# Patient Record
Sex: Female | Born: 1985 | Race: Black or African American | Hispanic: No | Marital: Single | Smoking: Never smoker
Health system: Southern US, Community
[De-identification: ages and names within clinical notes are randomized; demographics above are authoritative.]

## PROBLEM LIST (undated history)

## (undated) ENCOUNTER — Inpatient Hospital Stay (HOSPITAL_COMMUNITY): Payer: Self-pay

## (undated) DIAGNOSIS — R519 Headache, unspecified: Secondary | ICD-10-CM

## (undated) DIAGNOSIS — E039 Hypothyroidism, unspecified: Secondary | ICD-10-CM

## (undated) DIAGNOSIS — B999 Unspecified infectious disease: Secondary | ICD-10-CM

## (undated) DIAGNOSIS — A749 Chlamydial infection, unspecified: Secondary | ICD-10-CM

## (undated) DIAGNOSIS — Z9889 Other specified postprocedural states: Secondary | ICD-10-CM

## (undated) DIAGNOSIS — R112 Nausea with vomiting, unspecified: Secondary | ICD-10-CM

## (undated) DIAGNOSIS — R51 Headache: Secondary | ICD-10-CM

## (undated) DIAGNOSIS — N750 Cyst of Bartholin's gland: Secondary | ICD-10-CM

## (undated) DIAGNOSIS — E0590 Thyrotoxicosis, unspecified without thyrotoxic crisis or storm: Secondary | ICD-10-CM

## (undated) HISTORY — PX: INDUCED ABORTION: SHX677

## (undated) HISTORY — DX: Thyrotoxicosis, unspecified without thyrotoxic crisis or storm: E05.90

## (undated) HISTORY — PX: DILATION AND CURETTAGE OF UTERUS: SHX78

## (undated) HISTORY — DX: Hypothyroidism, unspecified: E03.9

---
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
## 2007-07-11 ENCOUNTER — Emergency Department (HOSPITAL_COMMUNITY): Admission: EM | Admit: 2007-07-11 | Discharge: 2007-07-11 | Payer: Self-pay | Admitting: Family Medicine

## 2008-03-14 ENCOUNTER — Emergency Department (HOSPITAL_COMMUNITY): Admission: EM | Admit: 2008-03-14 | Discharge: 2008-03-15 | Payer: Self-pay | Admitting: Emergency Medicine

## 2008-03-27 ENCOUNTER — Emergency Department (HOSPITAL_COMMUNITY): Admission: EM | Admit: 2008-03-27 | Discharge: 2008-03-28 | Payer: Self-pay | Admitting: Emergency Medicine

## 2008-03-28 ENCOUNTER — Emergency Department (HOSPITAL_COMMUNITY): Admission: EM | Admit: 2008-03-28 | Discharge: 2008-03-28 | Payer: Self-pay | Admitting: Emergency Medicine

## 2008-04-12 ENCOUNTER — Ambulatory Visit: Payer: Self-pay | Admitting: Obstetrics & Gynecology

## 2008-04-13 ENCOUNTER — Ambulatory Visit (HOSPITAL_COMMUNITY): Admission: RE | Admit: 2008-04-13 | Discharge: 2008-04-13 | Payer: Self-pay | Admitting: Obstetrics & Gynecology

## 2008-05-14 ENCOUNTER — Emergency Department (HOSPITAL_COMMUNITY): Admission: EM | Admit: 2008-05-14 | Discharge: 2008-05-14 | Payer: Self-pay | Admitting: Emergency Medicine

## 2008-06-13 ENCOUNTER — Inpatient Hospital Stay (HOSPITAL_COMMUNITY): Admission: AD | Admit: 2008-06-13 | Discharge: 2008-06-13 | Payer: Self-pay | Admitting: Obstetrics & Gynecology

## 2008-06-21 ENCOUNTER — Ambulatory Visit (HOSPITAL_COMMUNITY): Admission: RE | Admit: 2008-06-21 | Discharge: 2008-06-21 | Payer: Self-pay | Admitting: Family Medicine

## 2008-08-08 ENCOUNTER — Inpatient Hospital Stay (HOSPITAL_COMMUNITY): Admission: AD | Admit: 2008-08-08 | Discharge: 2008-08-08 | Payer: Self-pay | Admitting: Obstetrics and Gynecology

## 2008-08-14 ENCOUNTER — Ambulatory Visit (HOSPITAL_COMMUNITY): Admission: RE | Admit: 2008-08-14 | Discharge: 2008-08-14 | Payer: Self-pay | Admitting: Family Medicine

## 2008-10-26 ENCOUNTER — Inpatient Hospital Stay (HOSPITAL_COMMUNITY): Admission: AD | Admit: 2008-10-26 | Discharge: 2008-10-26 | Payer: Self-pay | Admitting: Family Medicine

## 2008-11-20 ENCOUNTER — Ambulatory Visit: Payer: Self-pay | Admitting: Obstetrics and Gynecology

## 2008-11-20 ENCOUNTER — Inpatient Hospital Stay (HOSPITAL_COMMUNITY): Admission: AD | Admit: 2008-11-20 | Discharge: 2008-11-23 | Payer: Self-pay | Admitting: Obstetrics & Gynecology

## 2009-04-21 IMAGING — US US OB TRANSVAGINAL
1 series · 14 of 28 positions shown · non-contrast
Comparison: none

OBSTETRICAL ULTRASOUND:
 This ultrasound exam was performed in the [HOSPITAL] Ultrasound Department.  The OB US report was generated in the AS system, and faxed to the ordering physician.  This report is also available in [REDACTED] PACS.

[Series 1: us ob transvaginal · 50 acquisitions, 14 frames shown]
[im 2/50]
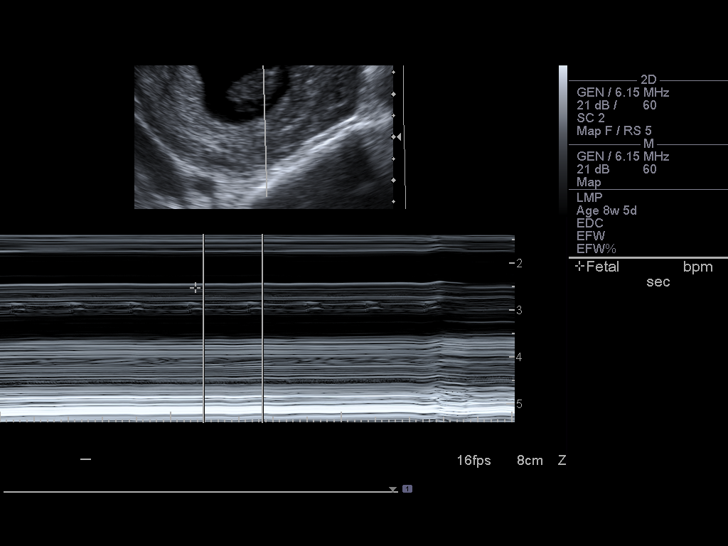
[im 6/50]
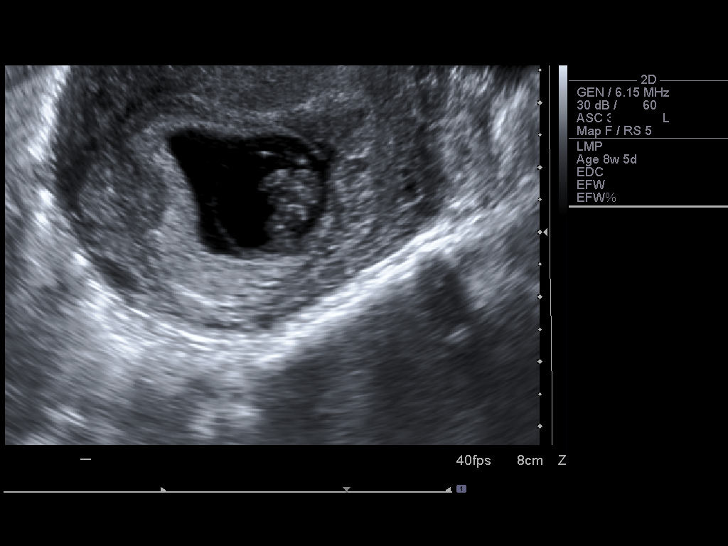
[im 10/50]
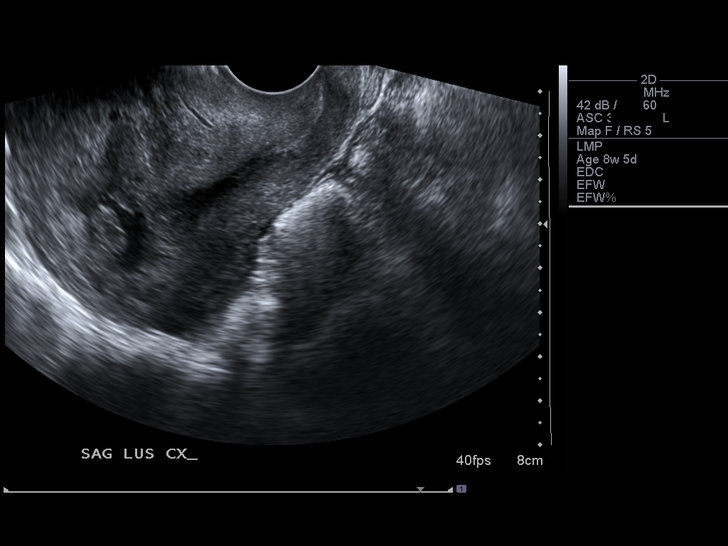
[im 13/50]
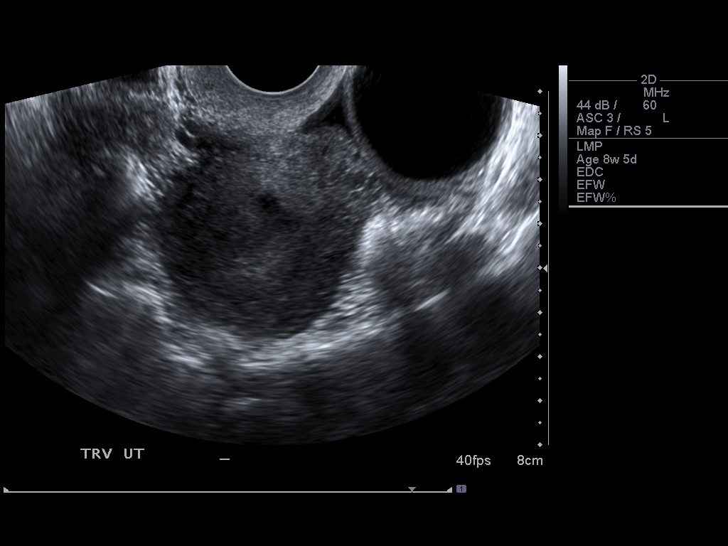
[im 17/50]
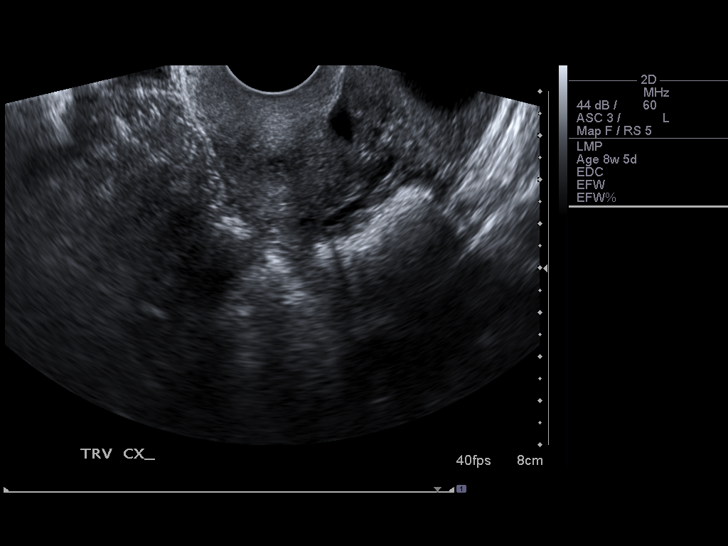
[im 20/50]
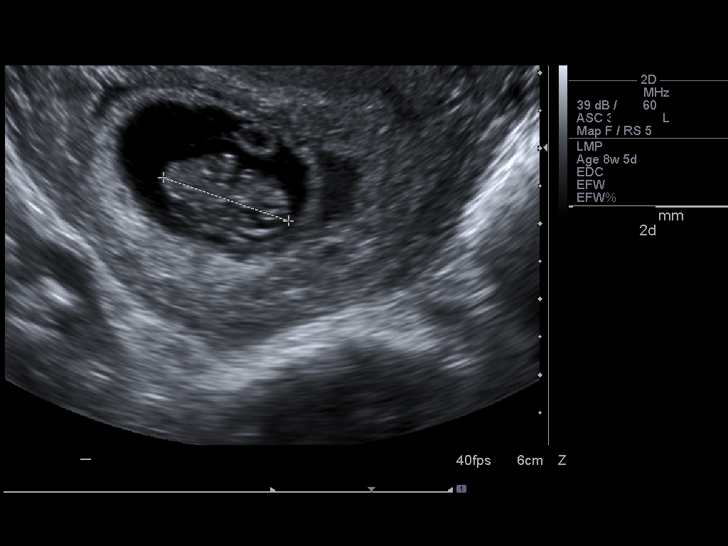
[im 24/50]
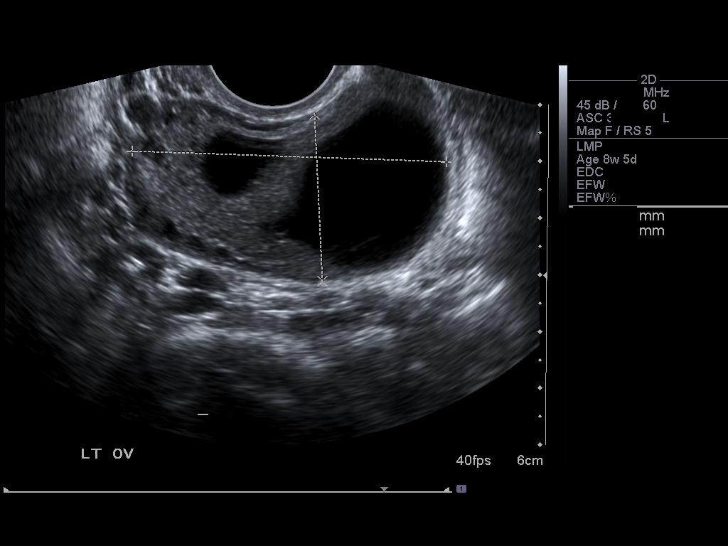
[im 28/50]
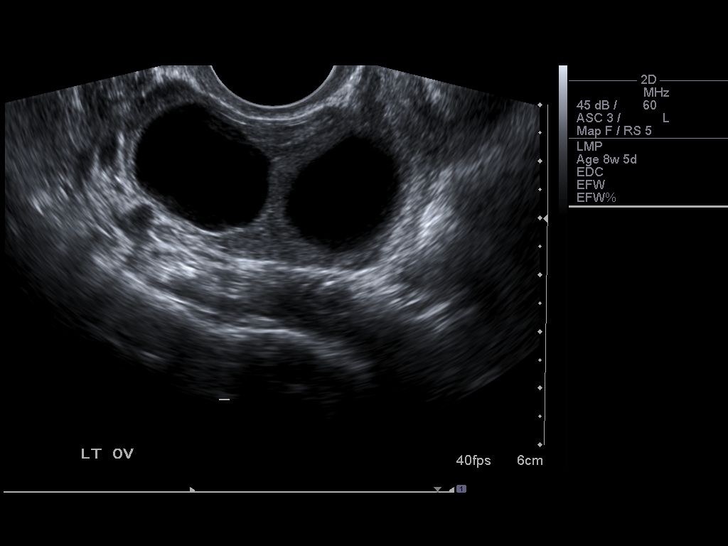
[im 31/50]
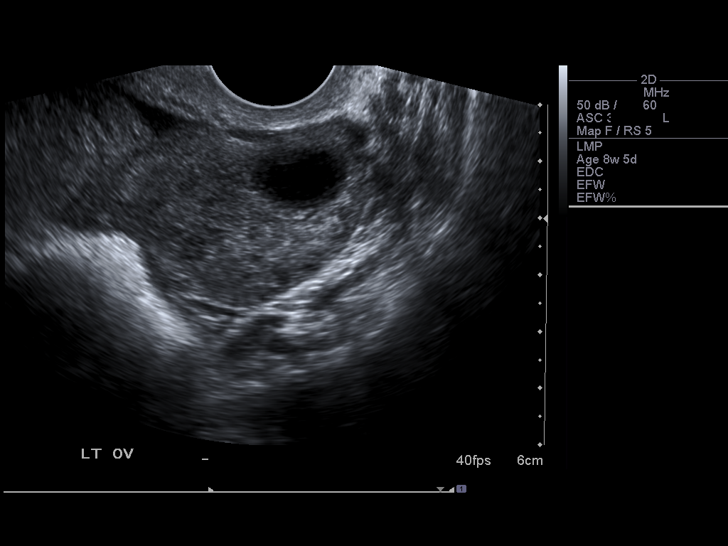
[im 35/50]
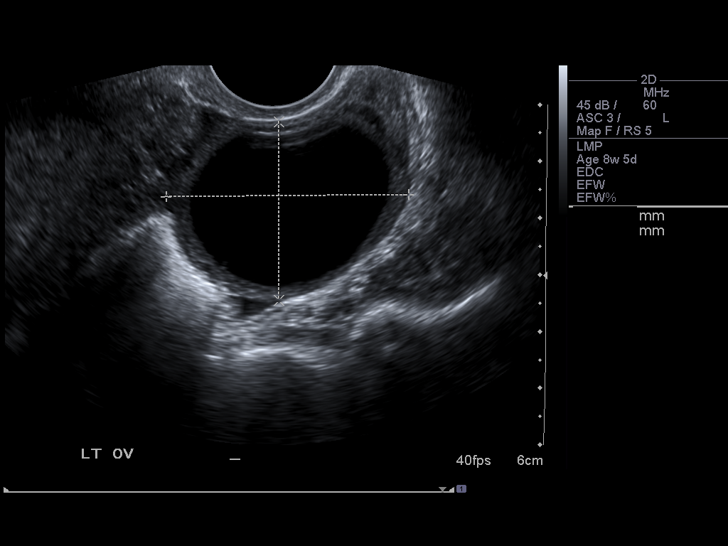
[im 39/50]
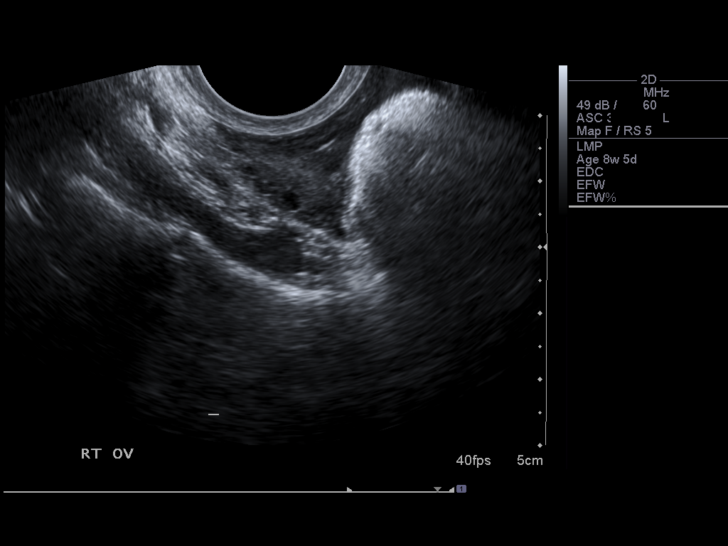
[im 42/50]
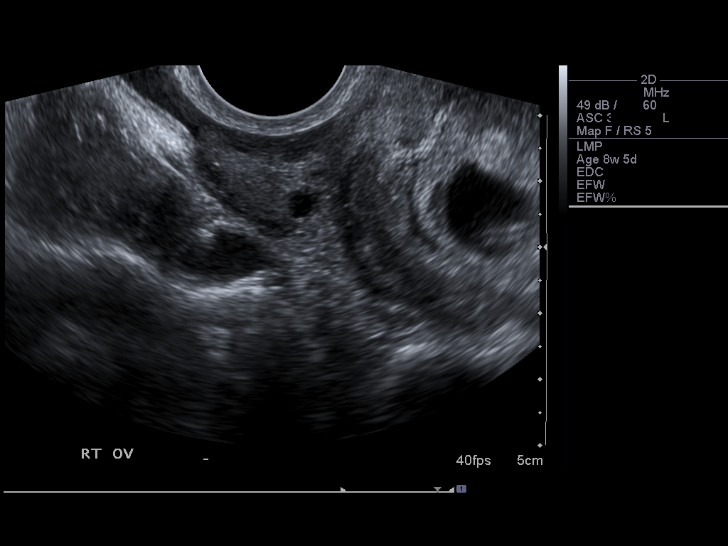
[im 46/50]
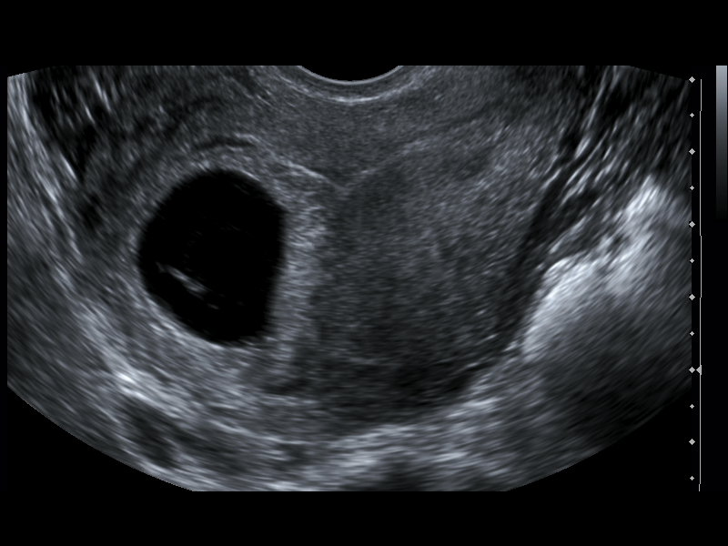
[im 50/50]
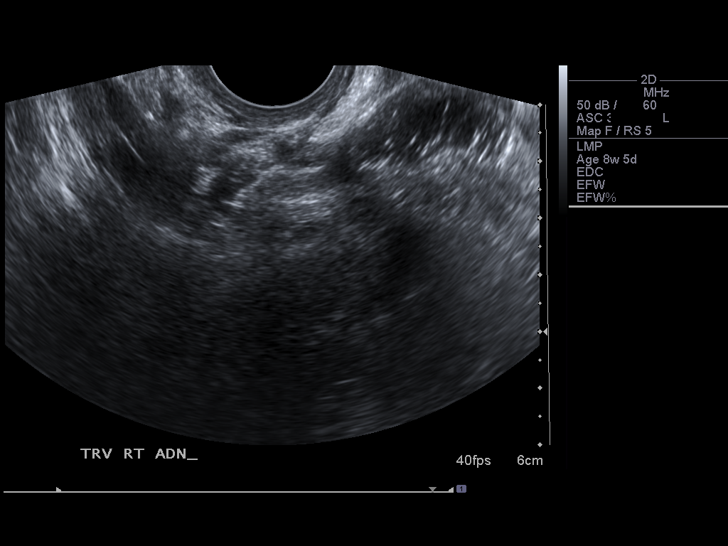

[14 of 28 positions shown; findings below may reference images not displayed]

IMPRESSION: See AS Obstetric US report.

## 2010-01-30 ENCOUNTER — Emergency Department (HOSPITAL_COMMUNITY): Admission: EM | Admit: 2010-01-30 | Discharge: 2010-01-30 | Payer: Self-pay | Admitting: Emergency Medicine

## 2010-03-12 ENCOUNTER — Emergency Department (HOSPITAL_COMMUNITY): Admission: EM | Admit: 2010-03-12 | Discharge: 2010-03-12 | Payer: Self-pay | Admitting: Emergency Medicine

## 2010-08-01 ENCOUNTER — Emergency Department (HOSPITAL_COMMUNITY)
Admission: EM | Admit: 2010-08-01 | Discharge: 2010-08-02 | Payer: Self-pay | Source: Home / Self Care | Admitting: Emergency Medicine

## 2010-09-21 ENCOUNTER — Encounter (HOSPITAL_COMMUNITY): Payer: Self-pay

## 2010-09-21 ENCOUNTER — Emergency Department (HOSPITAL_COMMUNITY): Payer: Self-pay

## 2010-09-21 ENCOUNTER — Emergency Department (HOSPITAL_COMMUNITY)
Admission: EM | Admit: 2010-09-21 | Discharge: 2010-09-22 | Disposition: A | Discharge status: Other Acute Inpatient Hospital | Payer: Self-pay | Attending: Emergency Medicine | Admitting: Emergency Medicine

## 2010-09-21 DIAGNOSIS — R112 Nausea with vomiting, unspecified: Secondary | ICD-10-CM | POA: Insufficient documentation

## 2010-09-21 DIAGNOSIS — O99891 Other specified diseases and conditions complicating pregnancy: Secondary | ICD-10-CM | POA: Insufficient documentation

## 2010-09-21 DIAGNOSIS — R109 Unspecified abdominal pain: Secondary | ICD-10-CM | POA: Insufficient documentation

## 2010-09-21 LAB — DIFFERENTIAL
Basophils Relative: 0 % (ref 0–1)
Lymphs Abs: 1.7 10*3/uL (ref 0.7–4.0)
Monocytes Absolute: 0.8 10*3/uL (ref 0.1–1.0)
Neutro Abs: 10.7 10*3/uL — ABNORMAL HIGH (ref 1.7–7.7)
Neutrophils Relative %: 81 % — ABNORMAL HIGH (ref 43–77)

## 2010-09-21 LAB — URINALYSIS, ROUTINE W REFLEX MICROSCOPIC
Ketones, ur: >80 mg/dL — AB
Protein, ur: NEGATIVE mg/dL
Specific Gravity, Urine: 1.036 — ABNORMAL HIGH (ref 1.005–1.030)
Urine Glucose, Fasting: NEGATIVE mg/dL

## 2010-09-21 LAB — CBC
HCT: 35.8 % — ABNORMAL LOW (ref 36.0–46.0)
MCHC: 35.8 g/dL (ref 30.0–36.0)
MCV: 86.1 fL (ref 78.0–100.0)
Platelets: 258 10*3/uL (ref 150–400)
RBC: 4.16 MIL/uL (ref 3.87–5.11)
RDW: 12.2 % (ref 11.5–15.5)
WBC: 13.2 10*3/uL — ABNORMAL HIGH (ref 4.0–10.5)

## 2010-09-21 LAB — HCG, QUANTITATIVE, PREGNANCY: hCG, Beta Chain, Quant, S: 18108 m[IU]/mL — ABNORMAL HIGH (ref <5)

## 2010-09-21 LAB — HEPATIC FUNCTION PANEL
ALT: 16 U/L (ref 0–35)
Albumin: 4.2 g/dL (ref 3.5–5.2)
Total Protein: 7.5 g/dL (ref 6.0–8.3)

## 2010-09-21 LAB — POCT I-STAT, CHEM 8
Chloride: 104 mEq/L (ref 96–112)
Creatinine, Ser: 0.7 mg/dL (ref 0.4–1.2)
Hemoglobin: 13.6 g/dL (ref 12.0–15.0)
Sodium: 139 mEq/L (ref 135–145)

## 2010-09-21 LAB — LIPASE, BLOOD: Lipase: 16 U/L (ref 11–59)

## 2010-09-21 LAB — POCT PREGNANCY, URINE: Preg Test, Ur: POSITIVE

## 2010-09-21 LAB — WET PREP, GENITAL
Trich, Wet Prep: NONE SEEN
Yeast Wet Prep HPF POC: NONE SEEN

## 2010-09-22 ENCOUNTER — Inpatient Hospital Stay (HOSPITAL_COMMUNITY)
Admission: AD | Admit: 2010-09-22 | Discharge: 2010-09-22 | Disposition: A | Discharge status: Home / Self Care | Payer: Self-pay | Source: Other Acute Inpatient Hospital | Attending: Obstetrics and Gynecology | Admitting: Obstetrics and Gynecology

## 2010-09-22 ENCOUNTER — Inpatient Hospital Stay (HOSPITAL_COMMUNITY): Payer: Self-pay

## 2010-09-22 DIAGNOSIS — R109 Unspecified abdominal pain: Secondary | ICD-10-CM | POA: Insufficient documentation

## 2010-09-22 DIAGNOSIS — O99891 Other specified diseases and conditions complicating pregnancy: Secondary | ICD-10-CM | POA: Insufficient documentation

## 2010-09-22 LAB — URINE CULTURE

## 2010-09-25 ENCOUNTER — Inpatient Hospital Stay (HOSPITAL_COMMUNITY)
Admission: AD | Admit: 2010-09-25 | Discharge: 2010-09-25 | Disposition: A | Discharge status: Home / Self Care | Payer: Self-pay | Source: Ambulatory Visit | Attending: Obstetrics & Gynecology | Admitting: Obstetrics & Gynecology

## 2010-09-25 DIAGNOSIS — O99891 Other specified diseases and conditions complicating pregnancy: Secondary | ICD-10-CM | POA: Insufficient documentation

## 2010-09-25 DIAGNOSIS — O9989 Other specified diseases and conditions complicating pregnancy, childbirth and the puerperium: Secondary | ICD-10-CM

## 2010-09-25 DIAGNOSIS — R109 Unspecified abdominal pain: Secondary | ICD-10-CM | POA: Insufficient documentation

## 2010-09-25 LAB — URINALYSIS, ROUTINE W REFLEX MICROSCOPIC
Bilirubin Urine: NEGATIVE
Ketones, ur: NEGATIVE mg/dL
Specific Gravity, Urine: 1.01 (ref 1.005–1.030)

## 2010-09-25 LAB — URINE MICROSCOPIC-ADD ON

## 2010-09-26 LAB — URINE CULTURE
Colony Count: >=100000
Culture  Setup Time: 201202091258

## 2010-10-27 LAB — URINALYSIS, ROUTINE W REFLEX MICROSCOPIC
Glucose, UA: NEGATIVE mg/dL
Hgb urine dipstick: NEGATIVE
Ketones, ur: NEGATIVE mg/dL
pH: 6 (ref 5.0–8.0)

## 2010-10-27 LAB — WET PREP, GENITAL: Yeast Wet Prep HPF POC: NONE SEEN

## 2010-10-27 LAB — URINE MICROSCOPIC-ADD ON

## 2010-11-01 LAB — BASIC METABOLIC PANEL
BUN: 7 mg/dL (ref 6–23)
Calcium: 9.4 mg/dL (ref 8.4–10.5)
GFR calc non Af Amer: >60 mL/min (ref >60)
Glucose, Bld: 104 mg/dL — ABNORMAL HIGH (ref 70–99)

## 2010-11-01 LAB — DIFFERENTIAL
Eosinophils Absolute: 0 10*3/uL (ref 0.0–0.7)
Eosinophils Relative: 0 % (ref 0–5)
Lymphocytes Relative: 18 % (ref 12–46)
Lymphs Abs: 2.1 10*3/uL (ref 0.7–4.0)
Monocytes Relative: 5 % (ref 3–12)

## 2010-11-01 LAB — HCG, QUANTITATIVE, PREGNANCY: hCG, Beta Chain, Quant, S: 1423 m[IU]/mL — ABNORMAL HIGH (ref <5)

## 2010-11-01 LAB — CBC: MCHC: 34.3 g/dL (ref 30.0–36.0)

## 2010-11-26 LAB — CBC
HCT: 26.9 % — ABNORMAL LOW (ref 36.0–46.0)
HCT: 33.4 % — ABNORMAL LOW (ref 36.0–46.0)
Hemoglobin: 10.6 g/dL — ABNORMAL LOW (ref 12.0–15.0)
Hemoglobin: 11.4 g/dL — ABNORMAL LOW (ref 12.0–15.0)
Hemoglobin: 9.1 g/dL — ABNORMAL LOW (ref 12.0–15.0)
MCHC: 33.8 g/dL (ref 30.0–36.0)
MCHC: 34.1 g/dL (ref 30.0–36.0)
MCHC: 34.3 g/dL (ref 30.0–36.0)
MCV: 93.7 fL (ref 78.0–100.0)
MCV: 94.3 fL (ref 78.0–100.0)
MCV: 95.6 fL (ref 78.0–100.0)
Platelets: 102 10*3/uL — ABNORMAL LOW (ref 150–400)
Platelets: 183 10*3/uL (ref 150–400)
RBC: 3.31 MIL/uL — ABNORMAL LOW (ref 3.87–5.11)
RDW: 12.2 % (ref 11.5–15.5)
RDW: 12.5 % (ref 11.5–15.5)
RDW: 12.6 % (ref 11.5–15.5)
RDW: 12.9 % (ref 11.5–15.5)

## 2010-11-26 LAB — DIFFERENTIAL
Basophils Absolute: 0 10*3/uL (ref 0.0–0.1)
Basophils Relative: 0 % (ref 0–1)
Eosinophils Absolute: 0 10*3/uL (ref 0.0–0.7)
Monocytes Absolute: 0 10*3/uL — ABNORMAL LOW (ref 0.1–1.0)
Monocytes Relative: 0 % — ABNORMAL LOW (ref 3–12)
Neutro Abs: 2.9 10*3/uL (ref 1.7–7.7)
Neutrophils Relative %: 92 % — ABNORMAL HIGH (ref 43–77)

## 2010-11-26 LAB — RH IMMUNE GLOB WKUP(>/=20WKS)(NOT WOMEN'S HOSP): Fetal Screen: NEGATIVE

## 2010-12-30 NOTE — Op Note (Signed)
Joyce Rice, Joyce Rice NO.:  [id number]   MEDICAL RECORD NO.:  [id number]          PATIENT TYPE:  INP   LOCATION:  9117                          FACILITY:  [facility]   PHYSICIAN:  Tilda Burrow, M.D. DATE OF BIRTH:  1986-02-21   DATE OF PROCEDURE:  11/21/2008  DATE OF DISCHARGE:                               OPERATIVE REPORT   I called to see the patient regarding prolonged fetal heart rate  decelerations with recovery after approximately 3 minutes.  The patient  was counseled over by Ms. Williams regarding vacuum assistance and  confirmed by me.  The patient was examined and found to be in a right  occipito-anterior position and Foley catheter removed.  Vacuum  assistance with Kiwi vacuum was offered and accepted by the patient and  placed on the vertex with careful positioning.  Vacuum assistance was  applied upon the onset of the contraction to thegreen line and vacuum  needle utilized through one full contraction, two prolonged pushes by  the patient who did most of the work.  The baby crowned nicely and  delivered over a second degree midline laceration.  Once the head was  delivered, the vacuum was removed.  There had been no pop-offs.  The  shoulders were checked and the baby was found to be in a transverse  position with the shoulders.  The infant's right shoulder was identified  and while the mother pushed the shoulders rotated so that the right arm  could be released from an anterior position and the rest of the baby  delivered easily.  The infant was placed on the maternal abdomen, cord  clamped and then cut by the baby's father who was in attendance.  Placenta was delivered by Crede uterine massage after approximately 5  minutes.  Apgars were __________ .  Weight was __________ .  The patient  had local anesthesia infiltrated to ensure effective analgesia.  The  periurethral first degree lacerations did not require repair.  The  secondary  midline  laceration was repaired using 3-0 Monocryl in a  continuous running two-layer fashion.  Just before completing the end of  the closure, it was noted that purulent material was encountered with a  last stitch coming from the patient's left side.  The needle was cut  off, the suture unwound from the last suture placement on the left side,  so that the suturing did not involve the Bartholin cyst and then the  episiotomy tied down in standard fashion with good tissue approximation.   Incision and drainage of the Bartholin cyst then followed.  Palpation  revealed poorly defined fullness on the left side, which was obviously  from the Bartholin cyst that was relatively asymptomatic but contained  purulent material.  A 5 mL of lidocaine was injected into the body of  the Bartholin cyst in order to improve its visibility.  It was clearly  definable at that point and so under a wheal of local anesthesia at the  left hymen remnant, a stab incision with #11 blade was made into the  cyst, expressing purulence and an old  coagulated debris.  This was  expressed and Kelly clamp could be used to probe the depth of the cyst.  Word catheter was tested, inflated, and inserted and 1 mL of lidocaine  injected into the Word catheter to keep it in place.  The catheter  rested comfortably and slipped up into the vagina in standard position.  The patient tolerated the procedure well.  Upon completion of the  procedure, we were temporarily unable to locate the needle from the 3-0  Monocryl and we were able to identify it in the draping.  Rest of the  sponges were accounted for as well as the remainder of the sharps.  The  patient was in stable condition with EBL of 350 mL.      Tilda Burrow, M.D.  Electronically Signed     JVF/MEDQ  D:  11/21/2008  T:  11/22/2008  Job:  [id number]

## 2010-12-30 NOTE — Group Therapy Note (Signed)
Joyce Rice, Joyce Rice NO.:  [id number]   MEDICAL RECORD NO.:  [id number]          PATIENT TYPE:  WOC   LOCATION:  [facility]                   FACILITY:  [facility]   PHYSICIAN:  Johnella Moloney, MD        DATE OF BIRTH:  10/10/1985   DATE OF SERVICE:                                  CLINIC NOTE   The patient is a 25 year old gravida 1 with last menstrual period of  February 12, 2008, who was seen in the [facility] Emergency Room on March 28, 2008, for abdominal pain.  Subsequent evaluation revealed that the  patient had a positive pregnancy test with quantitative HCG level of  9499.  A follow-up ultrasound was ordered and was remarkable for a  cystic structure within the uterus compatible with a gestational sac,  but no embryo or yolk sac was identified.  Findings could be due to  early pregnancy or fetal demise.  Follow-up ultrasound was recommended.  The patient was told to follow up here at the GYN clinic for further  evaluation.  Of note, the patient just had the 1 episode of abdominal  pain on March 28, 2008.  She has had no bleeding.  On evaluation, she  was noted to have an O negative blood type and did receive RhoGAM 50 mcg  IM on March 28, 2008.  She had no medical problems, no surgeries, and  no other acute gynecological issues.  Her last Pap smear was done on  March 22, 2008, and was normal.   On encounter today, the patient denies any abdominal pain or bleeding.  She does seem confused about what her diagnosis is at this point.  The  patient was told that at the quantitative HCG level that it is expected  that there should be more than a gestational sac identified on the  ultrasound.  However, early normal pregnancy cannot be excluded.  Given  this, we will repeat quantitative HCG level today and order another  ultrasound.  If these studies do point to missed abortion, the patient  was told that she will be offered misoprostol versus a D&C for  management of  this.  However, we will follow up with ultrasound studies  and a beta HCG level before these discussions are had in detail.  The  patient will follow up within 1 week for discussion of results and  further evaluation.           ______________________________  Johnella Moloney, MD     UD/MEDQ  D:  04/12/2008  T:  04/12/2008  Job:  [id number]

## 2011-05-15 LAB — URINALYSIS, ROUTINE W REFLEX MICROSCOPIC
Bilirubin Urine: NEGATIVE
Glucose, UA: NEGATIVE
Hgb urine dipstick: NEGATIVE
Ketones, ur: NEGATIVE
Protein, ur: NEGATIVE

## 2011-05-15 LAB — HCG, QUANTITATIVE, PREGNANCY: hCG, Beta Chain, Quant, S: 9499 — ABNORMAL HIGH

## 2011-05-15 LAB — RHOGAM INJECTION

## 2011-05-15 LAB — URINE MICROSCOPIC-ADD ON

## 2011-05-15 LAB — ABO/RH: ABO/RH(D): O NEG

## 2011-05-18 LAB — URINALYSIS, ROUTINE W REFLEX MICROSCOPIC
Bilirubin Urine: NEGATIVE
Glucose, UA: NEGATIVE
Protein, ur: 100 — AB

## 2011-05-18 LAB — WET PREP, GENITAL: Clue Cells Wet Prep HPF POC: NONE SEEN

## 2011-05-18 LAB — URINE MICROSCOPIC-ADD ON

## 2011-05-18 LAB — GC/CHLAMYDIA PROBE AMP, GENITAL
Chlamydia, DNA Probe: NEGATIVE
GC Probe Amp, Genital: NEGATIVE

## 2011-05-22 LAB — URINALYSIS, ROUTINE W REFLEX MICROSCOPIC
Bilirubin Urine: NEGATIVE
Hgb urine dipstick: NEGATIVE
Ketones, ur: NEGATIVE mg/dL
Nitrite: NEGATIVE
Protein, ur: 30 mg/dL — AB
Specific Gravity, Urine: 1.015 (ref 1.005–1.030)
Urobilinogen, UA: 0.2 mg/dL (ref 0.0–1.0)

## 2011-05-22 LAB — WET PREP, GENITAL
Clue Cells Wet Prep HPF POC: NONE SEEN
Trich, Wet Prep: NONE SEEN
Yeast Wet Prep HPF POC: NONE SEEN

## 2011-05-22 LAB — GC/CHLAMYDIA PROBE AMP, GENITAL
Chlamydia, DNA Probe: NEGATIVE
GC Probe Amp, Genital: NEGATIVE

## 2011-05-26 LAB — WET PREP, GENITAL
Clue Cells Wet Prep HPF POC: NONE SEEN
Trich, Wet Prep: NONE SEEN
WBC, Wet Prep HPF POC: NONE SEEN
Yeast Wet Prep HPF POC: NONE SEEN

## 2011-05-26 LAB — POCT URINALYSIS DIP (DEVICE)
Glucose, UA: NEGATIVE
Ketones, ur: 15 — AB
Nitrite: NEGATIVE
Operator id: 116391
Protein, ur: 30 — AB
Specific Gravity, Urine: 1.02
Urobilinogen, UA: 2 — ABNORMAL HIGH
pH: 5.5

## 2011-05-26 LAB — GC/CHLAMYDIA PROBE AMP, GENITAL
Chlamydia, DNA Probe: NEGATIVE
GC Probe Amp, Genital: NEGATIVE

## 2011-05-26 LAB — POCT PREGNANCY, URINE
Operator id: 116391
Preg Test, Ur: NEGATIVE

## 2011-06-19 ENCOUNTER — Encounter (HOSPITAL_COMMUNITY): Payer: Self-pay | Admitting: *Deleted

## 2011-06-19 ENCOUNTER — Inpatient Hospital Stay (HOSPITAL_COMMUNITY)
Admission: AD | Admit: 2011-06-19 | Discharge: 2011-06-19 | Disposition: A | Discharge status: Home / Self Care | Payer: Medicaid Other | Source: Ambulatory Visit | Attending: Obstetrics and Gynecology | Admitting: Obstetrics and Gynecology

## 2011-06-19 DIAGNOSIS — N926 Irregular menstruation, unspecified: Secondary | ICD-10-CM | POA: Insufficient documentation

## 2011-06-19 DIAGNOSIS — N760 Acute vaginitis: Secondary | ICD-10-CM | POA: Insufficient documentation

## 2011-06-19 DIAGNOSIS — A499 Bacterial infection, unspecified: Secondary | ICD-10-CM | POA: Insufficient documentation

## 2011-06-19 DIAGNOSIS — B9689 Other specified bacterial agents as the cause of diseases classified elsewhere: Secondary | ICD-10-CM | POA: Insufficient documentation

## 2011-06-19 DIAGNOSIS — R109 Unspecified abdominal pain: Secondary | ICD-10-CM | POA: Insufficient documentation

## 2011-06-19 HISTORY — DX: Cyst of Bartholin's gland: N75.0

## 2011-06-19 LAB — URINALYSIS, ROUTINE W REFLEX MICROSCOPIC
Bilirubin Urine: NEGATIVE
Hgb urine dipstick: NEGATIVE
Nitrite: NEGATIVE
Protein, ur: NEGATIVE mg/dL
Specific Gravity, Urine: >1.03 — ABNORMAL HIGH (ref 1.005–1.030)
Urobilinogen, UA: 0.2 mg/dL (ref 0.0–1.0)

## 2011-06-19 LAB — URINE MICROSCOPIC-ADD ON

## 2011-06-19 LAB — WET PREP, GENITAL: Yeast Wet Prep HPF POC: NONE SEEN

## 2011-06-19 MED ORDER — METRONIDAZOLE 500 MG PO TABS: 500.0000 mg | ORAL_TABLET | Freq: Two times a day (BID) | ORAL | Status: AC

## 2011-06-19 MED ORDER — METRONIDAZOLE 500 MG PO TABS: 500.0000 mg | ORAL_TABLET | Freq: Two times a day (BID) | ORAL | Status: DC

## 2011-06-19 NOTE — ED Provider Notes (Signed)
[place] Hinson25 [id number] @Unknown  Chief Complaint  Patient presents with  . Abdominal Pain  . Back Pain    SUBJECTIVE  HPI: Concerned that she missed menses 06/04/11 at end of 3rd week of Loestrin pack. Did not miss pills but did have some vomiting during Oct. Menses light and regular since starting last March. HPT was neg. Since the time of her missed period, she reports intermittent sharp suprapubic pains that radiate to her back. She is not having pain now. Last intercourse was early in October. Denies urinary frequency, urgency, hematuria, dysuria. Denies irritating vaginal discharge.    Past Medical History  Diagnosis Date  . Bartholin cyst     Past Surgical History  Procedure Date  . Dilation and curettage of uterus   . No past surgeries    History   Social History  . Marital Status: Single    Spouse Name: N/A    Number of Children: N/A  . Years of Education: N/A   Occupational History  . Not on file.   Social History Main Topics  . Smoking status: Never Smoker   . Smokeless tobacco: Not on file  . Alcohol Use: No  . Drug Use: No  . Sexually Active: Yes    Birth Control/ Protection: Pill   Other Topics Concern  . Not on file   Social History Narrative  . No narrative on file   No current facility-administered medications on file prior to encounter.   No current outpatient prescriptions on file prior to encounter.   No Known Allergies  ROS: Pertinent items in HPI  OBJECTIVE  BP 116/67  Temp(Src) 98.5 F (36.9 C) (Oral)  Resp 20  Ht 5' 1.5" (1.562 m)  Wt 63.135 kg (139 lb 3 oz)  BMI 25.87 kg/m2  LMP 05/05/2011  Breastfeeding? No   Physical Exam  Constitutional: She is oriented to person, place, and time and well-developed, well-nourished, and in no distress. No distress.  HENT:  Head: Normocephalic.  Eyes: Pupils are equal, round, and reactive to light.  Neck: Neck supple. No thyromegaly present.  Abdominal: Soft. She exhibits no  distension. There is no tenderness. There is no rebound and no guarding.  Genitourinary: Uterus normal, cervix normal, right adnexa normal and left adnexa normal. Vaginal discharge found.  Neurological: She is alert and oriented to person, place, and time.  Skin: Skin is warm and dry.    Results for orders placed during the hospital encounter of 06/19/11 (from the past 24 hour(s))  URINALYSIS, ROUTINE W REFLEX MICROSCOPIC     Status: Abnormal   Collection Time   06/19/11  9:20 PM      Component Value Range   Color, Urine YELLOW  YELLOW    Appearance CLEAR  CLEAR    Specific Gravity, Urine >1.030 (*) 1.005 - 1.030    pH 5.5  5.0 - 8.0    Glucose, UA NEGATIVE  NEGATIVE (mg/dL)   Hgb urine dipstick NEGATIVE  NEGATIVE    Bilirubin Urine NEGATIVE  NEGATIVE    Ketones, ur 15 (*) NEGATIVE (mg/dL)   Protein, ur NEGATIVE  NEGATIVE (mg/dL)   Urobilinogen, UA 0.2  0.0 - 1.0 (mg/dL)   Nitrite NEGATIVE  NEGATIVE    Leukocytes, UA TRACE (*) NEGATIVE   URINE MICROSCOPIC-ADD ON     Status: Abnormal   Collection Time   06/19/11  9:20 PM      Component Value Range   Squamous Epithelial / LPF FEW (*) RARE  WBC, UA 0-2  <3 (WBC/hpf)   Bacteria, UA RARE  RARE    Urine-Other MUCOUS PRESENT    POCT PREGNANCY, URINE     Status: Normal   Collection Time   06/19/11  9:22 PM      Component Value Range   Preg Test, Ur NEGATIVE    WET PREP, GENITAL     Status: Abnormal   Collection Time   06/19/11  9:55 PM      Component Value Range   Yeast, Wet Prep NONE SEEN  NONE SEEN    Trich, Wet Prep NONE SEEN  NONE SEEN    Clue Cells, Wet Prep MODERATE (*) NONE SEEN    WBC, Wet Prep HPF POC MODERATE (*) NONE SEEN    ASSESSMENT  Irregular menstrual cycle BV  PLAN Rx Flagyl She elects to remain abstinent until physiologic onset of menses. We will make an appointment in GYN clinic in 4 weeks and she can cancel if she has menses and is back on OCPs.

## 2011-06-19 NOTE — Progress Notes (Signed)
Written and verbal d/c instructions given and understanding voiced. Has script for Flagyl

## 2011-06-19 NOTE — Progress Notes (Signed)
Pt states, " I missed my period on Oct 18th, and ever since then I've had pain in my lower abdomen and mid and low back."

## 2011-06-19 NOTE — ED Notes (Signed)
Pt vomited. States thinks it's due to her not eating recently. She worked last pm, ate alittle this am, slept and then ate few pretzels when she got up. Saltines to pt. Had been drinking diet ginger ale earlier. States feels better now.

## 2011-06-19 NOTE — Progress Notes (Signed)
D. Poe CNM in to discuss wet prep results and plan for d/c to home.

## 2011-06-20 LAB — GC/CHLAMYDIA PROBE AMP, GENITAL: GC Probe Amp, Genital: NEGATIVE

## 2011-08-05 ENCOUNTER — Encounter: Payer: Medicaid Other | Admitting: Advanced Practice Midwife

## 2011-09-03 ENCOUNTER — Ambulatory Visit (INDEPENDENT_AMBULATORY_CARE_PROVIDER_SITE_OTHER): Payer: Medicaid Other | Admitting: Obstetrics and Gynecology

## 2011-09-03 ENCOUNTER — Encounter: Payer: Self-pay | Admitting: Obstetrics and Gynecology

## 2011-09-03 ENCOUNTER — Other Ambulatory Visit (HOSPITAL_COMMUNITY)
Admission: RE | Admit: 2011-09-03 | Discharge: 2011-09-03 | Disposition: A | Discharge status: Home / Self Care | Payer: Medicaid Other | Source: Ambulatory Visit | Attending: Obstetrics and Gynecology | Admitting: Obstetrics and Gynecology

## 2011-09-03 DIAGNOSIS — Z3042 Encounter for surveillance of injectable contraceptive: Secondary | ICD-10-CM

## 2011-09-03 DIAGNOSIS — N760 Acute vaginitis: Secondary | ICD-10-CM

## 2011-09-03 DIAGNOSIS — Z309 Encounter for contraceptive management, unspecified: Secondary | ICD-10-CM

## 2011-09-03 DIAGNOSIS — Z01419 Encounter for gynecological examination (general) (routine) without abnormal findings: Secondary | ICD-10-CM | POA: Insufficient documentation

## 2011-09-03 DIAGNOSIS — Z3049 Encounter for surveillance of other contraceptives: Secondary | ICD-10-CM

## 2011-09-03 DIAGNOSIS — B9689 Other specified bacterial agents as the cause of diseases classified elsewhere: Secondary | ICD-10-CM | POA: Insufficient documentation

## 2011-09-03 DIAGNOSIS — A499 Bacterial infection, unspecified: Secondary | ICD-10-CM

## 2011-09-03 DIAGNOSIS — Z113 Encounter for screening for infections with a predominantly sexual mode of transmission: Secondary | ICD-10-CM | POA: Insufficient documentation

## 2011-09-03 LAB — WET PREP, GENITAL: WBC, Wet Prep HPF POC: NONE SEEN

## 2011-09-03 MED ORDER — MEDROXYPROGESTERONE ACETATE 150 MG/ML IM SUSP
150.0000 mg | Freq: Once | INTRAMUSCULAR | Status: AC
  Administered 2011-09-03: 150 mg via INTRAMUSCULAR

## 2011-09-03 NOTE — Progress Notes (Signed)
Joyce Hinson25 [id number] Chief Complaint  Patient presents with  . Menstrual Problem    referral MAU  . Contraception    SUBJECTIVE  HPI: Seen in MAU 06/19/11 due to a single late menses while on Loestrin. Has difficulty remembering to take pills and would like to change to Depoprovera. Had spontaneous onset menses and normal flow 08/23/10 and has begun a new pack of Loestrin on approx  Day4 of pill pack.  BV treated with Flagyl 06/19/11. Also has some vaginal discharge and would like to make sure the BV is resolved.   Past Medical History  Diagnosis Date  . Bartholin cyst   . Irregular periods/menstrual cycles    Past Surgical History  Procedure Date  . Dilation and curettage of uterus   . No past surgeries    History   Social History  . Marital Status: Single    Spouse Name: N/A    Number of Children: N/A  . Years of Education: N/A   Occupational History  . Not on file.   Social History Main Topics  . Smoking status: Never Smoker   . Smokeless tobacco: Never Used  . Alcohol Use: No  . Drug Use: No  . Sexually Active: Yes    Birth Control/ Protection: Pill   Other Topics Concern  . Not on file   Social History Narrative  . No narrative on file   No current outpatient prescriptions on file prior to visit.   No current facility-administered medications on file prior to visit.   No Known Allergies  ROS: Pertinent items in HPI  OBJECTIVE  BP 119/72  Pulse 78  Temp(Src) 97.7 F (36.5 C) (Oral)  Ht 5\' 2"  (1.575 m)  Wt 138 lb 12.8 oz (62.959 kg)  BMI 25.39 kg/m2  LMP 08/24/2011    ASSESSMENT Contraceptive management F/U BV    PLAN WP. Pap/GC/CT sent. If UPT neg will initiate Depoprovera today. Counseled on irregular spotting that may occur. Use condoms for safe sex.

## 2011-09-03 NOTE — Progress Notes (Signed)
States is due for a pap

## 2011-09-03 NOTE — Patient Instructions (Signed)
Place depot medroxyprogesterone acetate injection patient instructions here.  Return every 3 months (12 wks for injection. Use condoms for

## 2011-09-05 ENCOUNTER — Telehealth: Payer: Self-pay | Admitting: Obstetrics and Gynecology

## 2011-09-25 NOTE — Telephone Encounter (Signed)
See TC documentatioin

## 2011-10-21 ENCOUNTER — Telehealth: Payer: Self-pay | Admitting: *Deleted

## 2011-10-21 NOTE — Telephone Encounter (Signed)
Pt left message requesting to speak to Joyce Rice regarding her birth control.

## 2011-10-21 NOTE — Telephone Encounter (Signed)
Returned call to pt and she wanted to know why she has not had a period since her Depo Provera injection was given.  I explained that this was normal. I also stated that it is possible to still have a period or light period as well. Pt voiced understanding. Pt has next injection appt on 11/19/11.

## 2011-11-19 ENCOUNTER — Ambulatory Visit: Payer: Medicaid Other

## 2011-11-30 ENCOUNTER — Ambulatory Visit (INDEPENDENT_AMBULATORY_CARE_PROVIDER_SITE_OTHER): Payer: Medicaid Other | Admitting: Obstetrics and Gynecology

## 2011-11-30 VITALS — BP 117/69 | HR 62 | Ht 62.0 in | Wt 140.2 lb

## 2011-11-30 DIAGNOSIS — Z3049 Encounter for surveillance of other contraceptives: Secondary | ICD-10-CM

## 2011-11-30 MED ORDER — MEDROXYPROGESTERONE ACETATE 150 MG/ML IM SUSP
150.0000 mg | INTRAMUSCULAR | Status: AC
  Administered 2011-11-30 – 2012-02-15 (×2): 150 mg via INTRAMUSCULAR

## 2011-12-29 ENCOUNTER — Encounter (HOSPITAL_COMMUNITY): Payer: Self-pay | Admitting: *Deleted

## 2011-12-29 ENCOUNTER — Inpatient Hospital Stay (HOSPITAL_COMMUNITY)
Admission: AD | Admit: 2011-12-29 | Discharge: 2011-12-29 | Disposition: A | Discharge status: Home / Self Care | Payer: Medicaid Other | Source: Ambulatory Visit | Attending: Obstetrics & Gynecology | Admitting: Obstetrics & Gynecology

## 2011-12-29 DIAGNOSIS — A749 Chlamydial infection, unspecified: Secondary | ICD-10-CM | POA: Insufficient documentation

## 2011-12-29 DIAGNOSIS — N949 Unspecified condition associated with female genital organs and menstrual cycle: Secondary | ICD-10-CM | POA: Insufficient documentation

## 2011-12-29 DIAGNOSIS — N899 Noninflammatory disorder of vagina, unspecified: Secondary | ICD-10-CM

## 2011-12-29 DIAGNOSIS — N898 Other specified noninflammatory disorders of vagina: Secondary | ICD-10-CM

## 2011-12-29 HISTORY — DX: Chlamydial infection, unspecified: A74.9

## 2011-12-29 LAB — WET PREP, GENITAL
Trich, Wet Prep: NONE SEEN
Yeast Wet Prep HPF POC: NONE SEEN

## 2011-12-29 LAB — POCT PREGNANCY, URINE: Preg Test, Ur: NEGATIVE

## 2011-12-29 MED ORDER — TERCONAZOLE 0.4 % VA CREA: 1.0000 | TOPICAL_CREAM | Freq: Every day | VAGINAL | Status: AC

## 2011-12-29 NOTE — Discharge Instructions (Signed)
Vaginitis Vaginitis is an infection. It causes soreness, swelling, and redness (inflammation) of the vagina. Many of these infections are sexually transmitted diseases (STDs). Having unprotected sex can cause further problems and complications such as:  Chronic pelvic pain.   Infertility.   Unwanted pregnancy.   Abortion.   Tubal pregnancy.   Infection passed on to the newborn.   Cancer.  CAUSES   Monilia. This is a yeast or fungus infection, not an STD.   Bacterial vaginosis. The normal balance of bacteria in the vagina is disrupted and is replaced by an overgrowth of certain bacteria.   Gonorrhea, chlamydia. These are bacterial infections that are STDs.   Vaginal sponges, diaphragms, and intrauterine devices.   Trichomoniasis. This is a STD infection caused by a parasite.   Viruses like herpes and human papillomavirus. Both are STDs.   Pregnancy.   Immunosuppression. This occurs with certain conditions such as HIV infection or cancer.   Using bubble bath.   Taking certain antibiotic medicines.   Sporadic recurrence can occur if you become sick.   Diabetes.   Steroids.   Allergic reaction. If you have an allergy to:   Douches.   Soaps.   Spermicides.   Condoms.   Scented tampons or vaginal sprays.  SYMPTOMS   Abnormal vaginal discharge.   Itching of the vagina.   Pain in the vagina.   Swelling of the vagina.  In some cases, there are no symptoms. TREATMENT  Treatment will vary depending on the type of infection.  Bacteria or trichomonas are usually treated with oral antibiotics and sometimes vaginal cream or suppositories.   Monilia vaginitis is usually treated with vaginal creams, suppositories, or oral antifungal pills.   Viral vaginitis has no cure. However, the symptoms of herpes (a viral vaginitis) can be treated to relieve the discomfort. Human papillomavirus has no symptoms. However, there are treatments for the diseases caused by human  papillomavirus.   With allergic vaginitis, you need to stop using the product that is causing the problem. Vaginal creams can be used to treat the symptoms.   When treating an STD, the sex partner should also be treated.  HOME CARE INSTRUCTIONS   Take all the medicines as directed by your caregiver.   Do not use scented tampons, soaps, or vaginal sprays.   Do not douche.   Tell your sex partner if you have a vaginal infection or an STD.   Do not have sexual intercourse until you have treated the vaginitis.   Practice safe sex by using condoms.  SEEK MEDICAL CARE IF:   You have abdominal pain.   Your symptoms get worse during treatment.  Document Released: 05/31/2007 Document Revised: 07/23/2011 Document Reviewed: 01/24/2009 Exit[employer] Patient Information 2012 [employer]. 

## 2011-12-29 NOTE — MAU Note (Signed)
Last depo injection in April.

## 2011-12-29 NOTE — MAU Provider Note (Signed)
Attestation of Attending Supervision of Advanced Practitioner: Evaluation and management procedures were performed by the [facility] Fellow/PA/CNM/NP under my supervision and collaboration. Chart reviewed, and agree with management and plan.  Jaynie Collins, M.D. 12/29/2011 5:51 PM

## 2011-12-29 NOTE — MAU Note (Signed)
Vaginal irritation - was itching last week, now irritation & discharge.  Denies bleeding.

## 2011-12-29 NOTE — MAU Provider Note (Signed)
History     CSN: [id number]  Arrival date and time: 12/29/11 1322   First Provider Initiated Contact with Patient 12/29/11 1347      Chief Complaint  Patient presents with  . Vaginal Discharge   HPI Joyce Rice is 26 y.o. [phone] presents with vaginal discharge and vaginal irritation.  LMP ?  On DEPO.  Sexually active with one partner X 5 years.  Wants STI screening.  Denies vaginal bleeding.    Past Medical History  Diagnosis Date  . Bartholin cyst   . Irregular periods/menstrual cycles     Past Surgical History  Procedure Date  . Dilation and curettage of uterus   . No past surgeries     History reviewed. No pertinent family history.  History  Substance Use Topics  . Smoking status: Never Smoker   . Smokeless tobacco: Never Used  . Alcohol Use: No    Allergies: No Known Allergies  Prescriptions prior to admission  Medication Sig Dispense Refill  . Norethindrone-Ethinyl Estradiol-Fe (GENERESS FE) 0.8-25 MG-MCG tablet Chew 1 tablet by mouth daily.        Review of Systems  Constitutional: Negative.   HENT:       "tickle" in throat.  Denies pain or exudate  Respiratory: Negative.   Cardiovascular: Negative.   Gastrointestinal: Negative for abdominal pain.  Genitourinary:       + for vaginal discharge and irritation   Physical Exam   Blood pressure 113/65, pulse 66, temperature 98.9 F (37.2 C), temperature source Oral, resp. rate 18, height 5' 0.5" (1.537 m), weight 63.231 kg (139 lb 6.4 oz), unknown if currently breastfeeding.  Physical Exam  Constitutional: She is oriented to person, place, and time. She appears well-developed and well-nourished. No distress.  HENT:  Head: Normocephalic.  Neck: Normal range of motion.  Cardiovascular: Normal rate.   Respiratory: Effort normal.  GI: Soft. She exhibits no mass. There is no tenderness. There is no rebound and no guarding.  Genitourinary: There is injury on the right labia. There is no tenderness  or lesion on the right labia. There is no tenderness or lesion on the left labia. Uterus is not enlarged and not tender. Cervix exhibits friability. Cervix exhibits no discharge. Right adnexum displays no mass, no tenderness and no fullness. Left adnexum displays no mass, no tenderness and no fullness. There is erythema (mild) around the vagina. No bleeding around the vagina. Vaginal discharge (scant white discharge without odor) found.  Neurological: She is alert and oriented to person, place, and time.  Skin: Skin is warm and dry.  Psychiatric: She has a normal mood and affect. Her behavior is normal. Thought content normal.   Results for orders placed during the hospital encounter of 12/29/11 (from the past 24 hour(s))  POCT PREGNANCY, URINE     Status: Normal   Collection Time   12/29/11  1:36 PM      Component Value Range   Preg Test, Ur NEGATIVE  NEGATIVE   WET PREP, GENITAL     Status: Abnormal   Collection Time   12/29/11  2:00 PM      Component Value Range   Yeast Wet Prep HPF POC NONE SEEN  NONE SEEN    Trich, Wet Prep NONE SEEN  NONE SEEN    Clue Cells Wet Prep HPF POC NONE SEEN  NONE SEEN    WBC, Wet Prep HPF POC MANY (*) NONE SEEN     MAU Course  Procedures GC/CHL culture  to lab MDM   Assessment and Plan  A:  Vaginal irritation  P:  Rx for Terazol 3 Vaginal Cream     Hospital will call if culture is positive        Kijana Cromie,EVE M 12/29/2011, 1:48 PM

## 2011-12-30 LAB — GC/CHLAMYDIA PROBE AMP, GENITAL: GC Probe Amp, Genital: NEGATIVE

## 2012-01-05 ENCOUNTER — Telehealth (HOSPITAL_COMMUNITY): Payer: Self-pay | Admitting: *Deleted

## 2012-01-05 ENCOUNTER — Emergency Department (HOSPITAL_COMMUNITY)
Admission: EM | Admit: 2012-01-05 | Discharge: 2012-01-05 | Disposition: A | Discharge status: Home / Self Care | Payer: Medicaid Other | Source: Home / Self Care | Attending: Emergency Medicine | Admitting: Emergency Medicine

## 2012-01-05 ENCOUNTER — Encounter (HOSPITAL_COMMUNITY): Payer: Self-pay | Admitting: Emergency Medicine

## 2012-01-05 DIAGNOSIS — A749 Chlamydial infection, unspecified: Secondary | ICD-10-CM

## 2012-01-05 LAB — POCT URINALYSIS DIP (DEVICE)
Ketones, ur: NEGATIVE mg/dL
Protein, ur: NEGATIVE mg/dL
Specific Gravity, Urine: 1.025 (ref 1.005–1.030)
pH: 5.5 (ref 5.0–8.0)

## 2012-01-05 MED ORDER — OMEPRAZOLE 20 MG PO CPDR: 20.0000 mg | DELAYED_RELEASE_CAPSULE | Freq: Every day | ORAL | Status: DC

## 2012-01-05 MED ORDER — AZITHROMYCIN 250 MG PO TABS
1000.0000 mg | ORAL_TABLET | Freq: Once | ORAL | Status: AC
  Administered 2012-01-05: 1000 mg via ORAL

## 2012-01-05 MED ORDER — AZITHROMYCIN 250 MG PO TABS
ORAL_TABLET | ORAL | Status: AC
  Filled 2012-01-05: qty 4

## 2012-01-05 NOTE — ED Notes (Signed)
PT HERE FOR TREATMENT OF CHLAMYDIA INFECTION AFTER BEING TOLD Friday FROM [facility].PT WAS SEEN THERE 1 WEEK AGO FOR VAG IRRITATION/NO ODOR AND WHITE VAG D/C.PT WAS TOLD FROM [facility] TO F/U WITH HEALTH DEPARTMENT FOR TREAMTENT BUT PT CAME HERE

## 2012-01-05 NOTE — ED Provider Notes (Signed)
History     CSN: [id number]  Arrival date & time 01/05/12  4098   First MD Initiated Contact with Patient 01/05/12 [id number]      Chief Complaint  Patient presents with  . SEXUALLY TRANSMITTED DISEASE    (Consider location/radiation/quality/duration/timing/severity/associated sxs/prior treatment) HPI Comments: Patient returns today to urgent care [facility] for Chlamydia and that she needed treatment. Patient has been expressing some vaginal discharge. She is here to be treated for a positive Chlamydia test. Patient denies any fevers, flank pains or pelvic pain no nausea or vomiting.  The history is provided by the patient.    Past Medical History  Diagnosis Date  . Bartholin cyst   . Irregular periods/menstrual cycles     Past Surgical History  Procedure Date  . Dilation and curettage of uterus   . No past surgeries     No family history on file.  History  Substance Use Topics  . Smoking status: Never Smoker   . Smokeless tobacco: Never Used  . Alcohol Use: No    OB History    Grav Para Term Preterm Abortions TAB SAB Ect Mult Living   4 1 1  0 3 3 0 0 0 1      Review of Systems  Constitutional: Negative for fever, activity change and appetite change.  Genitourinary: Positive for vaginal discharge. Negative for dysuria, vaginal bleeding, genital sores and vaginal pain.    Allergies  Review of patient's allergies indicates no known allergies.  Home Medications   Current Outpatient Rx  Name Route Sig Dispense Refill  . OMEPRAZOLE 20 MG PO CPDR Oral Take 1 capsule (20 mg total) by mouth daily. 5 capsule 0  . TERCONAZOLE 0.4 % VA CREA Vaginal Place 1 applicator vaginally at bedtime. 45 g 0    BP 108/63  Pulse 62  Temp(Src) 98.4 F (36.9 C) (Oral)  Resp 14  SpO2 100%  Physical Exam  Nursing note and vitals reviewed. Constitutional: She appears well-developed and well-nourished. No distress.  Skin: Skin  is warm. No rash noted. No erythema.    ED Course  Procedures (including critical care time)  Labs Reviewed  POCT URINALYSIS DIP (DEVICE) - Abnormal; Notable for the following:    Leukocytes, UA TRACE (*) Biochemical Testing Only. Please order routine urinalysis from main lab if confirmatory testing is needed.   All other components within normal limits  POCT PREGNANCY, URINE   No results found.   1. Chlamydia infection       MDM  Patient was call with a positive Chlamydia result. Boyfriend is also been treated in our clinic today.        Jimmie Molly, MD 01/05/12 1455

## 2012-01-05 NOTE — ED Notes (Signed)
Pt. called and said she was just here 30 min ago and was treated with Zithromax for Chlamydia. She said she was gagging and spit up a little bit-@ 1204. Medication was given @ 1104. I asked if she saw any pill fragments. States she did not look in the sink. She was putting water on her head because she was hot. Discussed with Dr. Ladon Applebaum.  He said pt. kept medicine down for 1 hr, then she is treated. If she vomits more, she get retreated in 1 week.  Pt. given this information, but does not feel sure she was treated. She wants to come back in 1 week and be rechecked. I told her she can go to the [facility] STD clinic for free.  She said she has insurance and wants to come back here. Discussed with Dr. Ladon Applebaum again. He said to tell pt. if she is coming back to be retreated then to take the Prilosec for 2-3 days before she comes back. I called pt. back and gave her this information.  [facility] notified of this. Vassie Moselle 01/05/2012

## 2012-01-06 ENCOUNTER — Telehealth (HOSPITAL_COMMUNITY): Payer: Self-pay | Admitting: *Deleted

## 2012-01-06 NOTE — ED Notes (Signed)
Pt. called and said she was here yesterday and the Rx. of Prilosec was not at [facility] on [address] at [address] I called the pharmacist @ [phone] and verified that she did not get it. I gave her the order as written in the computer.   Vassie Moselle 01/06/2012

## 2012-01-29 ENCOUNTER — Ambulatory Visit (INDEPENDENT_AMBULATORY_CARE_PROVIDER_SITE_OTHER): Payer: Medicaid Other | Admitting: Advanced Practice Midwife

## 2012-01-29 ENCOUNTER — Encounter: Payer: Self-pay | Admitting: Advanced Practice Midwife

## 2012-01-29 VITALS — BP 96/52 | HR 59 | Temp 99.8°F | Ht 62.0 in | Wt 139.9 lb

## 2012-01-29 DIAGNOSIS — N760 Acute vaginitis: Secondary | ICD-10-CM

## 2012-01-29 DIAGNOSIS — Z2089 Contact with and (suspected) exposure to other communicable diseases: Secondary | ICD-10-CM

## 2012-01-29 DIAGNOSIS — N9089 Other specified noninflammatory disorders of vulva and perineum: Secondary | ICD-10-CM

## 2012-01-29 DIAGNOSIS — A749 Chlamydial infection, unspecified: Secondary | ICD-10-CM

## 2012-01-29 DIAGNOSIS — R109 Unspecified abdominal pain: Secondary | ICD-10-CM

## 2012-01-29 DIAGNOSIS — Z202 Contact with and (suspected) exposure to infections with a predominantly sexual mode of transmission: Secondary | ICD-10-CM

## 2012-01-29 DIAGNOSIS — R319 Hematuria, unspecified: Secondary | ICD-10-CM

## 2012-01-29 LAB — WET PREP, GENITAL: Clue Cells Wet Prep HPF POC: NONE SEEN

## 2012-01-29 LAB — POCT URINALYSIS DIP (DEVICE)
Ketones, ur: NEGATIVE mg/dL
Protein, ur: NEGATIVE mg/dL
Specific Gravity, Urine: >=1.03 (ref 1.005–1.030)
pH: 5.5 (ref 5.0–8.0)

## 2012-01-29 MED ORDER — DOXYCYCLINE HYCLATE 50 MG PO CAPS: 50.0000 mg | ORAL_CAPSULE | Freq: Two times a day (BID) | ORAL | Status: DC

## 2012-01-29 MED ORDER — FLUCONAZOLE 150 MG PO TABS: 150.0000 mg | ORAL_TABLET | Freq: Once | ORAL | Status: AC

## 2012-01-29 MED ORDER — KETOROLAC TROMETHAMINE 10 MG PO TABS: 10.0000 mg | ORAL_TABLET | Freq: Four times a day (QID) | ORAL | Status: DC | PRN

## 2012-01-29 MED ORDER — FLUCONAZOLE 150 MG PO TABS: 150.0000 mg | ORAL_TABLET | Freq: Once | ORAL | Status: DC

## 2012-01-29 MED ORDER — KETOROLAC TROMETHAMINE 10 MG PO TABS: 10.0000 mg | ORAL_TABLET | Freq: Four times a day (QID) | ORAL | Status: AC | PRN

## 2012-01-29 MED ORDER — DOXYCYCLINE HYCLATE 50 MG PO CAPS: 100.0000 mg | ORAL_CAPSULE | Freq: Two times a day (BID) | ORAL | Status: AC

## 2012-01-29 NOTE — Progress Notes (Signed)
SI: Patient is a 26 y.o. [id number] female. She is here for evaluation of low abdominal pain and severe vaginitis x2 weeks. She was diagnosed with Chlamydia on 12/29/2011 and treated with azithromycin on 01/05/2012, which she vomited ~ 15 minutes after ingestion. Her partner was also treated at that visit. Has had IC w/ condom since then. No Hx HSV or undiagnosed lesions.  The pain is constant. She rates her pain 8/10. It is located in the low mid abdomen. She describes the pain as sharp and worse with urination, but denies distinct dysuria. She also denies frequency, flank pain, urgency, fever, chills, GI complaints or dyspareunia.  ROS: Pertinent findings noted in HPI.  O: Uncomfortable-appearing. A&Ox4 Abd: NT, +BS x 4. No CVAT Pelvic: NEFG, moderate excoriation. 1 mm circular area of open skin. Moderate amount of thick, curd-like, odorless discharge. Cervix slightly friable, no CMT. No adnexal tenderness or masses.  A/P 1. Exposure to STD  GC/chlamydia probe amp, genital, ketorolac (TORADOL) 10 MG tablet, doxycycline (VIBRAMYCIN) 50 MG capsule, DISCONTINUED: doxycycline (VIBRAMYCIN) 50 MG capsule, DISCONTINUED: ketorolac (TORADOL) 10 MG tablet  2. Vaginitis  GC/chlamydia probe amp, genital, Wet prep, genital, fluconazole (DIFLUCAN) 150 MG tablet, DISCONTINUED: fluconazole (DIFLUCAN) 150 MG tablet  3. Vulvar lesion  Herpes simplex virus culture  4. Abdominal pain  Urine Culture  5. Hematuria  Urine Culture  No IC x 1 week. Consider partner re-Tx. Safe-sex practices reviewed.  PID precautions   Dorathy Kinsman, CNM 01/29/2012 10:30 AM

## 2012-01-29 NOTE — Patient Instructions (Signed)
Chlamydia Test This a test to see if you have Chlamydia which is a common sexually transmitted disease (STD). You get this disease by having sexual contact (oral, vaginal, or anal) with an infected person. An infected mother can spread the disease to her baby during childbirth. If this test is positive, you have a sexually transmitted disease (STD). DO NOT have unprotected sex until the treatment is complete and you have been retested and are negative. PREPARATION FOR TEST Your caregiver will use a swab to take a sample. The swab may be from the cervix, urethra, penis, anus, or throat. It may be possible to use a urine sample, if the lab where the sample is sent is able to test urine for this disease. NORMAL FINDINGS  No Growth   Antibodies: less than 1:640  Ranges for normal findings may vary among different laboratories and hospitals. You should always check with your doctor after having lab work or other tests done to discuss the meaning of your test results and whether your values are considered within normal limits. MEANING OF TEST  Your caregiver will go over the test results with you and discuss the importance and meaning of your results, as well as treatment options and the need for additional tests if necessary. OBTAINING THE TEST RESULTS It is your responsibility to obtain your test results. Ask the lab or department performing the test when and how you will get your results. Document Released: 08/26/2004 Document Revised: 07/23/2011 Document Reviewed: 07/12/2008 [facility] Patient Information 2012 [place], [place].  Candidal Vulvovaginitis Candidal vulvovaginitis is an infection of the vagina and vulva. The vulva is the skin around the opening of the vagina. This may cause itching and discomfort in and around the vagina.  HOME CARE  Only take medicine as told by your doctor.   Do not have sex (intercourse) until the infection is healed or as told by your doctor.   Practice safe sex.     Tell your sex partner about your infection.   Do not douche or use tampons.   Wear cotton underwear. Do not wear tight pants or panty hose.   Eat yogurt. This may help treat and prevent yeast infections.  GET HELP RIGHT AWAY IF:   You have a fever.   Your problems get worse during treatment or do not get better in 3 days.   You have discomfort, irritation, or itching in your vagina or vulva area.   You have pain after sex.   You start to get belly (abdominal) pain.  MAKE SURE YOU:  Understand these instructions.   Will watch your condition.   Will get help right away if you are not doing well or get worse.  Document Released: 10/30/2008 Document Revised: 07/23/2011 Document Reviewed: 10/30/2008 [facility] Patient Information 2012 [place], [place].

## 2012-02-01 LAB — GC/CHLAMYDIA PROBE AMP, GENITAL: GC Probe Amp, Genital: NEGATIVE

## 2012-02-04 ENCOUNTER — Telehealth: Payer: Self-pay | Admitting: General Practice

## 2012-02-04 NOTE — Telephone Encounter (Signed)
Patient called again at 2:17 requesting results.

## 2012-02-04 NOTE — Telephone Encounter (Signed)
Patient called "regarding test results" and would like a call back soon.

## 2012-02-05 ENCOUNTER — Other Ambulatory Visit: Payer: Self-pay | Admitting: Obstetrics and Gynecology

## 2012-02-05 DIAGNOSIS — B379 Candidiasis, unspecified: Secondary | ICD-10-CM

## 2012-02-05 DIAGNOSIS — A749 Chlamydial infection, unspecified: Secondary | ICD-10-CM

## 2012-02-05 MED ORDER — FLUCONAZOLE 150 MG PO TABS: 150.0000 mg | ORAL_TABLET | Freq: Once | ORAL | Status: AC

## 2012-02-05 MED ORDER — AZITHROMYCIN 1 G PO PACK: 1.0000 | PACK | Freq: Once | ORAL | Status: DC

## 2012-02-05 NOTE — Telephone Encounter (Signed)
Called patient and advised of STD lab results. States she is symptomatic for yeast (white-yellowish curdy discharge). She is positive for chlamydia; all else normal/negative. Rx for yeast (diflucan) and Chlamydia (z-pack 1 gm) sent to pharmacy on chart. Patient also added that she lost the written Rx for doxycycline given to her during visit. Patient notified of new Rx sent and patient satisfied.

## 2012-02-08 ENCOUNTER — Telehealth: Payer: Self-pay | Admitting: *Deleted

## 2012-02-08 NOTE — Telephone Encounter (Signed)
Patient called and left a message stating she is calling about a prescription she took over the weekend.and requests a call back.

## 2012-02-09 MED ORDER — AZITHROMYCIN 500 MG PO TABS: 1000.0000 mg | ORAL_TABLET | Freq: Once | ORAL | Status: AC

## 2012-02-09 NOTE — Telephone Encounter (Signed)
Called pt and pt informed me that she had threw up the powder zithromax could she get another form and can she take something to settle her stomach.  I advised pt to drink some ginger ale and eat crackers to help settle her stomach and that I would prescribe her tablet form to give her pharmacy a couple of hours to pick up.  Pt stated understanding and had no further questions.

## 2012-02-15 ENCOUNTER — Ambulatory Visit (INDEPENDENT_AMBULATORY_CARE_PROVIDER_SITE_OTHER): Payer: Medicaid Other | Admitting: Medical

## 2012-02-15 VITALS — BP 105/63 | HR 73 | Resp 12

## 2012-02-15 DIAGNOSIS — Z3049 Encounter for surveillance of other contraceptives: Secondary | ICD-10-CM

## 2012-02-15 DIAGNOSIS — Z3042 Encounter for surveillance of injectable contraceptive: Secondary | ICD-10-CM

## 2012-02-24 ENCOUNTER — Encounter (HOSPITAL_COMMUNITY): Payer: Self-pay | Admitting: *Deleted

## 2012-02-24 ENCOUNTER — Inpatient Hospital Stay (HOSPITAL_COMMUNITY)
Admission: AD | Admit: 2012-02-24 | Discharge: 2012-02-24 | Disposition: A | Discharge status: Home / Self Care | Payer: Medicaid Other | Source: Ambulatory Visit | Attending: Family Medicine | Admitting: Family Medicine

## 2012-02-24 DIAGNOSIS — N739 Female pelvic inflammatory disease, unspecified: Secondary | ICD-10-CM | POA: Insufficient documentation

## 2012-02-24 DIAGNOSIS — N730 Acute parametritis and pelvic cellulitis: Secondary | ICD-10-CM | POA: Diagnosis present

## 2012-02-24 DIAGNOSIS — R109 Unspecified abdominal pain: Secondary | ICD-10-CM | POA: Insufficient documentation

## 2012-02-24 HISTORY — DX: Chlamydial infection, unspecified: A74.9

## 2012-02-24 LAB — CBC
MCH: 30.3 pg (ref 26.0–34.0)
Platelets: 221 10*3/uL (ref 150–400)
RBC: 4.26 MIL/uL (ref 3.87–5.11)
RDW: 12 % (ref 11.5–15.5)
WBC: 7 10*3/uL (ref 4.0–10.5)

## 2012-02-24 LAB — URINALYSIS, ROUTINE W REFLEX MICROSCOPIC
Bilirubin Urine: NEGATIVE
Glucose, UA: NEGATIVE mg/dL
Ketones, ur: NEGATIVE mg/dL
pH: 6 (ref 5.0–8.0)

## 2012-02-24 LAB — WET PREP, GENITAL
Clue Cells Wet Prep HPF POC: NONE SEEN
Trich, Wet Prep: NONE SEEN
Yeast Wet Prep HPF POC: NONE SEEN

## 2012-02-24 LAB — URINE MICROSCOPIC-ADD ON

## 2012-02-24 MED ORDER — CEFTRIAXONE SODIUM 250 MG IJ SOLR
250.0000 mg | INTRAMUSCULAR | Status: AC
  Administered 2012-02-24: 250 mg via INTRAMUSCULAR
  Filled 2012-02-24: qty 250

## 2012-02-24 MED ORDER — AZITHROMYCIN 250 MG PO TABS
1000.0000 mg | ORAL_TABLET | ORAL | Status: DC
  Filled 2012-02-24: qty 4

## 2012-02-24 MED ORDER — METRONIDAZOLE 500 MG PO TABS: 500.0000 mg | ORAL_TABLET | Freq: Two times a day (BID) | ORAL | Status: AC

## 2012-02-24 MED ORDER — ONDANSETRON 8 MG PO TBDP
8.0000 mg | ORAL_TABLET | ORAL | Status: AC
Start: 2012-02-24 — End: 2012-02-24
  Administered 2012-02-24: 8 mg via ORAL
  Filled 2012-02-24: qty 1

## 2012-02-24 MED ORDER — AZITHROMYCIN 500 MG PO TABS: 1000.0000 mg | ORAL_TABLET | Freq: Once | ORAL | Status: AC

## 2012-02-24 MED ORDER — PROMETHAZINE HCL 12.5 MG PO TABS: 12.5000 mg | ORAL_TABLET | Freq: Four times a day (QID) | ORAL | Status: DC | PRN

## 2012-02-24 NOTE — MAU Note (Signed)
Patient states she started having right lower to mid abdominal pain for a few days after intercourse. White discharge.

## 2012-02-24 NOTE — MAU Provider Note (Signed)
History     CSN: [id number]  Arrival date and time: 02/24/12 4098   First Provider Initiated Contact with Patient 02/24/12 1029      Chief Complaint  Patient presents with  . Abdominal Pain   HPI  26yo female, [id number] who presents with 2 days of right to mid lower abdominal pain which developed after intercourse. Pain is intermittent ranging from sharp 10/10 to 0/10 and lasting minutes in duration. Pt originally was treated 2 weeks ago for chlamydia infection and partner was also treated. She has had no new partners. She reports some white, slightly odorous vaginal discharge which developed around the same time as the abdominal pain. She denies fever or chills, no nausea or vomiting, no diarrhea. No urinary symptoms. Pt uses depo injections for birth control; last injection July 1. No history of appendectomy.  Chart records and further interview with pt show that chlamydia was originally diagnosed 12/29/11 and treated but pt vomited antibiotic within 30 minutes of taking it. She continued to have symptoms and was seen again and retested (chlamydia positive)/retreated on 01/29/12. Pt reports partner was treated following both encounters but she is unsure if he has remained faithful in between treatment and she has had unprotected intercourse.  OB History    Grav Para Term Preterm Abortions TAB SAB Ect Mult Living   4 1 1  0 3 3 0 0 0 1      Past Medical History  Diagnosis Date  . Bartholin cyst   . Irregular periods/menstrual cycles   . Chlamydia     Past Surgical History  Procedure Date  . Dilation and curettage of uterus   . No past surgeries     History reviewed. No pertinent family history.  History  Substance Use Topics  . Smoking status: Never Smoker   . Smokeless tobacco: Never Used  . Alcohol Use: No    Allergies: No Known Allergies  No prescriptions prior to admission    Review of Systems  Constitutional: Negative for fever and chills.  Respiratory:  Negative for cough and shortness of breath.   Cardiovascular: Negative for chest pain and leg swelling.  Gastrointestinal: Positive for abdominal pain. Negative for nausea, vomiting, diarrhea, constipation, blood in stool and melena.  Genitourinary: Negative for dysuria, urgency, frequency, hematuria and flank pain.  Neurological: Negative for dizziness and headaches.   Physical Exam   Blood pressure 108/57, pulse 68, temperature 99.4 F (37.4 C), temperature source Oral, resp. rate 16, height 5' 0.5" (1.537 m), weight 62.869 kg (138 lb 9.6 oz), SpO2 100.00%, unknown if currently breastfeeding.  Physical Exam  Constitutional: She is oriented to person, place, and time. She appears well-developed. No distress.  HENT:  Head: Normocephalic and atraumatic.  Eyes: Pupils are equal, round, and reactive to light.  Neck: Normal range of motion.  Cardiovascular: Normal rate, regular rhythm, normal heart sounds and intact distal pulses.  Exam reveals no gallop and no friction rub.   No murmur heard. Respiratory: Effort normal and breath sounds normal. No respiratory distress. She has no wheezes. She has no rales.  GI: Soft. Bowel sounds are normal. She exhibits no distension and no mass. There is no tenderness. There is no rebound and no guarding.  Genitourinary: There is no rash, tenderness or lesion on the right labia. There is no rash, tenderness or lesion on the left labia. Cervix exhibits discharge. Cervix exhibits no motion tenderness. Right adnexum displays no mass and no tenderness. Left adnexum displays no mass and  no tenderness. No tenderness or bleeding around the vagina. Vaginal discharge found.       No CVA tenderness.  Musculoskeletal: Normal range of motion.  Neurological: She is alert and oriented to person, place, and time.  Skin: Skin is warm and dry. No rash noted. No erythema.  Psychiatric: She has a normal mood and affect. Her behavior is normal. Judgment and thought content  normal.    MAU Course  Procedures SVE  MDM  Initial diagnosis: ectopic pregnancy vs appendicitis vs PID vs STI vs UTI  Urine pregnancy negative. Ectopic pregnancy ruled out.  Urinalysis    Component Value Date/Time   COLORURINE YELLOW 02/24/2012 0955   APPEARANCEUR CLEAR 02/24/2012 0955   LABSPEC 1.020 02/24/2012 0955   PHURINE 6.0 02/24/2012 0955   GLUCOSEU NEGATIVE 02/24/2012 0955   HGBUR NEGATIVE 02/24/2012 0955   BILIRUBINUR NEGATIVE 02/24/2012 0955   KETONESUR NEGATIVE 02/24/2012 0955   PROTEINUR NEGATIVE 02/24/2012 0955   UROBILINOGEN 0.2 02/24/2012 0955   NITRITE NEGATIVE 02/24/2012 0955   LEUKOCYTESUR SMALL* 02/24/2012 0955  No infection present, but will send urine for culture.   CBC    Component Value Date/Time   WBC 7.0 02/24/2012 1103   RBC 4.26 02/24/2012 1103   HGB 12.9 02/24/2012 1103   HCT 37.7 02/24/2012 1103   PLT 221 02/24/2012 1103   MCV 88.5 02/24/2012 1103   MCH 30.3 02/24/2012 1103   MCHC 34.2 02/24/2012 1103   RDW 12.0 02/24/2012 1103   LYMPHSABS 1.7 09/21/2010 1447   MONOABS 0.8 09/21/2010 1447   EOSABS 0.0 09/21/2010 1447   BASOSABS 0.0 09/21/2010 1447  Normal white count in setting of normal abdominal exam (no RLQ tenderness to palpation, no guarding, no masses) and no systemic symptoms (fever, nausea, vomiting) makes appendicitis very unlikely diagnosis.  Microscopic wet-mount exam shows white blood cells (many), no yeast, no clue cells, no trich.  PID most likely diagnosis given history of prolonged chlamydia infection, discharge on VSE, and pain after intercourse.  Assessment and Plan  PID  Will treat with Metronidazole 500 mg bid X 7days, Ceftriaxone 250 mg IM single dose, and Azithromycin 1 g now with Zofran. Will also give PRN prescription for phenergan given pt history of GI upset with antibiotics.    Latina Craver 02/24/2012, 10:33 AM   I have seen this patient and agree with the above PA student's note.  Metronidazole added to PID therapy r/t foul odor  with discharge despite lack of clue cells and/or trichomonas on wet prep.  LEFTWICH-KIRBY, Asiya Cutbirth Certified Nurse-Midwife

## 2012-02-25 LAB — URINE CULTURE: Colony Count: 10000

## 2012-03-01 NOTE — MAU Provider Note (Signed)
Chart reviewed and agree with management and plan.  

## 2012-03-11 ENCOUNTER — Ambulatory Visit: Payer: Medicaid Other | Admitting: Advanced Practice Midwife

## 2012-04-01 ENCOUNTER — Ambulatory Visit: Payer: Medicaid Other | Admitting: Advanced Practice Midwife

## 2012-05-03 ENCOUNTER — Ambulatory Visit (INDEPENDENT_AMBULATORY_CARE_PROVIDER_SITE_OTHER): Payer: Medicaid Other

## 2012-05-03 VITALS — BP 114/66 | HR 64 | Wt 137.0 lb

## 2012-05-03 DIAGNOSIS — Z3049 Encounter for surveillance of other contraceptives: Secondary | ICD-10-CM

## 2012-05-03 MED ORDER — MEDROXYPROGESTERONE ACETATE 150 MG/ML IM SUSP
150.0000 mg | Freq: Once | INTRAMUSCULAR | Status: AC
  Administered 2012-05-03: 150 mg via INTRAMUSCULAR

## 2012-07-19 ENCOUNTER — Ambulatory Visit (INDEPENDENT_AMBULATORY_CARE_PROVIDER_SITE_OTHER): Payer: Medicaid Other | Admitting: *Deleted

## 2012-07-19 VITALS — BP 102/63 | HR 61 | Temp 98.1°F | Ht 63.0 in | Wt 138.2 lb

## 2012-07-19 DIAGNOSIS — Z3049 Encounter for surveillance of other contraceptives: Secondary | ICD-10-CM

## 2012-07-19 MED ORDER — MEDROXYPROGESTERONE ACETATE 150 MG/ML IM SUSP
150.0000 mg | Freq: Once | INTRAMUSCULAR | Status: AC
  Administered 2012-07-19: 150 mg via INTRAMUSCULAR

## 2012-08-21 ENCOUNTER — Encounter (HOSPITAL_COMMUNITY): Payer: Self-pay | Admitting: *Deleted

## 2012-08-21 ENCOUNTER — Inpatient Hospital Stay (HOSPITAL_COMMUNITY)
Admission: AD | Admit: 2012-08-21 | Discharge: 2012-08-22 | Disposition: A | Discharge status: Home / Self Care | Payer: Medicaid Other | Source: Ambulatory Visit | Attending: Family Medicine | Admitting: Family Medicine

## 2012-08-21 DIAGNOSIS — M549 Dorsalgia, unspecified: Secondary | ICD-10-CM | POA: Insufficient documentation

## 2012-08-21 DIAGNOSIS — K529 Noninfective gastroenteritis and colitis, unspecified: Secondary | ICD-10-CM

## 2012-08-21 DIAGNOSIS — R1032 Left lower quadrant pain: Secondary | ICD-10-CM | POA: Insufficient documentation

## 2012-08-21 DIAGNOSIS — R197 Diarrhea, unspecified: Secondary | ICD-10-CM | POA: Insufficient documentation

## 2012-08-21 LAB — POCT PREGNANCY, URINE: Preg Test, Ur: NEGATIVE

## 2012-08-21 NOTE — MAU Note (Signed)
Pt on depot inj for birth control, last inj 07/19/2012.  Pt having lower abd pain and back pain, indigestion.  The pain comes and goes, sometimes the pain comes after eating or drinking.  Denies bleeding or discharge.

## 2012-08-22 DIAGNOSIS — K5289 Other specified noninfective gastroenteritis and colitis: Secondary | ICD-10-CM

## 2012-08-22 LAB — URINE MICROSCOPIC-ADD ON

## 2012-08-22 LAB — CBC
HCT: 38.3 % (ref 36.0–46.0)
Hemoglobin: 13.3 g/dL (ref 12.0–15.0)
MCH: 29.9 pg (ref 26.0–34.0)
MCHC: 34.7 g/dL (ref 30.0–36.0)
RDW: 12 % (ref 11.5–15.5)

## 2012-08-22 LAB — COMPREHENSIVE METABOLIC PANEL
Albumin: 3.9 g/dL (ref 3.5–5.2)
Alkaline Phosphatase: 74 U/L (ref 39–117)
BUN: 14 mg/dL (ref 6–23)
Calcium: 9.6 mg/dL (ref 8.4–10.5)
GFR calc Af Amer: >90 mL/min (ref >90)
Glucose, Bld: 80 mg/dL (ref 70–99)
Potassium: 3.7 mEq/L (ref 3.5–5.1)
Sodium: 141 mEq/L (ref 135–145)
Total Protein: 7.5 g/dL (ref 6.0–8.3)

## 2012-08-22 LAB — URINALYSIS, ROUTINE W REFLEX MICROSCOPIC: Bilirubin Urine: NEGATIVE

## 2012-08-22 MED ORDER — ONDANSETRON HCL 8 MG PO TABS: 8.0000 mg | ORAL_TABLET | Freq: Three times a day (TID) | ORAL | Status: DC | PRN

## 2012-08-22 MED ORDER — GI COCKTAIL ~~LOC~~
30.0000 mL | Freq: Once | ORAL | Status: AC
  Administered 2012-08-22: 30 mL via ORAL
  Filled 2012-08-22: qty 30

## 2012-08-22 NOTE — MAU Provider Note (Signed)
Chart reviewed and agree with management and plan.  

## 2012-08-22 NOTE — MAU Provider Note (Signed)
History     CSN: [id number]  Arrival date and time: 08/21/12 2322   First Provider Initiated Contact with Patient 08/22/12 0103      Chief Complaint  Patient presents with  . Abdominal Pain  . Back Pain  . Gastrophageal Reflux   HPI This is a 27 y.o. who presents with c/o abdominal pain which comes and goes. Has been this way since Christmas.  Worse when she eats bacon or pizza. Has some diarrhea and loose stools. Pain is all over abdomen, mostly upper and LLQ. No fever. Occasional nausea. No vomiting lately . No abnormal uterine bleeding. No GYN complaints.  RN Note:  Pt on depot inj for birth control, last inj 07/19/2012. Pt having lower abd pain and back pain, indigestion. The pain comes and goes, sometimes the pain comes after eating or drinking. Denies bleeding or discharge.         OB History    Grav Para Term Preterm Abortions TAB SAB Ect Mult Living   4 1 1  0 3 3 0 0 0 1      Past Medical History  Diagnosis Date  . Bartholin cyst   . Irregular periods/menstrual cycles   . Chlamydia     Past Surgical History  Procedure Date  . Dilation and curettage of uterus     History reviewed. No pertinent family history.  History  Substance Use Topics  . Smoking status: Never Smoker   . Smokeless tobacco: Never Used  . Alcohol Use: No    Allergies: No Known Allergies  Prescriptions prior to admission  Medication Sig Dispense Refill  . promethazine (PHENERGAN) 12.5 MG tablet Take 1 tablet (12.5 mg total) by mouth every 6 (six) hours as needed for nausea.  30 tablet  0    Review of Systems  Constitutional: Negative for fever, chills and malaise/fatigue.  Respiratory: Negative for cough.   Gastrointestinal: Positive for heartburn, nausea, abdominal pain and diarrhea. Negative for vomiting, constipation and blood in stool.  Genitourinary: Negative for dysuria.       No vaginal bleeding   Musculoskeletal: Negative for myalgias.  Neurological: Negative for  weakness.   Physical Exam   Blood pressure 112/70, pulse 73, temperature 97.8 F (36.6 C), temperature source Oral, height 5\' 2"  (1.575 m), weight 140 lb 12.8 oz (63.866 kg), unknown if currently breastfeeding.  Physical Exam  Constitutional: She is oriented to person, place, and time. She appears well-developed and well-nourished. No distress.  HENT:  Head: Normocephalic.  Cardiovascular: Normal rate.   Respiratory: Effort normal.  GI: Soft. She exhibits no distension and no mass. There is tenderness (slight over LLQ). There is no rebound and no guarding.  Musculoskeletal: Normal range of motion.  Neurological: She is alert and oriented to person, place, and time.  Skin: Skin is warm and dry.  Psychiatric: She has a normal mood and affect.    MAU Course  Procedures  MDM CBC and CMET done to help rule out appendicitis and cholecystitis.  Results for orders placed during the hospital encounter of 08/21/12 (from the past 24 hour(s))  URINALYSIS, ROUTINE W REFLEX MICROSCOPIC     Status: Abnormal   Collection Time   08/21/12 11:35 PM      Component Value Range   Color, Urine YELLOW  YELLOW   APPearance CLEAR  CLEAR   Specific Gravity, Urine >1.030 (*) 1.005 - 1.030   pH 5.5  5.0 - 8.0   Glucose, UA NEGATIVE  NEGATIVE mg/dL  Hgb urine dipstick NEGATIVE  NEGATIVE   Bilirubin Urine NEGATIVE  NEGATIVE   Ketones, ur NEGATIVE  NEGATIVE mg/dL   Protein, ur NEGATIVE  NEGATIVE mg/dL   Urobilinogen, UA 0.2  0.0 - 1.0 mg/dL   Nitrite NEGATIVE  NEGATIVE   Leukocytes, UA TRACE (*) NEGATIVE  URINE MICROSCOPIC-ADD ON     Status: Normal   Collection Time   08/21/12 11:35 PM      Component Value Range   Squamous Epithelial / LPF RARE  RARE   WBC, UA 3-6  <3 WBC/hpf   RBC / HPF 0-2  <3 RBC/hpf   Bacteria, UA RARE  RARE   Urine-Other MUCOUS PRESENT    POCT PREGNANCY, URINE     Status: Normal   Collection Time   08/21/12 11:44 PM      Component Value Range   Preg Test, Ur NEGATIVE  NEGATIVE   CBC     Status: Normal   Collection Time   08/22/12  1:05 AM      Component Value Range   WBC 8.5  4.0 - 10.5 K/uL   RBC 4.45  3.87 - 5.11 MIL/uL   Hemoglobin 13.3  12.0 - 15.0 g/dL   HCT 65.7  84.6 - 96.2 %   MCV 86.1  78.0 - 100.0 fL   MCH 29.9  26.0 - 34.0 pg   MCHC 34.7  30.0 - 36.0 g/dL   RDW 95.2  84.1 - 32.4 %   Platelets 244  150 - 400 K/uL  COMPREHENSIVE METABOLIC PANEL     Status: Abnormal   Collection Time   08/22/12  1:05 AM      Component Value Range   Sodium 141  135 - 145 mEq/L   Potassium 3.7  3.5 - 5.1 mEq/L   Chloride 103  96 - 112 mEq/L   CO2 28  19 - 32 mEq/L   Glucose, Bld 80  70 - 99 mg/dL   BUN 14  6 - 23 mg/dL   Creatinine, Ser 4.01  0.50 - 1.10 mg/dL   Calcium 9.6  8.4 - 02.7 mg/dL   Total Protein 7.5  6.0 - 8.3 g/dL   Albumin 3.9  3.5 - 5.2 g/dL   AST 15  0 - 37 U/L   ALT 17  0 - 35 U/L   Alkaline Phosphatase 74  39 - 117 U/L   Total Bilirubin 0.2 (*) 0.3 - 1.2 mg/dL   GFR calc non Af Amer >90  >90 mL/min   GFR calc Af Amer >90  >90 mL/min   GI cocktail given  Assessment and Plan  A:  Diffuse, colicky abdominal pain      Intermittent loose stools      Probable gastroenteritis vs food/fatty food intolerance  P:  Discharge home      Rx Zofran for nausea      Referred to [facility] for GI eval and possible referral        [facility] 08/22/2012, 2:19 AM

## 2012-08-29 ENCOUNTER — Ambulatory Visit: Payer: Medicaid Other | Admitting: Advanced Practice Midwife

## 2012-08-31 ENCOUNTER — Encounter (HOSPITAL_COMMUNITY): Payer: Self-pay | Admitting: Emergency Medicine

## 2012-08-31 ENCOUNTER — Emergency Department (HOSPITAL_COMMUNITY)
Admission: EM | Admit: 2012-08-31 | Discharge: 2012-08-31 | Disposition: A | Discharge status: Home / Self Care | Payer: Medicaid Other | Attending: Emergency Medicine | Admitting: Emergency Medicine

## 2012-08-31 DIAGNOSIS — Z8742 Personal history of other diseases of the female genital tract: Secondary | ICD-10-CM | POA: Insufficient documentation

## 2012-08-31 DIAGNOSIS — B349 Viral infection, unspecified: Secondary | ICD-10-CM

## 2012-08-31 DIAGNOSIS — Z8619 Personal history of other infectious and parasitic diseases: Secondary | ICD-10-CM | POA: Insufficient documentation

## 2012-08-31 DIAGNOSIS — J029 Acute pharyngitis, unspecified: Secondary | ICD-10-CM | POA: Insufficient documentation

## 2012-08-31 MED ORDER — PREDNISONE 20 MG PO TABS
60.0000 mg | ORAL_TABLET | Freq: Once | ORAL | Status: AC
  Administered 2012-08-31: 60 mg via ORAL
  Filled 2012-08-31: qty 3

## 2012-08-31 MED ORDER — IBUPROFEN 200 MG PO TABS
600.0000 mg | ORAL_TABLET | Freq: Once | ORAL | Status: AC
  Administered 2012-08-31: 600 mg via ORAL
  Filled 2012-08-31: qty 3

## 2012-08-31 NOTE — ED Notes (Signed)
Pt states that she has had a productive cough, sore throat since Saturday.  Denies NVD.

## 2012-08-31 NOTE — ED Provider Notes (Signed)
History    27 year old female sore throat. Gradual onset Saturday. Progressively worsening. Pain is worse with swallowing. Productive cough. No shortness of breath. No chest or back pain. No wheezing. No nausea or vomiting. No fever. No urinary complaints. Has not tried taking anything for her symptoms. No significant past medical history. Nonsmoker.   CSN: [id number]  Arrival date & time 08/31/12  1034   First MD Initiated Contact with Patient 08/31/12 1048      Chief Complaint  Patient presents with  . Cough  . Sore Throat    (Consider location/radiation/quality/duration/timing/severity/associated sxs/prior treatment) HPI  Past Medical History  Diagnosis Date  . Bartholin cyst   . Irregular periods/menstrual cycles   . Chlamydia     Past Surgical History  Procedure Date  . Dilation and curettage of uterus     History reviewed. No pertinent family history.  History  Substance Use Topics  . Smoking status: Never Smoker   . Smokeless tobacco: Never Used  . Alcohol Use: No    OB History    Grav Para Term Preterm Abortions TAB SAB Ect Mult Living   4 1 1  0 3 3 0 0 0 1      Review of Systems  All systems reviewed and negative, other than as noted in HPI.   Allergies  Review of patient's allergies indicates no known allergies.  Home Medications   Current Outpatient Rx  Name  Route  Sig  Dispense  Refill  . PROMETHAZINE HCL 12.5 MG PO TABS   Oral   Take 1 tablet (12.5 mg total) by mouth every 6 (six) hours as needed for nausea.   30 tablet   0     BP 102/63  Pulse 84  Temp 98.4 F (36.9 C)  Resp 20  SpO2 99%  Physical Exam  Nursing note and vitals reviewed. Constitutional: She appears well-developed and well-nourished. No distress.  HENT:  Head: Normocephalic and atraumatic.  Left Ear: External ear normal.       Posterior pharynx is injected. There is no exudate. Uvula is midline. Patient is handling her secretions. Normal phonation. No tongue  elevation. Submental tissues are soft. There is no cervical adenopathy. Neck supple. No stridor.  Eyes: Conjunctivae normal are normal. Right eye exhibits no discharge. Left eye exhibits no discharge.  Neck: Neck supple.  Cardiovascular: Normal rate, regular rhythm and normal heart sounds.  Exam reveals no gallop and no friction rub.   No murmur heard. Pulmonary/Chest: Effort normal and breath sounds normal. No respiratory distress. She has no wheezes.  Abdominal: Soft. She exhibits no distension. There is no tenderness.  Musculoskeletal: She exhibits no edema and no tenderness.  Neurological: She is alert.  Skin: Skin is warm and dry.  Psychiatric: She has a normal mood and affect. Her behavior is normal. Thought content normal.    ED Course  Procedures (including critical care time)  Labs Reviewed - No data to display No results found.   1. Viral pharyngitis   2. Viral illness       MDM  27 year old female with sore throat. Likely viral pharyngitis. Patient has zero out of four centor criteria. There no evidence of deep space head or neck infection. Plan symptomatic treatment.        Raeford Razor, MD 09/06/12 1444

## 2012-09-02 ENCOUNTER — Ambulatory Visit (INDEPENDENT_AMBULATORY_CARE_PROVIDER_SITE_OTHER): Payer: Medicaid Other | Admitting: Medical

## 2012-09-02 ENCOUNTER — Encounter: Payer: Self-pay | Admitting: Medical

## 2012-09-02 ENCOUNTER — Other Ambulatory Visit (HOSPITAL_COMMUNITY)
Admission: RE | Admit: 2012-09-02 | Discharge: 2012-09-02 | Disposition: A | Discharge status: Home / Self Care | Payer: Medicaid Other | Source: Ambulatory Visit | Attending: Medical | Admitting: Medical

## 2012-09-02 VITALS — BP 110/63 | HR 75 | Temp 99.1°F | Ht 62.0 in | Wt 136.0 lb

## 2012-09-02 DIAGNOSIS — R109 Unspecified abdominal pain: Secondary | ICD-10-CM

## 2012-09-02 DIAGNOSIS — N898 Other specified noninflammatory disorders of vagina: Secondary | ICD-10-CM

## 2012-09-02 DIAGNOSIS — N760 Acute vaginitis: Secondary | ICD-10-CM | POA: Insufficient documentation

## 2012-09-02 DIAGNOSIS — Z113 Encounter for screening for infections with a predominantly sexual mode of transmission: Secondary | ICD-10-CM | POA: Insufficient documentation

## 2012-09-02 NOTE — Patient Instructions (Addendum)
Viral Gastroenteritis Viral gastroenteritis is also known as stomach flu. This condition affects the stomach and intestinal tract. It can cause sudden diarrhea and vomiting. The illness typically lasts 3 to 8 days. Most people develop an immune response that eventually gets rid of the virus. While this natural response develops, the virus can make you quite ill. CAUSES  Many different viruses can cause gastroenteritis, such as rotavirus or noroviruses. You can catch one of these viruses by consuming contaminated food or water. You may also catch a virus by sharing utensils or other personal items with an infected person or by touching a contaminated surface. SYMPTOMS  The most common symptoms are diarrhea and vomiting. These problems can cause a severe loss of body fluids (dehydration) and a body salt (electrolyte) imbalance. Other symptoms may include:  Fever.  Headache.  Fatigue.  Abdominal pain. DIAGNOSIS  Your caregiver can usually diagnose viral gastroenteritis based on your symptoms and a physical exam. A stool sample may also be taken to test for the presence of viruses or other infections. TREATMENT  This illness typically goes away on its own. Treatments are aimed at rehydration. The most serious cases of viral gastroenteritis involve vomiting so severely that you are not able to keep fluids down. In these cases, fluids must be given through an intravenous line (IV). HOME CARE INSTRUCTIONS   Drink enough fluids to keep your urine clear or pale yellow. Drink small amounts of fluids frequently and increase the amounts as tolerated.  Ask your caregiver for specific rehydration instructions.  Avoid:  Foods high in sugar.  Alcohol.  Carbonated drinks.  Tobacco.  Juice.  Caffeine drinks.  Extremely hot or cold fluids.  Fatty, greasy foods.  Too much intake of anything at one time.  Dairy products until 24 to 48 hours after diarrhea stops.  You may consume probiotics.  Probiotics are active cultures of beneficial bacteria. They may lessen the amount and number of diarrheal stools in adults. Probiotics can be found in yogurt with active cultures and in supplements.  Wash your hands well to avoid spreading the virus.  Only take over-the-counter or prescription medicines for pain, discomfort, or fever as directed by your caregiver. Do not give aspirin to children. Antidiarrheal medicines are not recommended.  Ask your caregiver if you should continue to take your regular prescribed and over-the-counter medicines.  Keep all follow-up appointments as directed by your caregiver. SEEK IMMEDIATE MEDICAL CARE IF:   You are unable to keep fluids down.  You do not urinate at least once every 6 to 8 hours.  You develop shortness of breath.  You notice blood in your stool or vomit. This may look like coffee grounds.  You have abdominal pain that increases or is concentrated in one small area (localized).  You have persistent vomiting or diarrhea.  You have a fever.  The patient is a child younger than 3 months, and he or she has a fever.  The patient is a child older than 3 months, and he or she has a fever and persistent symptoms.  The patient is a child older than 3 months, and he or she has a fever and symptoms suddenly get worse.  The patient is a baby, and he or she has no tears when crying. MAKE SURE YOU:   Understand these instructions.  Will watch your condition.  Will get help right away if you are not doing well or get worse. Document Released: 08/03/2005 Document Revised: 10/26/2011 Document Reviewed: 05/20/2011   [employer] Patient Information 2013 [place], [place]. Monilial Vaginitis Vaginitis in a soreness, swelling and redness (inflammation) of the vagina and vulva. Monilial vaginitis is not a sexually transmitted infection. CAUSES  Yeast vaginitis is caused by yeast (candida) that is normally found in your vagina. With a yeast infection,  the candida has overgrown in number to a point that upsets the chemical balance. SYMPTOMS   White, thick vaginal discharge.  Swelling, itching, redness and irritation of the vagina and possibly the lips of the vagina (vulva).  Burning or painful urination.  Painful intercourse. DIAGNOSIS  Things that may contribute to monilial vaginitis are:  Postmenopausal and virginal states.  Pregnancy.  Infections.  Being tired, sick or stressed, especially if you had monilial vaginitis in the past.  Diabetes. Good control will help lower the chance.  Birth control pills.  Tight fitting garments.  Using bubble bath, feminine sprays, douches or deodorant tampons.  Taking certain medications that kill germs (antibiotics).  Sporadic recurrence can occur if you become ill. TREATMENT  Your caregiver will give you medication.  There are several kinds of anti monilial vaginal creams and suppositories specific for monilial vaginitis. For recurrent yeast infections, use a suppository or cream in the vagina 2 times a week, or as directed.  Anti-monilial or steroid cream for the itching or irritation of the vulva may also be used. Get your caregiver's permission.  Painting the vagina with methylene blue solution may help if the monilial cream does not work.  Eating yogurt may help prevent monilial vaginitis. HOME CARE INSTRUCTIONS   Finish all medication as prescribed.  Do not have sex until treatment is completed or after your caregiver tells you it is okay.  Take warm sitz baths.  Do not douche.  Do not use tampons, especially scented ones.  Wear cotton underwear.  Avoid tight pants and panty hose.  Tell your sexual partner that you have a yeast infection. They should go to their caregiver if they have symptoms such as mild rash or itching.  Your sexual partner should be treated as well if your infection is difficult to eliminate.  Practice safer sex. Use condoms.  Some  vaginal medications cause latex condoms to fail. Vaginal medications that harm condoms are:  Cleocin cream.  Butoconazole (Femstat).  Terconazole (Terazol) vaginal suppository.  Miconazole (Monistat) (may be purchased over the counter). SEEK MEDICAL CARE IF:   You have a temperature by mouth above 102 F (38.9 C).  The infection is getting worse after 2 days of treatment.  The infection is not getting better after 3 days of treatment.  You develop blisters in or around your vagina.  You develop vaginal bleeding, and it is not your menstrual period.  You have pain when you urinate.  You develop intestinal problems.  You have pain with sexual intercourse. Document Released: 05/13/2005 Document Revised: 10/26/2011 Document Reviewed: 01/25/2009 [facility] Patient Information 2013 [place], [place].

## 2012-09-02 NOTE — Progress Notes (Signed)
Patient ID: Joyce Rice, female   DOB: 08/10/1986, 27 y.o.   MRN: [id number]  History:  Joyce Rice is a 27 y.o. [phone] who presents to clinic today for abdominal pain and vaginal discharge. The patient was seen in [facility] on 08/21/12 for abdominal pain and diagnosed with gastroenteritis and food intolerance. The patient was given phenergan for nausea and told to follow-up with [facility] for a GI exam and possible referral. The patient has not done this yet. She states that her pain has improved since that visit, however it has not gone away. She is not taking anything for the pain. She does feel that she is eating better with less fast food. She denies N/V/D or fever. She had a URI earlier this week and was seen at [facility]. She was put on antibiotics at that time. She has noticed some vaginal discharge for ~ 1 week. She is concerned about yeast. The discharge is thin and white with a mild odor. She does have some itching. She denies dysuria, frequency, urgency. The patient does have a history of STDs including PID. Her and her partner were treated adequately according to the patient.   The following portions of the patient's history were reviewed and updated as appropriate: allergies, current medications, past family history, past medical history, past social history, past surgical history and problem list.  Review of Systems:  Pertinent items are noted in HPI.  Objective:  Physical Exam BP 110/63  Pulse 75  Temp 99.1 F (37.3 C) (Oral)  Ht 5\' 2"  (1.575 m)  Wt 136 lb (61.689 kg)  BMI 24.87 kg/m2 GENERAL: Well-developed, well-nourished female in no acute distress.  HEENT: Normocephalic, atraumatic.  LUNGS: Normal rate. Clear to auscultation bilaterally.  HEART: Regular rate and rhythm with no adventitious sounds.  ABDOMEN: Soft, nontender, nondistended. No organomegaly.  PELVIC: Normal external female genitalia. Vagina is pink and rugated.  Normal discharge. Normal cervix contour. Aptima swab  obtained for GC/Chlamydia, Trich, Yeast and BV. Uterus is normal in size. No adnexal mass or tenderness.  EXTREMITIES: No cyanosis, clubbing, or edema.   Labs and Imaging Aptima sent  Assessment & Plan:  Assessment: Abdominal pain  Plans: Aptima swab sent. Will call patient with results if treatment is necessary. Patient may use OTC anti-itch creams for vaginal area if itching continues to be a concern.  Patient encouraged to make an appointment with [facility] to follow-up about possible GI origin of abdominal pain. They may refer for GI consult.  Patient encouraged to continue improved diet.    Freddi Starr, PA-C 09/02/2012 9:30 AM

## 2012-09-08 ENCOUNTER — Other Ambulatory Visit: Payer: Self-pay | Admitting: Medical

## 2012-09-08 DIAGNOSIS — N760 Acute vaginitis: Secondary | ICD-10-CM

## 2012-09-08 MED ORDER — TINIDAZOLE 500 MG PO TABS: 1.0000 g | ORAL_TABLET | Freq: Two times a day (BID) | ORAL | Status: DC

## 2012-09-09 ENCOUNTER — Telehealth: Payer: Self-pay | Admitting: *Deleted

## 2012-09-09 DIAGNOSIS — B373 Candidiasis of vulva and vagina: Secondary | ICD-10-CM

## 2012-09-09 MED ORDER — FLUCONAZOLE 150 MG PO TABS: 150.0000 mg | ORAL_TABLET | Freq: Once | ORAL | Status: DC

## 2012-09-09 NOTE — Telephone Encounter (Signed)
Pt informed. She also requested a prescription for diflucan which Raynelle Fanning said was okay to send as well.

## 2012-09-09 NOTE — Telephone Encounter (Signed)
Pt left a message requesting results. See message from julie below.

## 2012-09-09 NOTE — Telephone Encounter (Signed)
Message copied by Mannie Stabile on Fri Sep 09, 2012 11:57 AM ------      Message from: Freddi Starr      Created: Thu Sep 08, 2012  1:24 PM       Patient has BV. Rx for Tindamax sent to patient's pharmacy. Please inform patient of dx and Rx.             Thanks!            Raynelle Fanning

## 2012-10-04 ENCOUNTER — Ambulatory Visit (INDEPENDENT_AMBULATORY_CARE_PROVIDER_SITE_OTHER): Payer: Medicaid Other

## 2012-10-04 VITALS — BP 108/63 | HR 61 | Temp 97.5°F | Ht 63.0 in | Wt 138.1 lb

## 2012-10-04 DIAGNOSIS — Z3049 Encounter for surveillance of other contraceptives: Secondary | ICD-10-CM

## 2012-10-04 DIAGNOSIS — IMO0001 Reserved for inherently not codable concepts without codable children: Secondary | ICD-10-CM

## 2012-10-04 MED ORDER — MEDROXYPROGESTERONE ACETATE 150 MG/ML IM SUSP
150.0000 mg | Freq: Once | INTRAMUSCULAR | Status: AC
  Administered 2012-10-04: 150 mg via INTRAMUSCULAR

## 2012-11-17 ENCOUNTER — Encounter (HOSPITAL_COMMUNITY): Payer: Self-pay | Admitting: Emergency Medicine

## 2012-11-17 ENCOUNTER — Emergency Department (HOSPITAL_COMMUNITY)
Admission: EM | Admit: 2012-11-17 | Discharge: 2012-11-17 | Disposition: A | Discharge status: Home / Self Care | Payer: Medicaid Other | Attending: Emergency Medicine | Admitting: Emergency Medicine

## 2012-11-17 DIAGNOSIS — J069 Acute upper respiratory infection, unspecified: Secondary | ICD-10-CM | POA: Insufficient documentation

## 2012-11-17 DIAGNOSIS — R05 Cough: Secondary | ICD-10-CM | POA: Insufficient documentation

## 2012-11-17 DIAGNOSIS — J3489 Other specified disorders of nose and nasal sinuses: Secondary | ICD-10-CM | POA: Insufficient documentation

## 2012-11-17 DIAGNOSIS — Z8742 Personal history of other diseases of the female genital tract: Secondary | ICD-10-CM | POA: Insufficient documentation

## 2012-11-17 DIAGNOSIS — Z8619 Personal history of other infectious and parasitic diseases: Secondary | ICD-10-CM | POA: Insufficient documentation

## 2012-11-17 DIAGNOSIS — R059 Cough, unspecified: Secondary | ICD-10-CM | POA: Insufficient documentation

## 2012-11-17 DIAGNOSIS — R1110 Vomiting, unspecified: Secondary | ICD-10-CM | POA: Insufficient documentation

## 2012-11-17 LAB — RAPID STREP SCREEN (MED CTR MEBANE ONLY): Streptococcus, Group A Screen (Direct): NEGATIVE

## 2012-11-17 MED ORDER — HYDROCODONE-ACETAMINOPHEN 7.5-325 MG/15ML PO SOLN: 15.0000 mL | Freq: Four times a day (QID) | ORAL | Status: DC | PRN

## 2012-11-17 MED ORDER — GUAIFENESIN 100 MG/5ML PO LIQD: 100.0000 mg | ORAL | Status: DC | PRN

## 2012-11-17 NOTE — ED Provider Notes (Signed)
Medical screening examination/treatment/procedure(s) were performed by non-physician practitioner and as supervising physician I was immediately available for consultation/collaboration.   Celene Kras, MD 11/17/12 [phone]

## 2012-11-17 NOTE — ED Notes (Signed)
Sore throat x 3 days

## 2012-11-17 NOTE — ED Notes (Signed)
Voiced understanding of instructions given 

## 2012-11-17 NOTE — ED Provider Notes (Signed)
History     CSN: [id number]  Arrival date & time 11/17/12  3086   First MD Initiated Contact with Patient 11/17/12 [phone]      Chief Complaint  Patient presents with  . Sore Throat    (Consider location/radiation/quality/duration/timing/severity/associated sxs/prior treatment) HPI  27 year old female presents complaining of sore throat. Patient reports for the past 3 days she has had nasal congestion, sore throat, coughing and sneezing. States cold water worsened her sore throat. She did vomit once yesterday and complaining of burning sensation to the throat after vomit. She reports subjective fever. She denies headache, vision changes, ear pain, dental pain, chest pain, shortness of breath, abdominal pain, or rash. She has tried taking some cough drops with minimal relief. No recent sick contact. Patient is not a smoker. No significant prior history of strep throat.  Past Medical History  Diagnosis Date  . Bartholin cyst   . Irregular periods/menstrual cycles   . Chlamydia     Past Surgical History  Procedure Laterality Date  . Dilation and curettage of uterus      No family history on file.  History  Substance Use Topics  . Smoking status: Never Smoker   . Smokeless tobacco: Never Used  . Alcohol Use: No    OB History   Grav Para Term Preterm Abortions TAB SAB Ect Mult Living   4 1 1  0 3 3 0 0 0 1      Review of Systems  Constitutional:       10 Systems reviewed and all are negative for acute change except as noted in the HPI.     Allergies  Review of patient's allergies indicates no known allergies.  Home Medications   Current Outpatient Rx  Name  Route  Sig  Dispense  Refill  . fluconazole (DIFLUCAN) 150 MG tablet   Oral   Take 1 tablet (150 mg total) by mouth once.   1 tablet   0   . EXPIRED: promethazine (PHENERGAN) 12.5 MG tablet   Oral   Take 1 tablet (12.5 mg total) by mouth every 6 (six) hours as needed for nausea.   30 tablet   0   .  tinidazole (TINDAMAX) 500 MG tablet   Oral   Take 2 tablets (1,000 mg total) by mouth 2 (two) times daily.   8 tablet   0     BP 109/69  Pulse 73  Temp(Src) 97.8 F (36.6 C) (Oral)  Resp 17  SpO2 97%  Physical Exam  Nursing note and vitals reviewed. Constitutional: She appears well-developed and well-nourished. No distress.  HENT:  Head: Atraumatic.  Right Ear: External ear normal.  Left Ear: External ear normal.  Nose: Nose normal.  Mouth/Throat: No oropharyngeal exudate.  Throat: uvula midline, mild erythema to post oropharyngeal region.  No tonsillar enlargement or exudates.  No evidence of deep tissue infection.  No trismus.  Eyes: Conjunctivae are normal.  Neck: Neck supple.  Cardiovascular: Normal rate and regular rhythm.   Abdominal: There is no tenderness.  Lymphadenopathy:    She has cervical adenopathy.  Neurological: She is alert.  Skin: Skin is warm. No rash noted.  Psychiatric: She has a normal mood and affect.    ED Course  Procedures (including critical care time)  9:16 AM Patient with cold symptoms suggestive of viral infection. She has no evidence of deep tissue infection.  9:32 AM Strep test negative.  Will treat sxs.  Return precaution discussed.    Labs  Reviewed  RAPID STREP SCREEN   No results found.   1. URI (upper respiratory infection)       MDM  BP 109/69  Pulse 73  Temp(Src) 97.8 F (36.6 C) (Oral)  Resp 17  SpO2 97%         Fayrene Helper, PA-C 11/17/12 [phone]

## 2012-11-29 ENCOUNTER — Encounter (HOSPITAL_COMMUNITY): Payer: Self-pay | Admitting: Emergency Medicine

## 2012-11-29 ENCOUNTER — Emergency Department (HOSPITAL_COMMUNITY)
Admission: EM | Admit: 2012-11-29 | Discharge: 2012-11-29 | Disposition: A | Discharge status: Home / Self Care | Payer: Medicaid Other | Attending: Emergency Medicine | Admitting: Emergency Medicine

## 2012-11-29 ENCOUNTER — Emergency Department (HOSPITAL_COMMUNITY): Payer: Medicaid Other

## 2012-11-29 DIAGNOSIS — R102 Pelvic and perineal pain unspecified side: Secondary | ICD-10-CM

## 2012-11-29 DIAGNOSIS — N949 Unspecified condition associated with female genital organs and menstrual cycle: Secondary | ICD-10-CM | POA: Insufficient documentation

## 2012-11-29 DIAGNOSIS — R350 Frequency of micturition: Secondary | ICD-10-CM | POA: Insufficient documentation

## 2012-11-29 DIAGNOSIS — Z8619 Personal history of other infectious and parasitic diseases: Secondary | ICD-10-CM | POA: Insufficient documentation

## 2012-11-29 DIAGNOSIS — N390 Urinary tract infection, site not specified: Secondary | ICD-10-CM

## 2012-11-29 DIAGNOSIS — Z3202 Encounter for pregnancy test, result negative: Secondary | ICD-10-CM | POA: Insufficient documentation

## 2012-11-29 DIAGNOSIS — Z8742 Personal history of other diseases of the female genital tract: Secondary | ICD-10-CM | POA: Insufficient documentation

## 2012-11-29 LAB — PREGNANCY, URINE: Preg Test, Ur: NEGATIVE

## 2012-11-29 LAB — URINALYSIS, ROUTINE W REFLEX MICROSCOPIC
Nitrite: NEGATIVE
Specific Gravity, Urine: 1.023 (ref 1.005–1.030)
Urobilinogen, UA: 0.2 mg/dL (ref 0.0–1.0)
pH: 5.5 (ref 5.0–8.0)

## 2012-11-29 LAB — URINE MICROSCOPIC-ADD ON

## 2012-11-29 MED ORDER — PHENAZOPYRIDINE HCL 200 MG PO TABS: 200.0000 mg | ORAL_TABLET | Freq: Three times a day (TID) | ORAL | Status: DC | PRN

## 2012-11-29 MED ORDER — CEPHALEXIN 500 MG PO CAPS
500.0000 mg | ORAL_CAPSULE | Freq: Once | ORAL | Status: AC
  Administered 2012-11-29: 500 mg via ORAL
  Filled 2012-11-29: qty 1

## 2012-11-29 MED ORDER — CEPHALEXIN 500 MG PO CAPS: 500.0000 mg | ORAL_CAPSULE | Freq: Three times a day (TID) | ORAL | Status: DC

## 2012-11-29 MED ORDER — KETOROLAC TROMETHAMINE 60 MG/2ML IM SOLN
60.0000 mg | Freq: Once | INTRAMUSCULAR | Status: AC
  Administered 2012-11-29: 60 mg via INTRAMUSCULAR
  Filled 2012-11-29: qty 2

## 2012-11-29 MED ORDER — PHENAZOPYRIDINE HCL 200 MG PO TABS
200.0000 mg | ORAL_TABLET | Freq: Once | ORAL | Status: AC
  Administered 2012-11-29: 200 mg via ORAL
  Filled 2012-11-29: qty 1

## 2012-11-29 NOTE — ED Notes (Signed)
Patient transported to Ultrasound 

## 2012-11-29 NOTE — ED Provider Notes (Signed)
History     CSN: [id number]  Arrival date & time 11/29/12  0913   First MD Initiated Contact with Patient 11/29/12 [phone]      Chief Complaint  Patient presents with  . Abdominal Pain    (Consider location/radiation/quality/duration/timing/severity/associated sxs/prior treatment) HPI PT states she is [id number] (abortions), on depoprovera and has menses about once a year. She reports she's been having sharp intermittent lower abdominal pains off and on for the past couple weeks. She relates they are mainly on the right side although rarely they can be on the left side. She reports it only lasted a few minutes. She states if she has prolonged standing or walking the pain seemed to get more often and intense. She states sitting down seems to keep it from coming on as often. She states she has been having vaginal spotting which is unusual. She denies nausea, vomiting, diarrhea, dysuria, vaginal discharge, no dyspareunia. She states she has normal appetite. She states she is having urinary frequency however. She states she has had the same sexual partner for the past 6 years, they do not use protection. She states she's never had this pain in the past. She states she's never been told she has an ovarian cyst.  PCP none GYN women's health  Past Medical History  Diagnosis Date  . Bartholin cyst   . Irregular periods/menstrual cycles   . Chlamydia     Past Surgical History  Procedure Laterality Date  . Dilation and curettage of uterus      No family history on file.  History  Substance Use Topics  . Smoking status: Never Smoker   . Smokeless tobacco: Never Used  . Alcohol Use: No  employed  OB History   Grav Para Term Preterm Abortions TAB SAB Ect Mult Living   4 1 1  0 3 3 0 0 0 1      Review of Systems  All other systems reviewed and are negative.    Allergies  Review of patient's allergies indicates no known allergies.  Home Medications   Current Outpatient Rx  Name   Route  Sig  Dispense  Refill  . guaiFENesin (ROBITUSSIN) 100 MG/5ML liquid   Oral   Take 5-10 mLs (100-200 mg total) by mouth every 4 (four) hours as needed for cough.   60 mL   0   . HYDROcodone-acetaminophen (HYCET) 7.5-325 mg/15 ml solution   Oral   Take 15 mLs by mouth 4 (four) times daily as needed for pain or cough.   120 mL   0     BP 111/75  Pulse 64  Temp(Src) 98.4 F (36.9 C) (Oral)  Resp 16  SpO2 98%  Vital signs normal    Physical Exam  Nursing note and vitals reviewed. Constitutional: She is oriented to person, place, and time. She appears well-developed and well-nourished.  Non-toxic appearance. She does not appear ill. No distress.  HENT:  Head: Normocephalic and atraumatic.  Right Ear: External ear normal.  Left Ear: External ear normal.  Nose: Nose normal. No mucosal edema or rhinorrhea.  Mouth/Throat: Oropharynx is clear and moist and mucous membranes are normal. No dental abscesses or edematous.  Eyes: Conjunctivae and EOM are normal. Pupils are equal, round, and reactive to light.  Neck: Normal range of motion and full passive range of motion without pain. Neck supple.  Cardiovascular: Normal rate, regular rhythm and normal heart sounds.  Exam reveals no gallop and no friction rub.   No murmur  heard. Pulmonary/Chest: Effort normal and breath sounds normal. No respiratory distress. She has no wheezes. She has no rhonchi. She has no rales. She exhibits no tenderness and no crepitus.  Abdominal: Soft. Normal appearance and bowel sounds are normal. She exhibits no distension. There is no tenderness. There is no rebound and no guarding.  Genitourinary:  Normal external genitalia, small amount white discharge seen. Nontender uterus and ovaries to palpation at this time however exam was difficult because patient was very tense. There was no blood seen.  Musculoskeletal: Normal range of motion. She exhibits no edema and no tenderness.  Moves all extremities  well.   Neurological: She is alert and oriented to person, place, and time. She has normal strength. No cranial nerve deficit.  Skin: Skin is warm, dry and intact. No rash noted. No erythema. No pallor.  Psychiatric: She has a normal mood and affect. Her speech is normal and behavior is normal. Her mood appears not anxious.    ED Course  Procedures (including critical care time) Medications  phenazopyridine (PYRIDIUM) tablet 200 mg (not administered)  cephALEXin (KEFLEX) capsule 500 mg (not administered)  ketorolac (TORADOL) injection 60 mg (60 mg Intramuscular Given 11/29/12 1111)      At time of pelvic exam which was about 10:50 AM patient states she has been to the bathroom to urinate at least 3-4 times.    Results for orders placed during the hospital encounter of 11/29/12  WET PREP, GENITAL      Result Value Range   Yeast Wet Prep HPF POC NONE SEEN  NONE SEEN   Trich, Wet Prep NONE SEEN  NONE SEEN   Clue Cells Wet Prep HPF POC NONE SEEN  NONE SEEN   WBC, Wet Prep HPF POC FEW (*) NONE SEEN  URINALYSIS, ROUTINE W REFLEX MICROSCOPIC      Result Value Range   Color, Urine YELLOW  YELLOW   APPearance CLOUDY (*) CLEAR   Specific Gravity, Urine 1.023  1.005 - 1.030   pH 5.5  5.0 - 8.0   Glucose, UA NEGATIVE  NEGATIVE mg/dL   Hgb urine dipstick MODERATE (*) NEGATIVE   Bilirubin Urine NEGATIVE  NEGATIVE   Ketones, ur NEGATIVE  NEGATIVE mg/dL   Protein, ur NEGATIVE  NEGATIVE mg/dL   Urobilinogen, UA 0.2  0.0 - 1.0 mg/dL   Nitrite NEGATIVE  NEGATIVE   Leukocytes, UA LARGE (*) NEGATIVE  PREGNANCY, URINE      Result Value Range   Preg Test, Ur NEGATIVE  NEGATIVE  URINE MICROSCOPIC-ADD ON      Result Value Range   Squamous Epithelial / LPF FEW (*) RARE   WBC, UA 21-50  <3 WBC/hpf   RBC / HPF 3-6  <3 RBC/hpf   Bacteria, UA MANY (*) RARE   Laboratory interpretation all normal except UTI    US Transvaginal Non-ob US Pelvis Complete  11/29/2012  *RADIOLOGY REPORT*  Clinical  Data: Intermittent right lower quadrant abdominal pain. LMP approximately 1 year ago on Depo-Provera therapy.  TRANSABDOMINAL AND TRANSVAGINAL ULTRASOUND OF PELVIS Technique:  Both transabdominal and transvaginal ultrasound examinations of the pelvis were performed. Transabdominal technique was performed for global imaging of the pelvis including uterus, ovaries, adnexal regions, and pelvic cul-de-sac.  It was necessary to proceed with endovaginal exam following the transabdominal exam to visualize the endometrium and ovaries to better advantage.  Comparison:  None  Findings:  Uterus: The uterus is retroverted and measures approximately 5.7 x 2.1 x 3.8 cm.  The  myometrium is homogeneous.  Endometrium: The endometrium is thin, measuring 1.2 mm in thickness.  There is a small amount of fluid in the endometrial canal.  Right ovary:  Normal in appearance, measuring 2.8 x 1.8 x 1.2 cm. Blood flow is present with color Doppler.  Left ovary: Normal in appearance measuring 2.9 x 2.2 x 1.3 cm. Blood flow is present with color Doppler.  Other findings: A trace amount of free pelvic fluid is within physiologic limits.  IMPRESSION: Normal study. No evidence of pelvic mass or other significant abnormality.   Original Report Authenticated By: Carey Bullocks, M.D.      1. Pelvic pain   2. UTI (lower urinary tract infection)    New Prescriptions   CEPHALEXIN (KEFLEX) 500 MG CAPSULE    Take 1 capsule (500 mg total) by mouth 3 (three) times daily.   PHENAZOPYRIDINE (PYRIDIUM) 200 MG TABLET    Take 1 tablet (200 mg total) by mouth 3 (three) times daily as needed (pain on urination).    Plan discharge  Devoria Albe, MD, FACEP   MDM  Pt has frequency with abnormal UA c/w UTI. The etiology of her intermittant fleeting pelvic pain is uncertain.           Ward Givens, MD 11/29/12 1331

## 2012-11-29 NOTE — ED Notes (Signed)
Pt is on the Depo shot for birth control and states that she normally does not spot but recently has been spotting everyday. It started off pink than got brown and now is pink again. States pain in abdomen started around same time of spotting

## 2012-11-29 NOTE — ED Notes (Signed)
Pt c/o of right sided lower abdominal pain that has been going on for a while now. States it was so bad yesterday that she had to sit down and couldn't walk. No n/v.

## 2012-11-30 LAB — GC/CHLAMYDIA PROBE AMP
CT Probe RNA: NEGATIVE
GC Probe RNA: NEGATIVE

## 2012-11-30 LAB — URINE CULTURE

## 2012-12-23 ENCOUNTER — Ambulatory Visit (INDEPENDENT_AMBULATORY_CARE_PROVIDER_SITE_OTHER): Payer: Medicaid Other | Admitting: *Deleted

## 2012-12-23 VITALS — BP 104/65 | HR 66 | Resp 20 | Wt 139.5 lb

## 2012-12-23 DIAGNOSIS — Z3049 Encounter for surveillance of other contraceptives: Secondary | ICD-10-CM

## 2012-12-23 DIAGNOSIS — Z3042 Encounter for surveillance of injectable contraceptive: Secondary | ICD-10-CM

## 2012-12-23 MED ORDER — MEDROXYPROGESTERONE ACETATE 150 MG/ML IM SUSP
150.0000 mg | Freq: Once | INTRAMUSCULAR | Status: AC
  Administered 2012-12-23: 150 mg via INTRAMUSCULAR

## 2012-12-23 NOTE — Progress Notes (Signed)
Pt here for scheduled Depo Provera injection- no c/o.

## 2013-03-10 ENCOUNTER — Ambulatory Visit (INDEPENDENT_AMBULATORY_CARE_PROVIDER_SITE_OTHER): Payer: Medicaid Other | Admitting: *Deleted

## 2013-03-10 VITALS — BP 98/60 | HR 77 | Temp 99.0°F

## 2013-03-10 DIAGNOSIS — Z3049 Encounter for surveillance of other contraceptives: Secondary | ICD-10-CM

## 2013-03-10 MED ORDER — MEDROXYPROGESTERONE ACETATE 150 MG/ML IM SUSP
150.0000 mg | Freq: Once | INTRAMUSCULAR | Status: AC
  Administered 2013-03-10: 150 mg via INTRAMUSCULAR

## 2013-03-10 NOTE — Progress Notes (Signed)
Here for depoprovera. Per chart review last pap and annual exam 08/2011. Instructed patient may get depoprovera today, but next appointment needs to be annual exam and shot.

## 2013-05-29 ENCOUNTER — Ambulatory Visit (INDEPENDENT_AMBULATORY_CARE_PROVIDER_SITE_OTHER): Payer: Medicaid Other | Admitting: *Deleted

## 2013-05-29 VITALS — BP 99/68 | HR 65 | Temp 99.0°F | Wt 137.4 lb

## 2013-05-29 DIAGNOSIS — Z3049 Encounter for surveillance of other contraceptives: Secondary | ICD-10-CM

## 2013-05-29 MED ORDER — MEDROXYPROGESTERONE ACETATE 104 MG/0.65ML ~~LOC~~ SUSP
104.0000 mg | Freq: Once | SUBCUTANEOUS | Status: AC
  Administered 2013-05-29: 104 mg via SUBCUTANEOUS

## 2013-05-29 NOTE — Progress Notes (Signed)
Notified patient needs annual exam either before or with next depoprovera

## 2013-07-18 ENCOUNTER — Encounter (HOSPITAL_COMMUNITY): Payer: Self-pay | Admitting: Emergency Medicine

## 2013-07-18 ENCOUNTER — Emergency Department (HOSPITAL_COMMUNITY)
Admission: EM | Admit: 2013-07-18 | Discharge: 2013-07-18 | Disposition: A | Discharge status: Home / Self Care | Payer: Medicaid Other | Attending: Emergency Medicine | Admitting: Emergency Medicine

## 2013-07-18 DIAGNOSIS — Z8619 Personal history of other infectious and parasitic diseases: Secondary | ICD-10-CM | POA: Insufficient documentation

## 2013-07-18 DIAGNOSIS — K5289 Other specified noninfective gastroenteritis and colitis: Secondary | ICD-10-CM | POA: Insufficient documentation

## 2013-07-18 DIAGNOSIS — R079 Chest pain, unspecified: Secondary | ICD-10-CM | POA: Insufficient documentation

## 2013-07-18 DIAGNOSIS — R51 Headache: Secondary | ICD-10-CM | POA: Insufficient documentation

## 2013-07-18 DIAGNOSIS — B9789 Other viral agents as the cause of diseases classified elsewhere: Secondary | ICD-10-CM | POA: Insufficient documentation

## 2013-07-18 DIAGNOSIS — B349 Viral infection, unspecified: Secondary | ICD-10-CM

## 2013-07-18 DIAGNOSIS — Z79899 Other long term (current) drug therapy: Secondary | ICD-10-CM | POA: Insufficient documentation

## 2013-07-18 DIAGNOSIS — Z3202 Encounter for pregnancy test, result negative: Secondary | ICD-10-CM | POA: Insufficient documentation

## 2013-07-18 DIAGNOSIS — K529 Noninfective gastroenteritis and colitis, unspecified: Secondary | ICD-10-CM

## 2013-07-18 DIAGNOSIS — Z8742 Personal history of other diseases of the female genital tract: Secondary | ICD-10-CM | POA: Insufficient documentation

## 2013-07-18 LAB — CBC WITH DIFFERENTIAL/PLATELET
Basophils Relative: 0 % (ref 0–1)
Eosinophils Absolute: 0.1 10*3/uL (ref 0.0–0.7)
Hemoglobin: 14.4 g/dL (ref 12.0–15.0)
MCH: 30.4 pg (ref 26.0–34.0)
MCHC: 34.6 g/dL (ref 30.0–36.0)
Monocytes Absolute: 0.5 10*3/uL (ref 0.1–1.0)
Monocytes Relative: 10 % (ref 3–12)
Neutrophils Relative %: 40 % — ABNORMAL LOW (ref 43–77)

## 2013-07-18 LAB — COMPREHENSIVE METABOLIC PANEL
Albumin: 4.5 g/dL (ref 3.5–5.2)
BUN: 13 mg/dL (ref 6–23)
Creatinine, Ser: 0.79 mg/dL (ref 0.50–1.10)
Potassium: 3.2 mEq/L — ABNORMAL LOW (ref 3.5–5.1)
Total Protein: 8.5 g/dL — ABNORMAL HIGH (ref 6.0–8.3)

## 2013-07-18 LAB — URINALYSIS, ROUTINE W REFLEX MICROSCOPIC
Glucose, UA: NEGATIVE mg/dL
Leukocytes, UA: NEGATIVE
Specific Gravity, Urine: 1.04 — ABNORMAL HIGH (ref 1.005–1.030)
pH: 6 (ref 5.0–8.0)

## 2013-07-18 LAB — CG4 I-STAT (LACTIC ACID): Lactic Acid, Venous: 1.01 mmol/L (ref 0.5–2.2)

## 2013-07-18 LAB — PREGNANCY, URINE: Preg Test, Ur: NEGATIVE

## 2013-07-18 MED ORDER — SODIUM CHLORIDE 0.9 % IV BOLUS (SEPSIS)
1000.0000 mL | Freq: Once | INTRAVENOUS | Status: AC
  Administered 2013-07-18: 1000 mL via INTRAVENOUS

## 2013-07-18 MED ORDER — ONDANSETRON HCL 4 MG/2ML IJ SOLN
4.0000 mg | Freq: Once | INTRAMUSCULAR | Status: AC
  Administered 2013-07-18: 4 mg via INTRAVENOUS
  Filled 2013-07-18: qty 2

## 2013-07-18 MED ORDER — KETOROLAC TROMETHAMINE 30 MG/ML IJ SOLN
30.0000 mg | Freq: Once | INTRAMUSCULAR | Status: AC
  Administered 2013-07-18: 30 mg via INTRAVENOUS
  Filled 2013-07-18: qty 1

## 2013-07-18 MED ORDER — ONDANSETRON HCL 4 MG PO TABS: 4.0000 mg | ORAL_TABLET | Freq: Four times a day (QID) | ORAL | Status: DC

## 2013-07-18 MED ORDER — POTASSIUM CHLORIDE CRYS ER 20 MEQ PO TBCR
40.0000 meq | EXTENDED_RELEASE_TABLET | Freq: Once | ORAL | Status: AC
  Administered 2013-07-18: 40 meq via ORAL
  Filled 2013-07-18: qty 2

## 2013-07-18 NOTE — ED Notes (Signed)
Pt given some water and saltine crackers.

## 2013-07-18 NOTE — Progress Notes (Signed)
   CARE MANAGEMENT ED NOTE 07/18/2013  Patient:  LAREN, ORAMA   Account Number:  [id number]  Date Initiated:  07/18/2013  Documentation initiated by:  Radford Pax  Subjective/Objective Assessment:   Patient presents to ED with decresed appetite, n/v/ diarrhea.     Subjective/Objective Assessment Detail:     Action/Plan:   Action/Plan Detail:   Anticipated DC Date:       Status Recommendation to Physician:   Result of Recommendation:    Other ED Services  Consult Working Plan    DC Planning Services  Other  PCP issues    Choice offered to / List presented to:            Status of service:  Completed, signed off  ED Comments:   ED Comments Detail:  Patient confirms she does not have a pcp.  [facility] provided patient with a list of Medicaid accepting physicians in [facility].  Patient thankful for resources.

## 2013-07-18 NOTE — ED Notes (Signed)
Pt reports having chills. Marissa, PA made aware.

## 2013-07-18 NOTE — ED Notes (Signed)
Pt c/o n/v/d x 1 day. States she has not been able to eat anything since yesterday. Has been around someone with a stomach virus. Last vomit was this morning, diarrhea about 3 hours ago. C/o abd pain all over.

## 2013-07-18 NOTE — ED Provider Notes (Signed)
CSN: [id number]     Arrival date & time 07/18/13  1542 History   First MD Initiated Contact with Patient 07/18/13 1704     Chief Complaint  Patient presents with  . Nausea  . Emesis  . Diarrhea   (Consider location/radiation/quality/duration/timing/severity/associated sxs/prior Treatment) Patient is a 27 y.o. female presenting with vomiting and diarrhea. The history is provided by the patient. No language interpreter was used.  Emesis Associated symptoms: chills, diarrhea and headaches   Diarrhea Associated symptoms: chills, headaches and vomiting   Associated symptoms: no fever   Suzzette Gasparro is a 27 year old female with past medical history of Chlamydia, regular menstrual cycles, currently on Depo for birth control-last dose was administered in October 2014, presenting to emergency department with nausea, vomiting, diarrhea that started yesterday. As per patient, reported that she has been having generalized abdominal pain described as a cramping, soreness, aching sensation that is localized to the abdomen without radiation. Patient reports that every bowel movement she's been having has been loose, watery-denied blood, mucus. Patient reports she's been having emesis starting yesterday. Patient reported that she had approximately more than 5 episodes of emesis yesterday-reported that at first it started a food then liquid-NB/NB. Patient reported today she had 3 episodes of emesis, mainly of liquid consistency-NB/NB. Patient reports she's able to keep fluids down, reported she has not tried to eat any solids due to continuously feeling nauseous. Patient reports she's been having mild headache and chills. Patient reported that she was experiencing mild chest discomfort localized to the left side of the chest that occurred this morning, described as a sharp pain that lasted a couple seconds, reported that this episode only occurred once. Patient reported that her sister had a stomach virus  yesterday that she was not aware of. Denied fevers, dizziness, blurred vision, chest pain, shortness of breath, difficulty breathing, cough, congestion, melena, hematochezia. PCP none  Past Medical History  Diagnosis Date  . Bartholin cyst   . Irregular periods/menstrual cycles   . Chlamydia    Past Surgical History  Procedure Laterality Date  . Dilation and curettage of uterus     No family history on file. History  Substance Use Topics  . Smoking status: Never Smoker   . Smokeless tobacco: Never Used  . Alcohol Use: No   OB History   Grav Para Term Preterm Abortions TAB SAB Ect Mult Living   4 1 1  0 3 3 0 0 0 1     Review of Systems  Constitutional: Positive for chills. Negative for fever.  Respiratory: Negative for chest tightness and shortness of breath.   Cardiovascular: Positive for chest pain.  Gastrointestinal: Positive for nausea, vomiting and diarrhea. Negative for constipation, blood in stool and anal bleeding.  Musculoskeletal: Negative for back pain and neck pain.  Neurological: Positive for headaches. Negative for dizziness and weakness.  All other systems reviewed and are negative.    Allergies  Review of patient's allergies indicates no known allergies.  Home Medications   Current Outpatient Rx  Name  Route  Sig  Dispense  Refill  . ibuprofen (ADVIL,MOTRIN) 200 MG tablet   Oral   Take 200 mg by mouth every 6 (six) hours as needed (pain).         . medroxyPROGESTERone (DEPO-PROVERA) 150 MG/ML injection   Intramuscular   Inject 150 mg into the muscle every 3 (three) months.         . ondansetron (ZOFRAN) 4 MG tablet   Oral  Take 1 tablet (4 mg total) by mouth every 6 (six) hours.   12 tablet   0    BP 108/52  Pulse 61  Temp(Src) 98.2 F (36.8 C) (Oral)  Resp 16  SpO2 99% Physical Exam  Nursing note and vitals reviewed. Constitutional: She is oriented to person, place, and time. She appears well-developed and well-nourished. No  distress.  HENT:  Head: Normocephalic and atraumatic.  Mouth/Throat: No oropharyngeal exudate.  Dry mucous membranes noted Negative oral lesions  Eyes: Conjunctivae and EOM are normal. Pupils are equal, round, and reactive to light. Right eye exhibits no discharge. Left eye exhibits no discharge.  Neck: Normal range of motion. Neck supple. No tracheal deviation present.  Negative neck stiffness Negative nuchal rigidity Negative cervical lymphadenopathy Negative meningeal signs  Cardiovascular: Normal rate, regular rhythm and normal heart sounds.  Exam reveals no friction rub.   No murmur heard. Pulses:      Radial pulses are 2+ on the right side, and 2+ on the left side.  Pulmonary/Chest: Effort normal and breath sounds normal. No respiratory distress. She has no wheezes. She has no rales.  Abdominal: Soft. Bowel sounds are normal. She exhibits no distension. There is no tenderness. There is no guarding.  Negative acute abdomen, negative peritoneal signs Mild discomfort upon palpation to the abdomen, generalized  Musculoskeletal: Normal range of motion.  Lymphadenopathy:    She has no cervical adenopathy.  Neurological: She is alert and oriented to person, place, and time. She exhibits normal muscle tone. Coordination normal.  Skin: Skin is warm and dry. No rash noted. She is not diaphoretic. No erythema.  Psychiatric: She has a normal mood and affect. Her behavior is normal. Thought content normal.    ED Course  Procedures (including critical care time)  10:18 PM This provider re-assessed patient for the second time. Patient reported that she is not having any abdominal pain, reported that she no longer feels nauseous. Discussed with patient to try and eat something and if patient able to tolerate fluids then she is able to go home if it stays down.   10:47 PM This provider re-assessed the patient. Patient able to tolerate fluids and crackers PO without difficulty. Negative episodes  of emesis while in ED setting.  Results for orders placed during the hospital encounter of 07/18/13  CBC WITH DIFFERENTIAL      Result Value Range   WBC 4.6  4.0 - 10.5 K/uL   RBC 4.73  3.87 - 5.11 MIL/uL   Hemoglobin 14.4  12.0 - 15.0 g/dL   HCT 78.2  95.6 - 21.3 %   MCV 87.9  78.0 - 100.0 fL   MCH 30.4  26.0 - 34.0 pg   MCHC 34.6  30.0 - 36.0 g/dL   RDW 08.6  57.8 - 46.9 %   Platelets 234  150 - 400 K/uL   Neutrophils Relative % 40 (*) 43 - 77 %   Neutro Abs 1.8  1.7 - 7.7 K/uL   Lymphocytes Relative 48 (*) 12 - 46 %   Lymphs Abs 2.2  0.7 - 4.0 K/uL   Monocytes Relative 10  3 - 12 %   Monocytes Absolute 0.5  0.1 - 1.0 K/uL   Eosinophils Relative 2  0 - 5 %   Eosinophils Absolute 0.1  0.0 - 0.7 K/uL   Basophils Relative 0  0 - 1 %   Basophils Absolute 0.0  0.0 - 0.1 K/uL  COMPREHENSIVE METABOLIC PANEL  Result Value Range   Sodium 138  135 - 145 mEq/L   Potassium 3.2 (*) 3.5 - 5.1 mEq/L   Chloride 100  96 - 112 mEq/L   CO2 21  19 - 32 mEq/L   Glucose, Bld 83  70 - 99 mg/dL   BUN 13  6 - 23 mg/dL   Creatinine, Ser 1.61  0.50 - 1.10 mg/dL   Calcium 9.9  8.4 - 09.6 mg/dL   Total Protein 8.5 (*) 6.0 - 8.3 g/dL   Albumin 4.5  3.5 - 5.2 g/dL   AST 21  0 - 37 U/L   ALT 17  0 - 35 U/L   Alkaline Phosphatase 63  39 - 117 U/L   Total Bilirubin 0.5  0.3 - 1.2 mg/dL   GFR calc non Af Amer >90  >90 mL/min   GFR calc Af Amer >90  >90 mL/min  URINALYSIS, ROUTINE W REFLEX MICROSCOPIC      Result Value Range   Color, Urine AMBER (*) YELLOW   APPearance CLEAR  CLEAR   Specific Gravity, Urine 1.040 (*) 1.005 - 1.030   pH 6.0  5.0 - 8.0   Glucose, UA NEGATIVE  NEGATIVE mg/dL   Hgb urine dipstick NEGATIVE  NEGATIVE   Bilirubin Urine SMALL (*) NEGATIVE   Ketones, ur 40 (*) NEGATIVE mg/dL   Protein, ur NEGATIVE  NEGATIVE mg/dL   Urobilinogen, UA 1.0  0.0 - 1.0 mg/dL   Nitrite NEGATIVE  NEGATIVE   Leukocytes, UA NEGATIVE  NEGATIVE  PREGNANCY, URINE      Result Value Range   Preg  Test, Ur NEGATIVE  NEGATIVE  LIPASE, BLOOD      Result Value Range   Lipase 15  11 - 59 U/L  CG4 I-STAT (LACTIC ACID)      Result Value Range   Lactic Acid, Venous 1.01  0.5 - 2.2 mmol/L     Labs Review Labs Reviewed  CBC WITH DIFFERENTIAL - Abnormal; Notable for the following:    Neutrophils Relative % 40 (*)    Lymphocytes Relative 48 (*)    All other components within normal limits  COMPREHENSIVE METABOLIC PANEL - Abnormal; Notable for the following:    Potassium 3.2 (*)    Total Protein 8.5 (*)    All other components within normal limits  URINALYSIS, ROUTINE W REFLEX MICROSCOPIC - Abnormal; Notable for the following:    Color, Urine AMBER (*)    Specific Gravity, Urine 1.040 (*)    Bilirubin Urine SMALL (*)    Ketones, ur 40 (*)    All other components within normal limits  PREGNANCY, URINE  LIPASE, BLOOD  CG4 I-STAT (LACTIC ACID)   Imaging Review No results found.  EKG Interpretation    Date/Time:  Tuesday July 18 2013 19:05:46 EST Ventricular Rate:  69 PR Interval:  138 QRS Duration: 96 QT Interval:  414 QTC Calculation: 443 R Axis:   74 Text Interpretation:  Sinus rhythm RSR' in V1 or V2, probably normal variant Baseline wander in lead(s) V2 Confirmed by Fayrene Fearing  MD, MARK ([id number]) on 07/18/2013 9:16:30 PM            MDM   1. Gastroenteritis   2. Viral illness    Medications  sodium chloride 0.9 % bolus 1,000 mL (0 mLs Intravenous Stopped 07/18/13 2004)  ondansetron (ZOFRAN) injection 4 mg (4 mg Intravenous Given 07/18/13 1932)  sodium chloride 0.9 % bolus 1,000 mL (0 mLs Intravenous Stopped 07/18/13 2202)  ketorolac (TORADOL) 30 MG/ML injection 30 mg (30 mg Intravenous Given 07/18/13 2123)  ondansetron (ZOFRAN) injection 4 mg (4 mg Intravenous Given 07/18/13 2121)  potassium chloride SA (K-DUR,KLOR-CON) CR tablet 40 mEq (40 mEq Oral Given 07/18/13 2237)   Filed Vitals:   07/18/13 1606 07/18/13 2008 07/18/13 2230  BP: 95/73 92/60 108/52  Pulse: 67  61   Temp: 98.7 F (37.1 C) 99.1 F (37.3 C) 98.2 F (36.8 C)  TempSrc: Oral Oral Oral  Resp: 20 17 16   SpO2: 99%  99%    Patient presenting to emergency department with abdominal pain, nausea, vomiting, diarrhea does been ongoing since yesterday. Patient had one episode of chest tightness, described as a sharp pain localized left-sided her chest that occurred once, lasting a couple of seconds-reported that this never happened again. Denied fever, neck pain, neck stiffness, melena, hematochezia, blood or bile. Alert and oriented. GCS 15. Heart rate and rhythm normal. Lungs good auscultation bilaterally to upper and lower lobes. Pulses palpable and strong, radial 2+ bilaterally. Bowel sounds are normal active in all 4 quadrants, soft, mild discomfort upon palpation to all quadrants-negative guarding noted. Negative acute abdomen, negative peritoneal signs-nonsurgical abdomen identified. Dry mucous membranes noted-negative oral lesions identified. EKG negative ischemic findings identified. Lactic acid negative elevation. CBC negative elevation white blood cell count noted-negative leukocytosis. CMP noted mild hypokalemia -3.2. Lipase negative elevation. Urine pregnancy negative. Urinalysis noted high specific gravity-patient most likely dehydrated. Negative findings for nitrites, leukocytes-negative signs of infection. Patient given IV fluids and anti-emetics. Patient responded well.  Patient able to tolerate fluids and crackers PO without difficulty. Negative episodes of emesis while in ED setting. Patient stable, afebrile. Non-surgical abdomen noted. Doubt appendicitis. Doubt cholecystitis. Doubt colitis. Doubt diverticulitis. Doubt ectopic. Doubt ovarian torsion. Doubt PID. Patient does not meet the SIRS or Sepsis criteria. Suspicion to be viral gastroenteritis. Discharged patient. Discharged patient with anti-emetics. Discussed with patient to rest and stay hydrated. Discussed with patient diet -  BRAT diet for diarrhea. Discussed with patient to closely monitor symptoms and if symptoms are to worsen or change to report back to the ED - strict return instructions given.  Patient agreed to plan of care, understood, all questions answered.     Raymon Mutton, PA-C 07/19/13 1413

## 2013-07-27 NOTE — ED Provider Notes (Signed)
Medical screening examination/treatment/procedure(s) were performed by non-physician practitioner and as supervising physician I was immediately available for consultation/collaboration.  EKG Interpretation    Date/Time:  Tuesday July 18 2013 19:05:46 EST Ventricular Rate:  69 PR Interval:  138 QRS Duration: 96 QT Interval:  414 QTC Calculation: 443 R Axis:   74 Text Interpretation:  Sinus rhythm RSR' in V1 or V2, probably normal variant Baseline wander in lead(s) V2 Confirmed by Fayrene Fearing  MD, Crescencio Jozwiak ([id number]) on 07/18/2013 9:16:30 PM Also confirmed by Fayrene Fearing  MD, Endy Easterly ([id number]), editor [place]  CCT, BEVERLY ([id number])  on 07/19/2013 12:44:15 PM              Roney Marion, MD 07/27/13 [phone]

## 2013-08-25 ENCOUNTER — Ambulatory Visit (INDEPENDENT_AMBULATORY_CARE_PROVIDER_SITE_OTHER): Payer: Medicaid Other

## 2013-08-25 VITALS — BP 115/64 | HR 62 | Wt 145.1 lb

## 2013-08-25 DIAGNOSIS — B373 Candidiasis of vulva and vagina: Secondary | ICD-10-CM

## 2013-08-25 DIAGNOSIS — B3731 Acute candidiasis of vulva and vagina: Secondary | ICD-10-CM

## 2013-08-25 DIAGNOSIS — Z3049 Encounter for surveillance of other contraceptives: Secondary | ICD-10-CM

## 2013-08-25 LAB — POCT PREGNANCY, URINE: Preg Test, Ur: NEGATIVE

## 2013-08-25 MED ORDER — MEDROXYPROGESTERONE ACETATE 104 MG/0.65ML ~~LOC~~ SUSP
104.0000 mg | Freq: Once | SUBCUTANEOUS | Status: AC
  Administered 2013-08-25: 104 mg via SUBCUTANEOUS

## 2013-08-25 MED ORDER — FLUCONAZOLE 150 MG PO TABS: 150.0000 mg | ORAL_TABLET | Freq: Once | ORAL | Status: DC

## 2013-08-25 NOTE — Progress Notes (Signed)
Pt. Here today for depo-provera injection. Pt. States she has been gaining some weight, about 10lbs and thinks she might be pregnant because she did not gain weight with the depo 150mg -- requests pregnancy test. Pt. Also c/o of white discharge causing irritation/itching, similar to when she had a yeast infection before, though she states she had BV before and is unsure if it is that or yeast. Denies a funny smell or burning. Will prescribe Diflucan. Instructed pt. To make appointment to be seen if discharge does not improve.

## 2013-09-15 ENCOUNTER — Ambulatory Visit (INDEPENDENT_AMBULATORY_CARE_PROVIDER_SITE_OTHER): Payer: Medicaid Other | Admitting: Obstetrics & Gynecology

## 2013-09-15 ENCOUNTER — Encounter: Payer: Self-pay | Admitting: Obstetrics & Gynecology

## 2013-09-15 VITALS — BP 106/64 | HR 68 | Temp 99.9°F | Ht 63.0 in | Wt 144.6 lb

## 2013-09-15 DIAGNOSIS — N898 Other specified noninflammatory disorders of vagina: Secondary | ICD-10-CM

## 2013-09-15 LAB — POCT URINALYSIS DIP (DEVICE)
Bilirubin Urine: NEGATIVE
GLUCOSE, UA: NEGATIVE mg/dL
HGB URINE DIPSTICK: NEGATIVE
Ketones, ur: NEGATIVE mg/dL
Leukocytes, UA: NEGATIVE
Nitrite: NEGATIVE
PH: 5.5 (ref 5.0–8.0)
Protein, ur: NEGATIVE mg/dL
SPECIFIC GRAVITY, URINE: 1.025 (ref 1.005–1.030)
UROBILINOGEN UA: 0.2 mg/dL (ref 0.0–1.0)

## 2013-09-15 NOTE — Patient Instructions (Signed)

## 2013-09-15 NOTE — Progress Notes (Signed)
Subjective:     Patient ID: Joyce Rice, female   DOB: October 22, 1985, 28 y.o.   MRN: [id number]  HPI Pt presents with c/o 2 weeks of slight vaginal discharge and odor.  The odor is not fishy.  She reports that she has been monogamous for 7 years.     Review of Systems     Objective:   Physical Exam BP 106/64  Pulse 68  Temp(Src) 99.9 F (37.7 C) (Oral)  Ht 5\' 3"  (1.6 m)  Wt 144 lb 9.6 oz (65.59 kg)  BMI 25.62 kg/m2 Pt in NAD GU: EGBUS: no lesions Vagina: no blood in vault; no abnormal discharge noted Cervix: no lesion; no mucopurulent d/c; no CMT       Assessment:     Leukorrhea unknown etiology. Normal exam     Plan:     Wet smear and KOH done today rec unscented soap Will prescribe meds if wet smear abnormal

## 2013-09-16 LAB — WET PREP, GENITAL
Clue Cells Wet Prep HPF POC: NONE SEEN
Trich, Wet Prep: NONE SEEN
YEAST WET PREP: NONE SEEN

## 2013-09-19 ENCOUNTER — Telehealth: Payer: Self-pay | Admitting: *Deleted

## 2013-09-19 NOTE — Telephone Encounter (Signed)
Joyce Rice called and left a message requesting results.

## 2013-09-19 NOTE — Telephone Encounter (Signed)
Called Joyce Rice and informed her wet prep negative.  We discussed she is still having itching, has switched to [place]. States she washes inside vagina too, encouraged her to not wash inside vagina Discussed wet prep negative for yeast. .Encouraged her to use cotton underwear, find a detergent that doesn't irritate her and do not switch.  Advised if problems continue make appt to come back and be seen.

## 2013-10-08 ENCOUNTER — Encounter (HOSPITAL_COMMUNITY): Payer: Self-pay | Admitting: Emergency Medicine

## 2013-10-08 ENCOUNTER — Emergency Department (HOSPITAL_COMMUNITY)
Admission: EM | Admit: 2013-10-08 | Discharge: 2013-10-08 | Disposition: A | Discharge status: Home / Self Care | Payer: Medicaid Other | Attending: Emergency Medicine | Admitting: Emergency Medicine

## 2013-10-08 DIAGNOSIS — N760 Acute vaginitis: Secondary | ICD-10-CM | POA: Insufficient documentation

## 2013-10-08 DIAGNOSIS — B3731 Acute candidiasis of vulva and vagina: Secondary | ICD-10-CM | POA: Insufficient documentation

## 2013-10-08 DIAGNOSIS — B373 Candidiasis of vulva and vagina: Secondary | ICD-10-CM | POA: Insufficient documentation

## 2013-10-08 LAB — WET PREP, GENITAL
CLUE CELLS WET PREP: NONE SEEN
Trich, Wet Prep: NONE SEEN

## 2013-10-08 MED ORDER — AZITHROMYCIN 250 MG PO TABS
1000.0000 mg | ORAL_TABLET | Freq: Once | ORAL | Status: AC
  Administered 2013-10-08: 1000 mg via ORAL
  Filled 2013-10-08: qty 4

## 2013-10-08 MED ORDER — LIDOCAINE HCL 1 % IJ SOLN
INTRAMUSCULAR | Status: AC
  Administered 2013-10-08: 0.9 mL
  Filled 2013-10-08: qty 20

## 2013-10-08 MED ORDER — DOXYCYCLINE HYCLATE 100 MG PO CAPS: 100.0000 mg | ORAL_CAPSULE | Freq: Two times a day (BID) | ORAL | Status: DC

## 2013-10-08 MED ORDER — FLUCONAZOLE 150 MG PO TABS
150.0000 mg | ORAL_TABLET | Freq: Once | ORAL | Status: AC
Start: 2013-10-08 — End: 2013-10-08
  Administered 2013-10-08: 150 mg via ORAL
  Filled 2013-10-08: qty 1

## 2013-10-08 MED ORDER — CEFTRIAXONE SODIUM 250 MG IJ SOLR
250.0000 mg | Freq: Once | INTRAMUSCULAR | Status: AC
  Administered 2013-10-08: 250 mg via INTRAMUSCULAR
  Filled 2013-10-08: qty 250

## 2013-10-08 NOTE — ED Notes (Signed)
Pt reports vaginal itching x1 month - [facility] and was negative for yeast, BV, and trichomonas - pt was advised to changed laundry detergent and use a mild soap for cleansing - pt states she has been doing this and her vaginal area has continued to itch and last week she experienced burning and discomfort during intercourse - pt admits to very small amt of vaginal d/c which pt reports as her normal.

## 2013-10-08 NOTE — ED Provider Notes (Signed)
CSN: [id number]     Arrival date & time 10/08/13  2002 History   First MD Initiated Contact with Patient 10/08/13 2100     Chief Complaint  Patient presents with  . Vaginal Itching     (Consider location/radiation/quality/duration/timing/severity/associated sxs/prior Treatment) HPI Comments: Patient is a 28 year old female with a past medical history of Bartholin's cyst and Chlamydia who presents to the emergency department complaining of vaginal itching times one month. [facility], had a wet prep and urinalysis done which were normal, she was advised to change her detergent and soap to a mild soap at that time, however patient states despite making these changes, her symptoms have not improved. States her vagina itches she urinates and burns after intercourse. Denies abnormal discharge, states she always has a small amount of clear discharge. Denies vaginal bleeding, abdominal pain, pelvic pain, nausea, vomiting, dysuria, fever or chills. She is sexually active with one partner.  Patient is a 28 y.o. female presenting with vaginal itching. The history is provided by the patient.  Vaginal Itching    Past Medical History  Diagnosis Date  . Bartholin cyst   . Irregular periods/menstrual cycles   . Chlamydia    Past Surgical History  Procedure Laterality Date  . Dilation and curettage of uterus     History reviewed. No pertinent family history. History  Substance Use Topics  . Smoking status: Never Smoker   . Smokeless tobacco: Never Used  . Alcohol Use: No   OB History   Grav Para Term Preterm Abortions TAB SAB Ect Mult Living   4 1 1  0 3 3 0 0 0 1     Review of Systems  Genitourinary:       Positive for vaginal itching and burning.  All other systems reviewed and are negative.      Allergies  Review of patient's allergies indicates no known allergies.  Home Medications   Current Outpatient Rx  Name  Route  Sig  Dispense   Refill  . Multiple Vitamins-Minerals (MULTIVITAMIN WITH MINERALS) tablet   Oral   Take 1 tablet by mouth daily.         Marland Kitchen. doxycycline (VIBRAMYCIN) 100 MG capsule   Oral   Take 1 capsule (100 mg total) by mouth 2 (two) times daily. One po bid x 7 days   14 capsule   0   . medroxyPROGESTERone (DEPO-PROVERA) 150 MG/ML injection   Intramuscular   Inject 150 mg into the muscle every 3 (three) months.          BP 109/70  Pulse 61  Temp(Src) 99.1 F (37.3 C) (Oral)  Resp 14  SpO2 100% Physical Exam  Nursing note and vitals reviewed. Constitutional: She is oriented to person, place, and time. She appears well-developed and well-nourished. No distress.  HENT:  Head: Normocephalic and atraumatic.  Mouth/Throat: Oropharynx is clear and moist.  Eyes: Conjunctivae are normal.  Neck: Normal range of motion. Neck supple.  Cardiovascular: Normal rate, regular rhythm and normal heart sounds.   Pulmonary/Chest: Effort normal and breath sounds normal.  Abdominal: Soft. Bowel sounds are normal. There is no tenderness.  Genitourinary: There is no rash or tenderness on the right labia. There is no rash or tenderness on the left labia. Cervix exhibits no motion tenderness, no discharge and no friability. Right adnexum displays no mass, no tenderness and no fullness. Left adnexum displays no mass, no tenderness and no fullness. There is  erythema and tenderness around the vagina. No bleeding around the vagina. Vaginal discharge (white, clumpy) found.  Musculoskeletal: Normal range of motion. She exhibits no edema.  Neurological: She is alert and oriented to person, place, and time.  Skin: Skin is warm and dry. She is not diaphoretic.  Psychiatric: She has a normal mood and affect. Her behavior is normal.    ED Course  Procedures (including critical care time) Labs Review Labs Reviewed  WET PREP, GENITAL - Abnormal; Notable for the following:    Yeast Wet Prep HPF POC FEW (*)    WBC, Wet Prep  HPF POC TOO NUMEROUS TO COUNT (*)    All other components within normal limits  GC/CHLAMYDIA PROBE AMP   Imaging Review No results found.  EKG Interpretation   None       MDM   Final diagnoses:  Yeast vaginitis  Vaginitis   Pt presenting with worsening vaginal itching and burning. She appears in NAD, VSS. Wet prep showing few yeast and too numerous to count WBC. No CMT or adnexal tenderness. GC/chlamydia cultures pending. Will treat prophylactically for gc/chlamydia- rocephin, azithromycin, doxy. Diflucan for yeast. Stable for d/c. Return precautions given. Patient states understanding of treatment care plan and is agreeable.     Trevor Mace, PA-C 10/08/13 2316

## 2013-10-08 NOTE — Discharge Instructions (Signed)
You were treated today for yeast infection, gonorrhea and chlamydia. If these tests result positive, you will be informed and are obligated to inform your partner.   Vaginitis Vaginitis is an inflammation of the vagina. It is most often caused by a change in the normal balance of the bacteria and yeast that live in the vagina. This change in balance causes an overgrowth of certain bacteria or yeast, which causes the inflammation. There are different types of vaginitis, but the most common types are:  Bacterial vaginosis.  Yeast infection (candidiasis).  Trichomoniasis vaginitis. This is a sexually transmitted infection (STI).  Viral vaginitis.  Atropic vaginitis.  Allergic vaginitis. CAUSES  The cause depends on the type of vaginitis. Vaginitis can be caused by:  Bacteria (bacterial vaginosis).  Yeast (yeast infection).  A parasite (trichomoniasis vaginitis)  A virus (viral vaginitis).  Low hormone levels (atrophic vaginitis). Low hormone levels can occur during pregnancy, breastfeeding, or after menopause.  Irritants, such as bubble baths, scented tampons, and feminine sprays (allergic vaginitis). Other factors can change the normal balance of the yeast and bacteria that live in the vagina. These include:  Antibiotic medicines.  Poor hygiene.  Diaphragms, vaginal sponges, spermicides, birth control pills, and intrauterine devices (IUD).  Sexual intercourse.  Infection.  Uncontrolled diabetes.  A weakened immune system. SYMPTOMS  Symptoms can vary depending on the cause of the vaginitis. Common symptoms include:  Abnormal vaginal discharge.  The discharge is white, gray, or yellow with bacterial vaginosis.  The discharge is thick, white, and cheesy with a yeast infection.  The discharge is frothy and yellow or greenish with trichomoniasis.  A bad vaginal odor.  The odor is fishy with bacterial vaginosis.  Vaginal itching, pain, or swelling.  Painful  intercourse.  Pain or burning when urinating. Sometimes, there are no symptoms. TREATMENT  Treatment will vary depending on the type of infection.   Bacterial vaginosis and trichomoniasis are often treated with antibiotic creams or pills.  Yeast infections are often treated with antifungal medicines, such as vaginal creams or suppositories.  Viral vaginitis has no cure, but symptoms can be treated with medicines that relieve discomfort. Your sexual partner should be treated as well.  Atrophic vaginitis may be treated with an estrogen cream, pill, suppository, or vaginal ring. If vaginal dryness occurs, lubricants and moisturizing creams may help. You may be told to avoid scented soaps, sprays, or douches.  Allergic vaginitis treatment involves quitting the use of the product that is causing the problem. Vaginal creams can be used to treat the symptoms. HOME CARE INSTRUCTIONS   Take all medicines as directed by your caregiver.  Keep your genital area clean and dry. Avoid soap and only rinse the area with water.  Avoid douching. It can remove the healthy bacteria in the vagina.  Do not use tampons or have sexual intercourse until your vaginitis has been treated. Use sanitary pads while you have vaginitis.  Wipe from front to back. This avoids the spread of bacteria from the rectum to the vagina.  Let air reach your genital area.  Wear cotton underwear to decrease moisture buildup.  Avoid wearing underwear while you sleep until your vaginitis is gone.  Avoid tight pants and underwear or nylons without a cotton panel.  Take off wet clothing (especially bathing suits) as soon as possible.  Use mild, non-scented products. Avoid using irritants, such as:  Scented feminine sprays.  Fabric softeners.  Scented detergents.  Scented tampons.  Scented soaps or bubble baths.  Practice safe sex and use condoms. Condoms may prevent the spread of trichomoniasis and viral  vaginitis. SEEK MEDICAL CARE IF:   You have abdominal pain.  You have a fever or persistent symptoms for more than 2 3 days.  You have a fever and your symptoms suddenly get worse. Document Released: 05/31/2007 Document Revised: 04/27/2012 Document Reviewed: 01/14/2012 [facility] Patient Information 2014 [place], [place].

## 2013-10-08 NOTE — ED Provider Notes (Signed)
Medical screening examination/treatment/procedure(s) were performed by non-physician practitioner and as supervising physician I was immediately available for consultation/collaboration.  EKG Interpretation   None         Curlie Sittner, MD 10/08/13 2352 

## 2013-10-09 LAB — GC/CHLAMYDIA PROBE AMP
CT Probe RNA: NEGATIVE
GC PROBE AMP APTIMA: NEGATIVE

## 2013-10-13 ENCOUNTER — Ambulatory Visit: Payer: Medicaid Other | Admitting: Obstetrics & Gynecology

## 2013-11-03 ENCOUNTER — Ambulatory Visit (INDEPENDENT_AMBULATORY_CARE_PROVIDER_SITE_OTHER): Payer: Medicaid Other | Admitting: Medical

## 2013-11-03 ENCOUNTER — Encounter: Payer: Self-pay | Admitting: Medical

## 2013-11-03 VITALS — BP 102/68 | HR 64 | Temp 98.1°F | Ht 63.0 in | Wt 145.2 lb

## 2013-11-03 DIAGNOSIS — N390 Urinary tract infection, site not specified: Secondary | ICD-10-CM

## 2013-11-03 DIAGNOSIS — N898 Other specified noninflammatory disorders of vagina: Secondary | ICD-10-CM

## 2013-11-03 DIAGNOSIS — B372 Candidiasis of skin and nail: Secondary | ICD-10-CM

## 2013-11-03 DIAGNOSIS — Z711 Person with feared health complaint in whom no diagnosis is made: Secondary | ICD-10-CM

## 2013-11-03 LAB — POCT URINALYSIS DIP (DEVICE)
Bilirubin Urine: NEGATIVE
GLUCOSE, UA: NEGATIVE mg/dL
Hgb urine dipstick: NEGATIVE
KETONES UR: NEGATIVE mg/dL
LEUKOCYTES UA: NEGATIVE
Nitrite: NEGATIVE
PROTEIN: NEGATIVE mg/dL
SPECIFIC GRAVITY, URINE: 1.025 (ref 1.005–1.030)
Urobilinogen, UA: 0.2 mg/dL (ref 0.0–1.0)
pH: 5.5 (ref 5.0–8.0)

## 2013-11-03 LAB — BASIC METABOLIC PANEL WITH GFR
BUN: 10 mg/dL (ref 6–23)
CHLORIDE: 104 meq/L (ref 96–112)
CO2: 24 mEq/L (ref 19–32)
Calcium: 9.7 mg/dL (ref 8.4–10.5)
Creat: 0.72 mg/dL (ref 0.50–1.10)
Glucose, Bld: 88 mg/dL (ref 70–99)
POTASSIUM: 3.7 meq/L (ref 3.5–5.3)
SODIUM: 138 meq/L (ref 135–145)

## 2013-11-03 LAB — RPR

## 2013-11-03 LAB — HIV ANTIBODY (ROUTINE TESTING W REFLEX): HIV: NONREACTIVE

## 2013-11-03 LAB — HEPATITIS B SURFACE ANTIGEN: HEP B S AG: NEGATIVE

## 2013-11-03 LAB — HEPATITIS C ANTIBODY: HCV Ab: NEGATIVE

## 2013-11-03 MED ORDER — FLUCONAZOLE 150 MG PO TABS: ORAL_TABLET | ORAL | Status: DC

## 2013-11-03 MED ORDER — NYSTATIN 100000 UNIT/GM EX OINT: 1.0000 "application " | TOPICAL_OINTMENT | Freq: Two times a day (BID) | CUTANEOUS | Status: DC

## 2013-11-03 MED ORDER — TRIAMCINOLONE ACETONIDE 0.5 % EX OINT: 1.0000 "application " | TOPICAL_OINTMENT | Freq: Two times a day (BID) | CUTANEOUS | Status: DC

## 2013-11-03 NOTE — Progress Notes (Signed)
Patient ID: Joyce Rice, female   DOB: Jun 12, 1986, 28 y.o.   MRN: [id number]  History:  Joyce Rice is a 28 y.o. [phone] who presents to clinic today for vaginal irritation. The patient states that [facility] and treated for yeast and "bacteria." Notes in Epic suggest that patient was treated for PID and yeast. GC/Chlamydia was negative. The patient states that she has continued to have a small amount of vaginal discharge that is white, creamy and sometimes thick with mild odor. The patient is also having associated itching and endorses redness a few days ago. She states that she has had external burning with urination from the irritation. She also endorses frequent urination, but states that this is a chronic issue. The patient is concerned about HIV and DM because she read that both could be a cause of frequent yeast infections. The patient states a family history of DM. The patient denies history of abnormal pap smear, but unsure when last was performed. Patient has occasional pelvic pain that comes and goes.   The following portions of the patient's history were reviewed and updated as appropriate: allergies, current medications, past family history, past medical history, past social history, past surgical history and problem list.  Review of Systems:  Pertinent items are noted in HPI.  Objective:  Physical Exam BP 102/68  Pulse 64  Temp(Src) 98.1 F (36.7 C)  Ht 5\' 3"  (1.6 m)  Wt 145 lb 3.2 oz (65.862 kg)  BMI 25.73 kg/m2 GENERAL: Well-developed, well-nourished female in no acute distress.  HEENT: Normocephalic, atraumatic.  LUNGS: Normal rate. Clear to auscultation bilaterally.  HEART: Regular rate and rhythm with no adventitious sounds.  ABDOMEN: Soft, nontender, nondistended. No organomegaly. Normal bowel sounds appreciated in all quadrants.  PELVIC: Normal external female genitalia. Vagina is pink and rugated.  Small amount of thin, white discharge and some white  clumpy discharge noted. Normal cervix contour. Uterus is normal in size. No adnexal mass or tenderness.  EXTREMITIES: No cyanosis, clubbing, or edema  Labs and Imaging Results for orders placed in visit on 11/03/13 (from the past 24 hour(s))  POCT URINALYSIS DIP (DEVICE)     Status: None   Collection Time    11/03/13 10:12 AM      Result Value Ref Range   Glucose, UA NEGATIVE  NEGATIVE mg/dL   Bilirubin Urine NEGATIVE  NEGATIVE   Ketones, ur NEGATIVE  NEGATIVE mg/dL   Specific Gravity, Urine 1.025  1.005 - 1.030   Hgb urine dipstick NEGATIVE  NEGATIVE   pH 5.5  5.0 - 8.0   Protein, ur NEGATIVE  NEGATIVE mg/dL   Urobilinogen, UA 0.2  0.0 - 1.0 mg/dL   Nitrite NEGATIVE  NEGATIVE   Leukocytes, UA NEGATIVE  NEGATIVE    Assessment & Plan:  Assessment: Yeast vulvovaginitis Cutaneous yeast  Plans: Wet prep, GC/Chlmaydia, HIV, RPR, Hep B, Hep C and BMP today Rx for Diflucan, Nystatin and Triamcinolone sent to patient's pharmacy Patient will be contacted with any abnormal results [facility] Depo Provera injection Patient advised to schedule for annual exam at her convenience  Freddi StarrJulie N Ethier, PA-C 11/03/2013 10:52 AM

## 2013-11-03 NOTE — Patient Instructions (Signed)
Monilial Vaginitis Vaginitis in a soreness, swelling and redness (inflammation) of the vagina and vulva. Monilial vaginitis is not a sexually transmitted infection. CAUSES  Yeast vaginitis is caused by yeast (candida) that is normally found in your vagina. With a yeast infection, the candida has overgrown in number to a point that upsets the chemical balance. SYMPTOMS   White, thick vaginal discharge.  Swelling, itching, redness and irritation of the vagina and possibly the lips of the vagina (vulva).  Burning or painful urination.  Painful intercourse. DIAGNOSIS  Things that may contribute to monilial vaginitis are:  Postmenopausal and virginal states.  Pregnancy.  Infections.  Being tired, sick or stressed, especially if you had monilial vaginitis in the past.  Diabetes. Good control will help lower the chance.  Birth control pills.  Tight fitting garments.     Using bubble bath, feminine sprays, douches or deodorant tampons.  Taking certain medications that kill germs (antibiotics).  Sporadic recurrence can occur if you become ill. TREATMENT  Your caregiver will give you medication.  There are several kinds of anti monilial vaginal creams and suppositories specific for monilial vaginitis. For recurrent yeast infections, use a suppository or cream in the vagina 2 times a week, or as directed.  Anti-monilial or steroid cream for the itching or irritation of the vulva may also be used. Get your caregiver's permission.  Painting the vagina with methylene blue solution may help if the monilial cream does not work.  Eating yogurt may help prevent monilial vaginitis. HOME CARE INSTRUCTIONS   Finish all medication as prescribed.  Do not have sex until treatment is completed or after your caregiver tells you it is okay.  Take warm sitz baths.  Do not douche.  Do not use tampons, especially scented ones.  Wear cotton underwear.  Avoid tight pants and panty  hose.  Tell your sexual partner that you have a yeast infection. They should go to their caregiver if they have symptoms such as mild rash or itching.  Your sexual partner should be treated as well if your infection is difficult to eliminate.  Practice safer sex. Use condoms.  Some vaginal medications cause latex condoms to fail. Vaginal medications that harm condoms are:  Cleocin cream.  Butoconazole (Femstat).  Terconazole (Terazol) vaginal suppository.  Miconazole (Monistat) (may be purchased over the counter). SEEK MEDICAL CARE IF:   You have a temperature by mouth above 102 F (38.9 C).  The infection is getting worse after 2 days of treatment.  The infection is not getting better after 3 days of treatment.  You develop blisters in or around your vagina.  You develop vaginal bleeding, and it is not your menstrual period.  You have pain when you urinate.  You develop intestinal problems.  You have pain with sexual intercourse. Document Released: 05/13/2005 Document Revised: 10/26/2011 Document Reviewed: 01/25/2009 [facility] Patient Information 2014 [place], [place]. Yeast Infection of the Skin Some yeast on the skin is normal, but sometimes it causes an infection. If you have a yeast infection, it shows up as white or light brown patches on brown skin. You can see it better in the summer on tan skin. It causes light-colored holes in your suntan. It can happen on any area of the body. This cannot be passed from person to person. HOME CARE  Scrub your skin daily with a dandruff shampoo. Your rash may take a couple weeks to get well.  Do not scratch or itch the rash. GET HELP RIGHT AWAY IF:  You get another infection from scratching. The skin may get warm, red, and may ooze fluid.  The infection does not seem to be getting better. MAKE SURE YOU:  Understand these instructions.  Will watch your condition.  Will get help right away if you are not doing well or  get worse. Document Released: 07/16/2008 Document Revised: 10/26/2011 Document Reviewed: 07/16/2008 [facility] Patient Information 2014 [place]   PROBIOTIC Over-The-Counter Once daily

## 2013-11-03 NOTE — Progress Notes (Signed)
Reoccurring burning, itching in vagina area, dx bacteria/yeast  [facility] symptoms continue.  Is concerned about having diabetes or HIV, requesting testing for both. Is overdue for pap smear. Patient having abdominal pain and request urine test for UTI possible.

## 2013-11-04 LAB — WET PREP, GENITAL
Clue Cells Wet Prep HPF POC: NONE SEEN
TRICH WET PREP: NONE SEEN
WBC, Wet Prep HPF POC: NONE SEEN
YEAST WET PREP: NONE SEEN

## 2013-11-04 LAB — GC/CHLAMYDIA PROBE AMP
CT Probe RNA: NEGATIVE
GC Probe RNA: NEGATIVE

## 2013-11-08 ENCOUNTER — Telehealth: Payer: Self-pay | Admitting: *Deleted

## 2013-11-08 NOTE — Telephone Encounter (Addendum)
Pt left message requesting test results. I returned her call and informed her of all test results from her visit on 3/20.  Pt had additional questions about vaginal yeast since she has continued to have sx even though her wet prep was negative. I explained that if the infection was just getting started, it is possible to have a negative test result. I asked when she took the diflucan. She has not yet taken the medication prescribed but plans to obtain it from the pharmacy today.  She reports that this is the third time she has had yeast since January and wants to know why she keeps getting it. I stated that I did not have the answer. I advised her to schedule a follow up appt if she gets sx again within the next 2 months after taking the current Rx for Diflucan.  Pt voiced understanding.

## 2013-11-16 ENCOUNTER — Ambulatory Visit (INDEPENDENT_AMBULATORY_CARE_PROVIDER_SITE_OTHER): Payer: Medicaid Other | Admitting: General Practice

## 2013-11-16 VITALS — BP 105/60 | HR 66 | Temp 98.2°F | Ht 63.0 in | Wt 145.6 lb

## 2013-11-16 DIAGNOSIS — IMO0001 Reserved for inherently not codable concepts without codable children: Secondary | ICD-10-CM

## 2013-11-16 DIAGNOSIS — Z309 Encounter for contraceptive management, unspecified: Secondary | ICD-10-CM

## 2013-11-16 MED ORDER — MEDROXYPROGESTERONE ACETATE 104 MG/0.65ML ~~LOC~~ SUSP
104.0000 mg | Freq: Once | SUBCUTANEOUS | Status: AC
  Administered 2013-11-16: 104 mg via SUBCUTANEOUS

## 2013-11-17 ENCOUNTER — Ambulatory Visit: Payer: Medicaid Other

## 2014-02-07 ENCOUNTER — Ambulatory Visit (INDEPENDENT_AMBULATORY_CARE_PROVIDER_SITE_OTHER): Payer: Medicaid Other

## 2014-02-07 VITALS — BP 93/62 | HR 57 | Temp 99.0°F | Ht 63.0 in | Wt 148.4 lb

## 2014-02-07 DIAGNOSIS — Z3049 Encounter for surveillance of other contraceptives: Secondary | ICD-10-CM

## 2014-02-07 MED ORDER — MEDROXYPROGESTERONE ACETATE 104 MG/0.65ML ~~LOC~~ SUSP
104.0000 mg | Freq: Once | SUBCUTANEOUS | Status: AC
  Administered 2014-02-07: 104 mg via SUBCUTANEOUS

## 2014-03-16 ENCOUNTER — Encounter: Payer: Self-pay | Admitting: Nurse Practitioner

## 2014-03-16 ENCOUNTER — Ambulatory Visit (INDEPENDENT_AMBULATORY_CARE_PROVIDER_SITE_OTHER): Payer: Medicaid Other | Admitting: Nurse Practitioner

## 2014-03-16 ENCOUNTER — Other Ambulatory Visit (HOSPITAL_COMMUNITY)
Admission: RE | Admit: 2014-03-16 | Discharge: 2014-03-16 | Disposition: A | Discharge status: Home / Self Care | Payer: Medicaid Other | Source: Ambulatory Visit | Attending: Nurse Practitioner | Admitting: Nurse Practitioner

## 2014-03-16 VITALS — BP 112/72 | HR 56 | Temp 98.6°F | Ht 63.0 in | Wt 153.1 lb

## 2014-03-16 DIAGNOSIS — Z113 Encounter for screening for infections with a predominantly sexual mode of transmission: Secondary | ICD-10-CM | POA: Diagnosis present

## 2014-03-16 DIAGNOSIS — Z01419 Encounter for gynecological examination (general) (routine) without abnormal findings: Secondary | ICD-10-CM | POA: Diagnosis present

## 2014-03-16 DIAGNOSIS — Z0000 Encounter for general adult medical examination without abnormal findings: Secondary | ICD-10-CM

## 2014-03-16 DIAGNOSIS — Z3049 Encounter for surveillance of other contraceptives: Secondary | ICD-10-CM

## 2014-03-16 LAB — POCT URINALYSIS DIP (DEVICE)
Bilirubin Urine: NEGATIVE
Glucose, UA: NEGATIVE mg/dL
Hgb urine dipstick: NEGATIVE
KETONES UR: NEGATIVE mg/dL
Nitrite: NEGATIVE
Protein, ur: NEGATIVE mg/dL
SPECIFIC GRAVITY, URINE: 1.025 (ref 1.005–1.030)
Urobilinogen, UA: 0.2 mg/dL (ref 0.0–1.0)
pH: 6 (ref 5.0–8.0)

## 2014-03-16 NOTE — Progress Notes (Signed)
History:  Joyce Hinsonis a 28 y.o. [id number] who presents to woman's clinic today for annual exam and birth control. She would like to go back to Depo Provera 150 mg as she believes she is getting weight gain with the 104 mg. Otherwise no abnormal paps. No partner changes. Denies any urinary sx.   The following portions of the patient's history were reviewed and updated as appropriate: allergies, current medications, past family history, past medical history, past social history, past surgical history and problem list.  Review of Systems:  Pertinent items are noted in HPI.  Objective:  Physical Exam BP 112/72  Pulse 56  Temp(Src) 98.6 F (37 C)  Ht 5\' 3"  (1.6 m)  Wt 153 lb 1.6 oz (69.446 kg)  BMI 27.13 kg/m2 GENERAL: Well-developed, well-nourished female in no acute distress.  HEENT: Normocephalic, atraumatic.  NECK: Supple. Normal thyroid.  LUNGS: Normal rate. Clear to auscultation bilaterally.  HEART: Regular rate and rhythm with no adventitious sounds.  BREASTS: Symmetric in size. No masses, skin changes, nipple drainage, or lymphadenopathy. ABDOMEN: Soft, nontender, nondistended. No organomegaly. Normal bowel sounds appreciated in all quadrants.  PELVIC: Normal external female genitalia. Vagina is pink and rugated.  Normal discharge. Normal cervix contour. Pap smear obtained. Uterus is normal in size. No adnexal mass or tenderness.  EXTREMITIES: No cyanosis, clubbing, or edema, 2+ distal pulses.   Labs and Imaging Results for orders placed in visit on 03/16/14 (from the past 24 hour(s))  POCT URINALYSIS DIP (DEVICE)     Status: Abnormal   Collection Time    03/16/14 10:10 AM      Result Value Ref Range   Glucose, UA NEGATIVE  NEGATIVE mg/dL   Bilirubin Urine NEGATIVE  NEGATIVE   Ketones, ur NEGATIVE  NEGATIVE mg/dL   Specific Gravity, Urine 1.025  1.005 - 1.030   Hgb urine dipstick NEGATIVE  NEGATIVE   pH 6.0  5.0 - 8.0   Protein, ur NEGATIVE  NEGATIVE mg/dL   Urobilinogen, UA 0.2  0.0 - 1.0 mg/dL   Nitrite NEGATIVE  NEGATIVE   Leukocytes, UA TRACE (*) NEGATIVE     Assessment & Plan:  Assessment: Well Woman Exam Contraception  Plans: Will advise of pap/ GC/Chlamydai results Depo Provera 150 mg IM 12 weeks x 1 year Follow up with her schedule for depo  Joyce PhenixLinda M Nickie Warwick, NP 03/16/2014 11:49 AM

## 2014-03-16 NOTE — Patient Instructions (Signed)
Contraception Choices Contraception (birth control) is the use of any methods or devices to prevent pregnancy. Below are some methods to help avoid pregnancy. HORMONAL METHODS   Contraceptive implant. This is a thin, plastic tube containing progesterone hormone. It does not contain estrogen hormone. Your health care provider inserts the tube in the inner part of the upper arm. The tube can remain in place for up to 3 years. After 3 years, the implant must be removed. The implant prevents the ovaries from releasing an egg (ovulation), thickens the cervical mucus to prevent sperm from entering the uterus, and thins the lining of the inside of the uterus.  Progesterone-only injections. These injections are given every 3 months by your health care provider to prevent pregnancy. This synthetic progesterone hormone stops the ovaries from releasing eggs. It also thickens cervical mucus and changes the uterine lining. This makes it harder for sperm to survive in the uterus.  Birth control pills. These pills contain estrogen and progesterone hormone. They work by preventing the ovaries from releasing eggs (ovulation). They also cause the cervical mucus to thicken, preventing the sperm from entering the uterus. Birth control pills are prescribed by a health care provider.Birth control pills can also be used to treat heavy periods.  Minipill. This type of birth control pill contains only the progesterone hormone. They are taken every day of each month and must be prescribed by your health care provider.  Birth control patch. The patch contains hormones similar to those in birth control pills. It must be changed once a week and is prescribed by a health care provider.  Vaginal ring. The ring contains hormones similar to those in birth control pills. It is left in the vagina for 3 weeks, removed for 1 week, and then a new one is put back in place. The patient must be comfortable inserting and removing the ring  from the vagina.A health care provider's prescription is necessary.  Emergency contraception. Emergency contraceptives prevent pregnancy after unprotected sexual intercourse. This pill can be taken right after sex or up to 5 days after unprotected sex. It is most effective the sooner you take the pills after having sexual intercourse. Most emergency contraceptive pills are available without a prescription. Check with your pharmacist. Do not use emergency contraception as your only form of birth control. BARRIER METHODS   Female condom. This is a thin sheath (latex or rubber) that is worn over the penis during sexual intercourse. It can be used with spermicide to increase effectiveness.  Female condom. This is a soft, loose-fitting sheath that is put into the vagina before sexual intercourse.  Diaphragm. This is a soft, latex, dome-shaped barrier that must be fitted by a health care provider. It is inserted into the vagina, along with a spermicidal jelly. It is inserted before intercourse. The diaphragm should be left in the vagina for 6 to 8 hours after intercourse.  Cervical cap. This is a round, soft, latex or plastic cup that fits over the cervix and must be fitted by a health care provider. The cap can be left in place for up to 48 hours after intercourse.  Sponge. This is a soft, circular piece of polyurethane foam. The sponge has spermicide in it. It is inserted into the vagina after wetting it and before sexual intercourse.  Spermicides. These are chemicals that kill or block sperm from entering the cervix and uterus. They come in the form of creams, jellies, suppositories, foam, or tablets. They do not require a   prescription. They are inserted into the vagina with an applicator before having sexual intercourse. The process must be repeated every time you have sexual intercourse. INTRAUTERINE CONTRACEPTION  Intrauterine device (IUD). This is a T-shaped device that is put in a woman's uterus  during a menstrual period to prevent pregnancy. There are 2 types:  Copper IUD. This type of IUD is wrapped in copper wire and is placed inside the uterus. Copper makes the uterus and fallopian tubes produce a fluid that kills sperm. It can stay in place for 10 years.  Hormone IUD. This type of IUD contains the hormone progestin (synthetic progesterone). The hormone thickens the cervical mucus and prevents sperm from entering the uterus, and it also thins the uterine lining to prevent implantation of a fertilized egg. The hormone can weaken or kill the sperm that get into the uterus. It can stay in place for 3-5 years, depending on which type of IUD is used. PERMANENT METHODS OF CONTRACEPTION  Female tubal ligation. This is when the woman's fallopian tubes are surgically sealed, tied, or blocked to prevent the egg from traveling to the uterus.  Hysteroscopic sterilization. This involves placing a small coil or insert into each fallopian tube. Your doctor uses a technique called hysteroscopy to do the procedure. The device causes scar tissue to form. This results in permanent blockage of the fallopian tubes, so the sperm cannot fertilize the egg. It takes about 3 months after the procedure for the tubes to become blocked. You must use another form of birth control for these 3 months.  Female sterilization. This is when the female has the tubes that carry sperm tied off (vasectomy).This blocks sperm from entering the vagina during sexual intercourse. After the procedure, the man can still ejaculate fluid (semen). NATURAL PLANNING METHODS  Natural family planning. This is not having sexual intercourse or using a barrier method (condom, diaphragm, cervical cap) on days the woman could become pregnant.  Calendar method. This is keeping track of the length of each menstrual cycle and identifying when you are fertile.  Ovulation method. This is avoiding sexual intercourse during ovulation.  Symptothermal  method. This is avoiding sexual intercourse during ovulation, using a thermometer and ovulation symptoms.  Post-ovulation method. This is timing sexual intercourse after you have ovulated. Regardless of which type or method of contraception you choose, it is important that you use condoms to protect against the transmission of sexually transmitted infections (STIs). Talk with your health care provider about which form of contraception is most appropriate for you. Document Released: 08/03/2005 Document Revised: 08/08/2013 Document Reviewed: 01/26/2013 [employer] Patient Information 2015 [employer]. This information is not intended to replace advice given to you by your health care provider. Make sure you discuss any questions you have with your health care provider.  

## 2014-03-16 NOTE — Addendum Note (Signed)
Addended by: Candelaria StagersHAIZLIP, Daison Braxton E on: 03/16/2014 12:15 PM   Modules accepted: Orders

## 2014-03-20 LAB — CYTOLOGY - PAP

## 2014-03-26 ENCOUNTER — Telehealth: Payer: Self-pay | Admitting: General Practice

## 2014-03-26 NOTE — Telephone Encounter (Signed)
Patient called and left message stating she would like her test results. Called patient, no answer- left message that we are trying to return your phone call, please call us back at the clinics

## 2014-03-27 NOTE — Telephone Encounter (Signed)
Contacted patient, results for pap smear given. Pt verbalizes understanding, no further questions.

## 2014-05-02 ENCOUNTER — Ambulatory Visit (INDEPENDENT_AMBULATORY_CARE_PROVIDER_SITE_OTHER): Payer: Medicaid Other

## 2014-05-02 VITALS — BP 104/55 | HR 58 | Temp 98.0°F | Ht 63.0 in | Wt 153.1 lb

## 2014-05-02 DIAGNOSIS — Z3042 Encounter for surveillance of injectable contraceptive: Secondary | ICD-10-CM

## 2014-05-02 DIAGNOSIS — Z3049 Encounter for surveillance of other contraceptives: Secondary | ICD-10-CM

## 2014-05-02 MED ORDER — MEDROXYPROGESTERONE ACETATE 150 MG/ML IM SUSP
150.0000 mg | Freq: Once | INTRAMUSCULAR | Status: AC
  Administered 2014-05-02: 150 mg via INTRAMUSCULAR

## 2014-05-02 NOTE — Progress Notes (Signed)
Patient here today for depo provera injection. Per [facility], increase dose from  to  injection.  Depo provera administered into RUO quadrant of right buttocks (per request of patient). Tolerated well. Patient to return between 07/18/14-08/01/14. Denies any questions, concerns or problems.

## 2014-05-23 ENCOUNTER — Encounter: Payer: Self-pay | Admitting: Obstetrics & Gynecology

## 2014-05-23 ENCOUNTER — Ambulatory Visit (INDEPENDENT_AMBULATORY_CARE_PROVIDER_SITE_OTHER): Payer: Self-pay | Admitting: Obstetrics & Gynecology

## 2014-05-23 VITALS — BP 110/69 | HR 71 | Temp 99.1°F | Ht 63.0 in | Wt 152.4 lb

## 2014-05-23 DIAGNOSIS — N63 Unspecified lump in breast: Secondary | ICD-10-CM

## 2014-05-23 DIAGNOSIS — N6322 Unspecified lump in the left breast, upper inner quadrant: Secondary | ICD-10-CM

## 2014-05-23 NOTE — Patient Instructions (Signed)
HPV Vaccine Gardasil (Human Papillomavirus): What You Need to Know 1. What is HPV? Genital human papillomavirus (HPV) is the most common sexually transmitted virus in the [place]. More than half of sexually active men and women are infected with HPV at some time in their lives. About 20 million Americans are currently infected, and about 6 million more get infected each year. HPV is usually spread through sexual contact. Most HPV infections don't cause any symptoms, and go away on their own. But HPV can cause cervical cancer in women. Cervical cancer is the 2nd leading cause of cancer deaths among women around the world. In the [place], about 12,000 women get cervical cancer every year and about 4,000 are expected to die from it. HPV is also associated with several less common cancers, such as vaginal and vulvar cancers in women, and anal and oropharyngeal (back of the throat, including base of tongue and tonsils) cancers in both men and women. HPV can also cause genital warts and warts in the throat. There is no cure for HPV infection, but some of the problems it causes can be treated. 2. HPV vaccine: Why get vaccinated? The HPV vaccine you are getting is one of two vaccines that can be given to prevent HPV. It may be given to both males and females.  This vaccine can prevent most cases of cervical cancer in females, if it is given before exposure to the virus. In addition, it can prevent vaginal and vulvar cancer in females, and genital warts and anal cancer in both males and females. Protection from HPV vaccine is expected to be long-lasting. But vaccination is not a substitute for cervical cancer screening. Women should still get regular Pap tests. 3. Who should get this HPV vaccine and when? HPV vaccine is given as a 3-dose series  1st Dose: Now  2nd Dose: 1 to 2 months after Dose 1  3rd Dose: 6 months after Dose 1 Additional (booster) doses are not recommended. Routine  vaccination  This HPV vaccine is recommended for girls and boys 11 or 26 years of age. It may be given starting at age 9. Why is HPV vaccine recommended at 11 or 26 years of age?  HPV infection is easily acquired, even with only one sex partner. That is why it is important to get HPV vaccine before any sexual contact takes place. Also, response to the vaccine is better at this age than at older ages. Catch-up vaccination This vaccine is recommended for the following people who have not completed the 3-dose series:   Females 13 through 26 years of age.  Males 13 through 26 years of age. This vaccine may be given to men 22 through 26 years of age who have not completed the 3-dose series. It is recommended for men through age 26 who have sex with men or whose immune system is weakened because of HIV infection, other illness, or medications.  HPV vaccine may be given at the same time as other vaccines. 4. Some people should not get HPV vaccine or should wait.  Anyone who has ever had a life-threatening allergic reaction to any component of HPV vaccine, or to a previous dose of HPV vaccine, should not get the vaccine. Tell your doctor if the person getting vaccinated has any severe allergies, including an allergy to yeast.  HPV vaccine is not recommended for pregnant women. However, receiving HPV vaccine when pregnant is not a reason to consider terminating the pregnancy. Women who are breast   feeding may get the vaccine.  People who are mildly ill when a dose of HPV is planned can still be vaccinated. People with a moderate or severe illness should wait until they are better. 5. What are the risks from this vaccine? This HPV vaccine has been used in the [place] and around the world for about six years and has been very safe. However, any medicine could possibly cause a serious problem, such as a severe allergic reaction. The risk of any vaccine causing a serious injury, or death, is extremely  small. Life-threatening allergic reactions from vaccines are very rare. If they do occur, it would be within a few minutes to a few hours after the vaccination. Several mild to moderate problems are known to occur with this HPV vaccine. These do not last long and go away on their own.  Reactions in the arm where the shot was given:  Pain (about 8 people in 10)  Redness or swelling (about 1 person in 4)  Fever:  Mild (100 F) (about 1 person in 10)  Moderate (102 F) (about 1 person in 65)  Other problems:  Headache (about 1 person in 3)  Fainting: Brief fainting spells and related symptoms (such as jerking movements) can happen after any medical procedure, including vaccination. Sitting or lying down for about 15 minutes after a vaccination can help prevent fainting and injuries caused by falls. Tell your doctor if the patient feels dizzy or light-headed, or has vision changes or ringing in the ears.  Like all vaccines, HPV vaccines will continue to be monitored for unusual or severe problems. 6. What if there is a serious reaction? What should I look for?  Look for anything that concerns you, such as signs of a severe allergic reaction, very high fever, or behavior changes. Signs of a severe allergic reaction can include hives, swelling of the face and throat, difficulty breathing, a fast heartbeat, dizziness, and weakness. These would start a few minutes to a few hours after the vaccination.  What should I do?  If you think it is a severe allergic reaction or other emergency that can't wait, call 9-1-1 or get the person to the nearest hospital. Otherwise, call your doctor.  Afterward, the reaction should be reported to the Vaccine Adverse Event Reporting System (VAERS). Your doctor might file this report, or you can do it yourself through the VAERS web site at www.vaers.hhs.gov, or by calling 1-800-822-7967. VAERS is only for reporting reactions. They do not give medical  advice. 7. The National Vaccine Injury Compensation Program  The National Vaccine Injury Compensation Program (VICP) is a federal program that was created to compensate people who may have been injured by certain vaccines.  Persons who believe they may have been injured by a vaccine can learn about the program and about filing a claim by calling 1-800-338-2382 or visiting the VICP website at www.hrsa.gov/vaccinecompensation. 8. How can I learn more?  Ask your doctor.  Call your local or state health department.  Contact the Centers for Disease Control and Prevention (CDC):  Call 1-800-232-4636 (1-800-CDC-INFO)  or  Visit CDC's website at www.cdc.gov/vaccines CDC Human Papillomavirus (HPV) Gardasil (Interim) 01/01/12 Document Released: 05/31/2006 Document Revised: 12/18/2013 Document Reviewed: 09/14/2013 ExitCare Patient Information 2015 ExitCare, LLC. This information is not intended to replace advice given to you by your health care provider. Make sure you discuss any questions you have with your health care provider.  

## 2014-05-23 NOTE — Progress Notes (Signed)
   CLINIC ENCOUNTER NOTE  History:  28 y.o. [id number] here today for left breast lump noticed a few days ago that is concerning to her.  She applied warm compresses and it reduced in size but she is very concerned. No nipple drainage, skin changes or other concerns. No FH of breast or ovarian cancer.   The following portions of the patient's history were reviewed and updated as appropriate: allergies, current medications, past family history, past medical history, past social history, past surgical history and problem list.  Normal pap in 03/16/14.   Review of Systems:  Pertinent items are noted in HPI.  Objective:  BP 110/69  Pulse 71  Temp(Src) 99.1 F (37.3 C) (Oral)  Ht 5\' 3"  (1.6 m)  Wt 152 lb 6.4 oz (69.128 kg)  BMI 27.00 kg/m2 GENERAL: Well-developed, well-nourished female in no acute distress.  BREASTS: Symmetric in size. 5 mm mobile tenderness mass palpated in left inner, upper quadrant (10 o'clock). No other masses, skin changes, nipple drainage, or lymphadenopathy bilaterally.  Assessment & Plan:  Likely benign but will obtain imaging; will follow up results and manage accordingly. Routine preventative health maintenance measures emphasized.   Jaynie CollinsUGONNA  Maciah Schweigert, MD, FACOG Attending Obstetrician & Gynecologist Center for [facility], [facility]

## 2014-05-29 ENCOUNTER — Ambulatory Visit
Admission: RE | Admit: 2014-05-29 | Discharge: 2014-05-29 | Disposition: A | Discharge status: Home / Self Care | Payer: Medicaid Other | Source: Ambulatory Visit | Attending: Obstetrics & Gynecology | Admitting: Obstetrics & Gynecology

## 2014-05-29 DIAGNOSIS — N6322 Unspecified lump in the left breast, upper inner quadrant: Secondary | ICD-10-CM

## 2014-06-18 ENCOUNTER — Encounter: Payer: Self-pay | Admitting: Obstetrics & Gynecology

## 2014-06-25 ENCOUNTER — Encounter (HOSPITAL_COMMUNITY): Payer: Self-pay | Admitting: Emergency Medicine

## 2014-06-25 ENCOUNTER — Emergency Department (HOSPITAL_COMMUNITY): Payer: Medicaid Other

## 2014-06-25 ENCOUNTER — Emergency Department (HOSPITAL_COMMUNITY)
Admission: EM | Admit: 2014-06-25 | Discharge: 2014-06-25 | Disposition: A | Discharge status: Home / Self Care | Payer: Medicaid Other | Attending: Emergency Medicine | Admitting: Emergency Medicine

## 2014-06-25 DIAGNOSIS — J029 Acute pharyngitis, unspecified: Secondary | ICD-10-CM

## 2014-06-25 DIAGNOSIS — Z8742 Personal history of other diseases of the female genital tract: Secondary | ICD-10-CM | POA: Insufficient documentation

## 2014-06-25 DIAGNOSIS — Z8619 Personal history of other infectious and parasitic diseases: Secondary | ICD-10-CM | POA: Insufficient documentation

## 2014-06-25 DIAGNOSIS — R1310 Dysphagia, unspecified: Secondary | ICD-10-CM | POA: Insufficient documentation

## 2014-06-25 DIAGNOSIS — Z79899 Other long term (current) drug therapy: Secondary | ICD-10-CM | POA: Insufficient documentation

## 2014-06-25 DIAGNOSIS — R05 Cough: Secondary | ICD-10-CM | POA: Insufficient documentation

## 2014-06-25 LAB — RAPID STREP SCREEN (MED CTR MEBANE ONLY): Streptococcus, Group A Screen (Direct): NEGATIVE

## 2014-06-25 MED ORDER — DEXAMETHASONE 4 MG PO TABS
10.0000 mg | ORAL_TABLET | Freq: Once | ORAL | Status: AC
  Administered 2014-06-25: 09:00:00 10 mg via ORAL
  Filled 2014-06-25: qty 1

## 2014-06-25 MED ORDER — HYDROCODONE-ACETAMINOPHEN 7.5-325 MG/15ML PO SOLN: 15.0000 mL | ORAL | Status: DC | PRN

## 2014-06-25 MED ORDER — HYDROCODONE-ACETAMINOPHEN 7.5-325 MG/15ML PO SOLN
10.0000 mL | Freq: Once | ORAL | Status: AC
  Administered 2014-06-25: 10 mL via ORAL
  Filled 2014-06-25: qty 15

## 2014-06-25 MED ORDER — PENICILLIN G BENZATHINE 1200000 UNIT/2ML IM SUSP
1.2000 10*6.[IU] | Freq: Once | INTRAMUSCULAR | Status: AC
  Administered 2014-06-25: 1.2 10*6.[IU] via INTRAMUSCULAR
  Filled 2014-06-25: qty 2

## 2014-06-25 NOTE — ED Notes (Signed)
Pt c/o sore throat that started Friday, pt states it got worse overnight. Pt states it is painful to swallow.

## 2014-06-25 NOTE — Discharge Instructions (Signed)
Return to the emergency room with worsening of symptoms, new symptoms or with symptoms that are concerning , especially fevers, stiff neck, worsening headache, nausea/vomiting, visual changes or slurred speech, chest pain, shortness of breath, cough with thick colored mucous or blood Drink plenty of fluids with electrolytes especially Gatorade. OTC cold medications such as mucinex, nyquil, dayquil are recommended. Chloraseptic for sore throat. hycet for severe cough and throat pain. Do not operate machinery, drive or drink alcohol while taking narcotics including cough syrup or muscle relaxers. Follow up with the wellness center in 2 days. Call to make appointment. Number above.   Dysphagia Swallowing problems (dysphagia) occur when solids and liquids seem to stick in your throat on the way down to your stomach, or the food takes longer to get to the stomach. Other symptoms include regurgitating food, noises coming from the throat, chest discomfort with swallowing, and a feeling of fullness or the feeling of something being stuck in your throat when swallowing. When blockage in your throat is complete, it may be associated with drooling. CAUSES  Problems with swallowing may occur because of problems with the muscles. The food cannot be propelled in the usual manner into your stomach. You may have ulcers, scar tissue, or inflammation in the tube down which food travels from your mouth to your stomach (esophagus), which blocks food from passing normally into the stomach. Causes of inflammation include:  Acid reflux from your stomach into your esophagus.  Infection.  Radiation treatment for cancer.  Medicines taken without enough fluids to wash them down into your stomach. You may have nerve problems that prevent signals from being sent to the muscles of your esophagus to contract and move your food down to your stomach. Globus pharyngeus is a relatively common problem in which there is a sense of an  obstruction or difficulty in swallowing, without any physical abnormalities of the swallowing passages being found. This problem usually improves over time with reassurance and testing to rule out other causes. DIAGNOSIS Dysphagia can be diagnosed and its cause can be determined by tests in which you swallow a white substance that helps illuminate the inside of your throat (contrast medium) while X-rays are taken. Sometimes a flexible telescope that is inserted down your throat (endoscopy) to look at your esophagus and stomach is used. TREATMENT   If the dysphagia is caused by acid reflux or infection, medicines may be used.  If the dysphagia is caused by problems with your swallowing muscles, swallowing therapy may be used to help you strengthen your swallowing muscles.  If the dysphagia is caused by a blockage or mass, procedures to remove the blockage may be done. HOME CARE INSTRUCTIONS  Try to eat soft food that is easier to swallow and check your weight on a daily basis to be sure that it is not decreasing.  Be sure to drink liquids when sitting upright (not lying down). SEEK MEDICAL CARE IF:  You are losing weight because you are unable to swallow.  You are coughing when you drink liquids (aspiration).  You are coughing up partially digested food. SEEK IMMEDIATE MEDICAL CARE IF:  You are unable to swallow your own saliva .  You are having shortness of breath or a fever, or both.  You have a hoarse voice along with difficulty swallowing. MAKE SURE YOU:  Understand these instructions.  Will watch your condition.  Will get help right away if you are not doing well or get worse. Document Released: 07/31/2000 Document Revised:  12/18/2013 Document Reviewed: 01/20/2013 ExitCare Patient Information 2015 [place]. This information is not intended to replace advice given to you by your health care provider. Make sure you discuss any questions you have with your health care  provider.

## 2014-06-25 NOTE — ED Provider Notes (Signed)
CSN: [id number]     Arrival date & time 06/25/14  [id number] History   First MD Initiated Contact with Patient 06/25/14 0825     Chief Complaint  Patient presents with  . Sore Throat     (Consider location/radiation/quality/duration/timing/severity/associated sxs/prior Treatment) HPI  Joyce Rice is a 28 y.o. female presenting with sore throat that started 3 days ago with acute worsening last night. Patient endorses dysphasia. She is able to tolerate oral fluids. Patient notes tactile fevers, chills. Also soreness and pain in throat. No neck pain. Patient endorses cough with intermittent thin mucous. Patient denies ear pain. Patient has toddler at Home who Was Also Coughing. Mild rhinorrhea and sinus congestion. No history of asthma, COPD or smoking history. No drooling, chest pain, shortness of breath, nausea, vomiting, diarrhea, abdominal pain, back pain.   Past Medical History  Diagnosis Date  . Bartholin cyst   . Irregular periods/menstrual cycles   . Chlamydia    Past Surgical History  Procedure Laterality Date  . Dilation and curettage of uterus     No family history on file. History  Substance Use Topics  . Smoking status: Never Smoker   . Smokeless tobacco: Never Used  . Alcohol Use: No   OB History    Gravida Para Term Preterm AB TAB SAB Ectopic Multiple Living   4 1 1  0 3 3 0 0 0 1     Review of Systems  Constitutional: Positive for fever. Negative for chills.  HENT: Positive for congestion and rhinorrhea.   Eyes: Negative for visual disturbance.  Respiratory: Positive for cough. Negative for shortness of breath.   Cardiovascular: Negative for chest pain and palpitations.  Gastrointestinal: Negative for nausea, vomiting and diarrhea.  Musculoskeletal: Negative for back pain and gait problem.  Skin: Negative for rash.  Neurological: Negative for weakness and headaches.      Allergies  Review of patient's allergies indicates no known allergies.  Home  Medications   Prior to Admission medications   Medication Sig Start Date End Date Taking? Authorizing Provider  medroxyPROGESTERone (DEPO-PROVERA) 150 MG/ML injection Inject 150 mg into the muscle every 3 (three) months.   Yes Historical Provider, MD  HYDROcodone-acetaminophen (HYCET) 7.5-325 mg/15 ml solution Take 15 mLs by mouth every 4 (four) hours as needed for moderate pain or severe pain. 06/25/14   Louann SjogrenVictoria L Lilliane Sposito, PA-C  Multiple Vitamins-Minerals (MULTIVITAMIN WITH MINERALS) tablet Take 1 tablet by mouth daily.    Historical Provider, MD   BP 103/71 mmHg  Pulse 57  Temp(Src) 98.8 F (37.1 C) (Oral)  Resp 20  SpO2 100% Physical Exam  Constitutional: She appears well-developed and well-nourished. No distress.  HENT:  Head: Normocephalic and atraumatic.  Right Ear: Tympanic membrane and external ear normal.  Left Ear: Tympanic membrane and external ear normal.  Nose: Right sinus exhibits no maxillary sinus tenderness and no frontal sinus tenderness. Left sinus exhibits no maxillary sinus tenderness and no frontal sinus tenderness.  Mouth/Throat: Mucous membranes are normal. Oropharyngeal exudate, posterior oropharyngeal edema and posterior oropharyngeal erythema present.  Eyes: Conjunctivae and EOM are normal. Right eye exhibits no discharge. Left eye exhibits no discharge.  Neck: Normal range of motion. Neck supple.  Cardiovascular: Normal rate, regular rhythm and normal heart sounds.   Pulmonary/Chest: Effort normal and breath sounds normal. No respiratory distress. She has no wheezes. She has no rales.  Abdominal: Soft. Bowel sounds are normal. She exhibits no distension. There is no tenderness.  Lymphadenopathy:    She  has cervical adenopathy.  Neurological: She is alert.  Skin: Skin is warm and dry. She is not diaphoretic.  Nursing note and vitals reviewed.   ED Course  Procedures (including critical care time) Labs Review Labs Reviewed  RAPID STREP SCREEN  CULTURE,  GROUP A STREP    Imaging Review Dg Neck Soft Tissue  06/25/2014   CLINICAL DATA:  Initial encounter for 2 day history of sore throat.  EXAM: NECK SOFT TISSUES - 1+ VIEW  COMPARISON:  None.  FINDINGS: Two views study using soft tissue technique shows no prevertebral soft tissue swelling. No evidence for gas within the prevertebral soft tissues. Epiglottis and aryepiglottic folds are normal. Visualized bony structures are unremarkable.  IMPRESSION: Normal exam.   Electronically Signed   By: Kennith CenterEric  Mansell M.D.   On: 06/25/2014 12:13     EKG Interpretation None      MDM   Final diagnoses:  Dysphagia  Pharyngitis   Pt febrile with tonsillar small amt of exudate, cervical lymphadenopathy, & dysphagia; diagnosis of pharyngitis. Xray neck soft tissue without abnormalities. Treated in the ED with steroids, Pain medication and PCN IM.  Pt appears mildly dehydrated, discussed importance of water rehydration. Presentation non concerning for PTA or infxn spread to soft tissue. No trismus or uvula deviation. Specific return precautions discussed. Pt able to drink water in ED without difficulty with intact air way. Follow up with the wellness center in 2 days. Script for hycet. Driving and sedation precautions provided.  Discussed return precautions with patient. Discussed all results and patient verbalizes understanding and agrees with plan.  This is a shared patient. This patient was discussed with the physician, Dr. Silverio LayYao who saw and evaluated the patient.      Louann SjogrenVictoria L Jeanna Giuffre, PA-C 06/26/14 1020  Richardean Canalavid H Yao, MD 06/26/14 [phone]

## 2014-06-27 LAB — CULTURE, GROUP A STREP

## 2014-07-25 ENCOUNTER — Ambulatory Visit: Payer: Medicaid Other

## 2014-08-06 ENCOUNTER — Encounter (HOSPITAL_COMMUNITY): Payer: Self-pay | Admitting: Emergency Medicine

## 2014-08-06 ENCOUNTER — Emergency Department (HOSPITAL_COMMUNITY)
Admission: EM | Admit: 2014-08-06 | Discharge: 2014-08-06 | Disposition: A | Discharge status: Home / Self Care | Payer: Medicaid Other | Attending: Emergency Medicine | Admitting: Emergency Medicine

## 2014-08-06 DIAGNOSIS — R1084 Generalized abdominal pain: Secondary | ICD-10-CM | POA: Insufficient documentation

## 2014-08-06 DIAGNOSIS — Z3202 Encounter for pregnancy test, result negative: Secondary | ICD-10-CM | POA: Insufficient documentation

## 2014-08-06 DIAGNOSIS — R112 Nausea with vomiting, unspecified: Secondary | ICD-10-CM | POA: Insufficient documentation

## 2014-08-06 DIAGNOSIS — R630 Anorexia: Secondary | ICD-10-CM | POA: Insufficient documentation

## 2014-08-06 DIAGNOSIS — Z8619 Personal history of other infectious and parasitic diseases: Secondary | ICD-10-CM | POA: Insufficient documentation

## 2014-08-06 DIAGNOSIS — Z8742 Personal history of other diseases of the female genital tract: Secondary | ICD-10-CM | POA: Insufficient documentation

## 2014-08-06 DIAGNOSIS — R197 Diarrhea, unspecified: Secondary | ICD-10-CM | POA: Insufficient documentation

## 2014-08-06 LAB — COMPREHENSIVE METABOLIC PANEL
ALBUMIN: 4.2 g/dL (ref 3.5–5.2)
ALK PHOS: 67 U/L (ref 39–117)
ALT: 16 U/L (ref 0–35)
ANION GAP: 14 (ref 5–15)
AST: 20 U/L (ref 0–37)
BILIRUBIN TOTAL: 0.4 mg/dL (ref 0.3–1.2)
BUN: 13 mg/dL (ref 6–23)
CHLORIDE: 101 meq/L (ref 96–112)
CO2: 21 mEq/L (ref 19–32)
Calcium: 9.6 mg/dL (ref 8.4–10.5)
Creatinine, Ser: 0.75 mg/dL (ref 0.50–1.10)
GLUCOSE: 104 mg/dL — AB (ref 70–99)
Potassium: 4.1 mEq/L (ref 3.7–5.3)
Sodium: 136 mEq/L — ABNORMAL LOW (ref 137–147)
Total Protein: 7.9 g/dL (ref 6.0–8.3)

## 2014-08-06 LAB — CBC WITH DIFFERENTIAL/PLATELET
BASOS PCT: 0 % (ref 0–1)
Basophils Absolute: 0 10*3/uL (ref 0.0–0.1)
Eosinophils Absolute: 0.2 10*3/uL (ref 0.0–0.7)
Eosinophils Relative: 3 % (ref 0–5)
HEMATOCRIT: 40.8 % (ref 36.0–46.0)
Hemoglobin: 13.4 g/dL (ref 12.0–15.0)
Lymphocytes Relative: 40 % (ref 12–46)
Lymphs Abs: 2.1 10*3/uL (ref 0.7–4.0)
MCH: 29.7 pg (ref 26.0–34.0)
MCHC: 32.8 g/dL (ref 30.0–36.0)
MCV: 90.5 fL (ref 78.0–100.0)
MONOS PCT: 6 % (ref 3–12)
Monocytes Absolute: 0.3 10*3/uL (ref 0.1–1.0)
NEUTROS ABS: 2.6 10*3/uL (ref 1.7–7.7)
Neutrophils Relative %: 51 % (ref 43–77)
Platelets: 134 10*3/uL — ABNORMAL LOW (ref 150–400)
RBC: 4.51 MIL/uL (ref 3.87–5.11)
RDW: 12.1 % (ref 11.5–15.5)
WBC: 5.2 10*3/uL (ref 4.0–10.5)

## 2014-08-06 LAB — URINALYSIS, ROUTINE W REFLEX MICROSCOPIC
BILIRUBIN URINE: NEGATIVE
GLUCOSE, UA: NEGATIVE mg/dL
Hgb urine dipstick: NEGATIVE
KETONES UR: NEGATIVE mg/dL
LEUKOCYTES UA: NEGATIVE
Nitrite: NEGATIVE
Protein, ur: NEGATIVE mg/dL
Specific Gravity, Urine: 1.027 (ref 1.005–1.030)
Urobilinogen, UA: 0.2 mg/dL (ref 0.0–1.0)
pH: 5 (ref 5.0–8.0)

## 2014-08-06 LAB — PREGNANCY, URINE: Preg Test, Ur: NEGATIVE

## 2014-08-06 LAB — LIPASE, BLOOD: Lipase: 21 U/L (ref 11–59)

## 2014-08-06 MED ORDER — ONDANSETRON HCL 4 MG/2ML IJ SOLN
4.0000 mg | Freq: Once | INTRAMUSCULAR | Status: AC
  Administered 2014-08-06: 4 mg via INTRAVENOUS
  Filled 2014-08-06: qty 2

## 2014-08-06 MED ORDER — SODIUM CHLORIDE 0.9 % IV BOLUS (SEPSIS)
1000.0000 mL | Freq: Once | INTRAVENOUS | Status: AC
  Administered 2014-08-06: 1000 mL via INTRAVENOUS

## 2014-08-06 MED ORDER — ONDANSETRON HCL 4 MG PO TABS: 4.0000 mg | ORAL_TABLET | Freq: Four times a day (QID) | ORAL | Status: DC

## 2014-08-06 NOTE — ED Notes (Signed)
Pt c/o gen abd pain, NVD since 0230 this morning.

## 2014-08-06 NOTE — Discharge Instructions (Signed)

## 2014-08-06 NOTE — ED Provider Notes (Signed)
CSN: [id number]     Arrival date & time 08/06/14  [id number] History   First MD Initiated Contact with Patient 08/06/14 [phone]     Chief Complaint  Patient presents with  . Abdominal Pain  . Nausea  . Emesis  . Diarrhea     (Consider location/radiation/quality/duration/timing/severity/associated sxs/prior Treatment) HPI Comments: Patient awoke with abdominal cramping, diarrhea, nausea and vomiting at 2:30 this morning. States she has vomited 3 times and 3 episodes of nonbloody diarrhea. This started after she ate [employer] last night. She is not sure if anyone else has been sick. Denies fever, recent travel, recent antibiotic use. Denies any dysuria, hematuria, vaginal bleeding or discharge. Denies any blood in her emesis. Denies any blood in her stool. Abdominal pain as cramping coming and going. No previous abdominal surgeries. Nothing makes the pain better or worse.  Patient is a 28 y.o. female presenting with vomiting and diarrhea. The history is provided by the patient.  Emesis Associated symptoms: abdominal pain and diarrhea   Associated symptoms: no arthralgias and no myalgias   Diarrhea Associated symptoms: abdominal pain and vomiting   Associated symptoms: no arthralgias, no fever and no myalgias     Past Medical History  Diagnosis Date  . Bartholin cyst   . Irregular periods/menstrual cycles   . Chlamydia    Past Surgical History  Procedure Laterality Date  . Dilation and curettage of uterus     No family history on file. History  Substance Use Topics  . Smoking status: Never Smoker   . Smokeless tobacco: Never Used  . Alcohol Use: No   OB History    Gravida Para Term Preterm AB TAB SAB Ectopic Multiple Living   4 1 1  0 3 3 0 0 0 1     Review of Systems  Constitutional: Positive for appetite change. Negative for fever and activity change.  Respiratory: Negative for cough, chest tightness and shortness of breath.   Cardiovascular: Negative for chest pain.   Gastrointestinal: Positive for nausea, vomiting, abdominal pain and diarrhea.  Genitourinary: Negative for dysuria, hematuria, vaginal bleeding and vaginal discharge.  Musculoskeletal: Negative for myalgias, back pain and arthralgias.  Skin: Negative for rash.  Neurological: Negative for dizziness, weakness and numbness.  A complete 10 system review of systems was obtained and all systems are negative except as noted in the HPI and PMH.      Allergies  Review of patient's allergies indicates no known allergies.  Home Medications   Prior to Admission medications   Medication Sig Start Date End Date Taking? Authorizing Provider  medroxyPROGESTERone (DEPO-PROVERA) 150 MG/ML injection Inject 150 mg into the muscle every 3 (three) months.   Yes Historical Provider, MD  HYDROcodone-acetaminophen (HYCET) 7.5-325 mg/15 ml solution Take 15 mLs by mouth every 4 (four) hours as needed for moderate pain or severe pain. Patient not taking: Reported on 08/06/2014 06/25/14   Louann SjogrenVictoria L Creech, PA-C  ondansetron (ZOFRAN) 4 MG tablet Take 1 tablet (4 mg total) by mouth every 6 (six) hours. 08/06/14   Glynn OctaveStephen Taffany Heiser, MD   BP 105/67 mmHg  Pulse 61  Temp(Src) 97.9 F (36.6 C) (Oral)  Resp 16  SpO2 100% Physical Exam  Constitutional: She is oriented to person, place, and time. She appears well-developed and well-nourished. No distress.  HENT:  Head: Normocephalic and atraumatic.  Mouth/Throat: Oropharynx is clear and moist. No oropharyngeal exudate.  Eyes: Conjunctivae and EOM are normal. Pupils are equal, round, and reactive to light.  Neck:  Normal range of motion. Neck supple.  No meningismus.  Cardiovascular: Normal rate, regular rhythm, normal heart sounds and intact distal pulses.   No murmur heard. Pulmonary/Chest: Effort normal and breath sounds normal. No respiratory distress.  Abdominal: Soft. There is tenderness. There is no rebound and no guarding.  Diffuse tenderness, no peritoneal  signs  Musculoskeletal: Normal range of motion. She exhibits no edema or tenderness.  Neurological: She is alert and oriented to person, place, and time. No cranial nerve deficit. She exhibits normal muscle tone. Coordination normal.  No ataxia on finger to nose bilaterally. No pronator drift. 5/5 strength throughout. CN 2-12 intact. Negative Romberg. Equal grip strength. Sensation intact. Gait is normal.   Skin: Skin is warm.  Psychiatric: She has a normal mood and affect. Her behavior is normal.  Nursing note and vitals reviewed.   ED Course  Procedures (including critical care time) Labs Review Labs Reviewed  CBC WITH DIFFERENTIAL - Abnormal; Notable for the following:    Platelets 134 (*)    All other components within normal limits  COMPREHENSIVE METABOLIC PANEL - Abnormal; Notable for the following:    Sodium 136 (*)    Glucose, Bld 104 (*)    All other components within normal limits  PREGNANCY, URINE  URINALYSIS, ROUTINE W REFLEX MICROSCOPIC  LIPASE, BLOOD    Imaging Review No results found.   EKG Interpretation None      MDM   Final diagnoses:  Nausea vomiting and diarrhea   abdominal cramping with nausea, vomiting and diarrhea. Abdomen soft without peritoneal signs.  HCG negative.  UA negative.  Labs unremarkable. No vomiting ED. Patient tolerating by mouth. Abdomen soft and nontender.  Suspect viral gastroenteritis.  Supportive care at home with fluids, antiemetics, return precautions discussed.   Glynn OctaveStephen Josephine Wooldridge, MD 08/06/14 [phone]

## 2014-08-06 NOTE — ED Notes (Signed)
Patient given ginger ale per patient's request for fluid challenge.

## 2014-08-06 NOTE — ED Notes (Signed)
MD at bedside. 

## 2014-08-14 ENCOUNTER — Telehealth: Payer: Self-pay | Admitting: *Deleted

## 2014-08-14 NOTE — Telephone Encounter (Signed)
Called patient stating I am returning your phone call. Patient states she missed her depo shot and the last day to get it was 12/16. She's been having cramping but has not started to bleed yet and wants to know when this will happen. Also states she has an appt 2/1 to switch birth control to the implanon. Discussed with patient that any changes in birth control methods will cause irregular bleeding and cramping but that there isn't a way to predict when and if those things will happen. Also discussed with patient that she can get pregnant and to use back up birth control method like condoms until her appt with us. Patient verbalized understanding to all and had no other questions

## 2014-08-14 NOTE — Telephone Encounter (Signed)
Pt left message stating that she has a question for the nurse.

## 2014-09-17 ENCOUNTER — Ambulatory Visit: Payer: Medicaid Other | Admitting: Family Medicine

## 2014-09-17 ENCOUNTER — Telehealth: Payer: Self-pay | Admitting: General Practice

## 2014-09-17 NOTE — Telephone Encounter (Signed)
Patient no showed for appt today. Called patient, no answer- left message stating we are trying to reach you in regards to an appt you missed in our office this afternoon. If you'd like this appt rescheduled please contact our front office staff.

## 2014-10-21 ENCOUNTER — Encounter (HOSPITAL_COMMUNITY): Payer: Self-pay | Admitting: *Deleted

## 2014-10-21 ENCOUNTER — Emergency Department (HOSPITAL_COMMUNITY)
Admission: EM | Admit: 2014-10-21 | Discharge: 2014-10-21 | Disposition: A | Discharge status: Home / Self Care | Payer: Medicaid Other | Attending: Emergency Medicine | Admitting: Emergency Medicine

## 2014-10-21 DIAGNOSIS — R1030 Lower abdominal pain, unspecified: Secondary | ICD-10-CM

## 2014-10-21 DIAGNOSIS — Z8619 Personal history of other infectious and parasitic diseases: Secondary | ICD-10-CM | POA: Insufficient documentation

## 2014-10-21 DIAGNOSIS — T384X5A Adverse effect of oral contraceptives, initial encounter: Secondary | ICD-10-CM | POA: Insufficient documentation

## 2014-10-21 DIAGNOSIS — N912 Amenorrhea, unspecified: Secondary | ICD-10-CM | POA: Insufficient documentation

## 2014-10-21 DIAGNOSIS — Z3202 Encounter for pregnancy test, result negative: Secondary | ICD-10-CM | POA: Insufficient documentation

## 2014-10-21 LAB — CBC WITH DIFFERENTIAL/PLATELET
BASOS ABS: 0 10*3/uL (ref 0.0–0.1)
BASOS PCT: 0 % (ref 0–1)
EOS ABS: 0.2 10*3/uL (ref 0.0–0.7)
Eosinophils Relative: 2 % (ref 0–5)
HCT: 37.7 % (ref 36.0–46.0)
Hemoglobin: 12.7 g/dL (ref 12.0–15.0)
LYMPHS PCT: 44 % (ref 12–46)
Lymphs Abs: 3.1 10*3/uL (ref 0.7–4.0)
MCH: 30.2 pg (ref 26.0–34.0)
MCHC: 33.7 g/dL (ref 30.0–36.0)
MCV: 89.5 fL (ref 78.0–100.0)
MONO ABS: 0.4 10*3/uL (ref 0.1–1.0)
Monocytes Relative: 6 % (ref 3–12)
NEUTROS ABS: 3.3 10*3/uL (ref 1.7–7.7)
Neutrophils Relative %: 47 % (ref 43–77)
PLATELETS: 245 10*3/uL (ref 150–400)
RBC: 4.21 MIL/uL (ref 3.87–5.11)
RDW: 11.9 % (ref 11.5–15.5)
WBC: 7 10*3/uL (ref 4.0–10.5)

## 2014-10-21 LAB — URINALYSIS, ROUTINE W REFLEX MICROSCOPIC
Bilirubin Urine: NEGATIVE
Glucose, UA: NEGATIVE mg/dL
HGB URINE DIPSTICK: NEGATIVE
KETONES UR: NEGATIVE mg/dL
LEUKOCYTES UA: NEGATIVE
Nitrite: NEGATIVE
Protein, ur: NEGATIVE mg/dL
Specific Gravity, Urine: 1.022 (ref 1.005–1.030)
Urobilinogen, UA: 0.2 mg/dL (ref 0.0–1.0)
pH: 5.5 (ref 5.0–8.0)

## 2014-10-21 LAB — WET PREP, GENITAL
CLUE CELLS WET PREP: NONE SEEN
Trich, Wet Prep: NONE SEEN
YEAST WET PREP: NONE SEEN

## 2014-10-21 LAB — BASIC METABOLIC PANEL
Anion gap: 5 (ref 5–15)
BUN: 12 mg/dL (ref 6–23)
CHLORIDE: 106 mmol/L (ref 96–112)
CO2: 24 mmol/L (ref 19–32)
Calcium: 8.9 mg/dL (ref 8.4–10.5)
Creatinine, Ser: 0.71 mg/dL (ref 0.50–1.10)
GFR calc Af Amer: >90 mL/min (ref >90)
Glucose, Bld: 90 mg/dL (ref 70–99)
Potassium: 3.5 mmol/L (ref 3.5–5.1)
SODIUM: 135 mmol/L (ref 135–145)

## 2014-10-21 LAB — POC URINE PREG, ED: Preg Test, Ur: NEGATIVE

## 2014-10-21 NOTE — ED Provider Notes (Signed)
CSN: [id number]     Arrival date & time 10/21/14  1605 History   First MD Initiated Contact with Patient 10/21/14 1642     Chief Complaint  Patient presents with  . Abdominal Pain     (Consider location/radiation/quality/duration/timing/severity/associated sxs/prior Treatment) The history is provided by medical records. No language interpreter was used.     Joyce Rice is a 29 y.o. female [phone] with a hx of irregular menses presents to the Emergency Department complaining of intermittent worsening lower abd and pelvic pain onset several weeks ago occuring up to 3 times per day.  Pt reports pain is sharp, rated at a 9/10 and then resolving completely on its own after lasting for approx several minutes.  Pt reports she has been on the depo-provera for 3 years and decided to stop taking it in December.  Pt reports abd cramping throughout December and January.  She reports the abd cramping resolved around the time the pain began.  Pt was recieveing this at the [facility].  Pt reports her menses has not restarted.  Pt also reports that she has been having unprotected sex with her female partner of 8 years.  Pt reports home pregnancy test was negative.  She reports hx of chlamydia.   Associated symptoms include nausea with occasional vomiting (NBNB emesis).  Pt also endorsees urinary frequency without dysuria, hematuria or urgency.  Nothing makes it better and nothing makes it worse.  Pt denies fever, chills, headache or neck pain, chest pain, shortness of breath, nausea, vomiting, diarrhea, weakness, dizziness, syncope, hematuria.     Past Medical History  Diagnosis Date  . Bartholin cyst   . Irregular periods/menstrual cycles   . Chlamydia    Past Surgical History  Procedure Laterality Date  . Dilation and curettage of uterus     No family history on file. History  Substance Use Topics  . Smoking status: Never Smoker   . Smokeless tobacco: Never Used  . Alcohol Use: No    OB History    Gravida Para Term Preterm AB TAB SAB Ectopic Multiple Living   0 3 3 0 0 0 1     Review of Systems  Constitutional: Negative for fever, diaphoresis, appetite change, fatigue and unexpected weight change.  HENT: Negative for mouth sores and trouble swallowing.   Respiratory: Negative for cough, chest tightness, shortness of breath, wheezing and stridor.   Cardiovascular: Negative for chest pain and palpitations.  Gastrointestinal: Positive for abdominal pain. Negative for nausea, vomiting, diarrhea, constipation, blood in stool, abdominal distention and rectal pain.  Genitourinary: Positive for dysuria. Negative for urgency, frequency, hematuria, flank pain and difficulty urinating.  Musculoskeletal: Negative for back pain, neck pain and neck stiffness.  Skin: Negative for rash.  Neurological: Negative for weakness.  Hematological: Negative for adenopathy.  Psychiatric/Behavioral: Negative for confusion.  All other systems reviewed and are negative.     Allergies  Review of patient's allergies indicates no known allergies.  Home Medications   Prior to Admission medications   Medication Sig Start Date End Date Taking? Authorizing Provider  HYDROcodone-acetaminophen (HYCET) 7.5-325 mg/15 ml solution Take 15 mLs by mouth every 4 (four) hours as needed for moderate pain or severe pain. Patient not taking: Reported on 08/06/2014 06/25/14   Louann Sjogren, PA-C  ondansetron (ZOFRAN) 4 MG tablet Take 1 tablet (4 mg total) by mouth every 6 (six) hours. Patient not taking: Reported on 10/21/2014 08/06/14   Glynn Octave, MD  BP 105/63 mmHg  Pulse 72  Temp(Src) 98.6 F (37 C) (Oral)  Resp 16  SpO2 99%  LMP  (LMP Unknown) Physical Exam  Constitutional: She appears well-developed and well-nourished. No distress.  Awake, alert, nontoxic appearance  HENT:  Head: Normocephalic and atraumatic.  Mouth/Throat: Oropharynx is clear and moist. No oropharyngeal  exudate.  Eyes: Conjunctivae are normal. No scleral icterus.  Neck: Normal range of motion. Neck supple.  Cardiovascular: Normal rate, regular rhythm, normal heart sounds and intact distal pulses.   No murmur heard. Pulmonary/Chest: Effort normal and breath sounds normal. No respiratory distress. She has no wheezes.  Equal chest expansion  Abdominal: Soft. Bowel sounds are normal. She exhibits no distension and no mass. There is no tenderness. There is no rebound and no guarding. Hernia confirmed negative in the right inguinal area and confirmed negative in the left inguinal area.  Genitourinary: Uterus normal. No labial fusion. There is no rash, tenderness or lesion on the right labia. There is no rash, tenderness or lesion on the left labia. Uterus is not deviated, not enlarged, not fixed and not tender. Cervix exhibits no motion tenderness, no discharge and no friability. Right adnexum displays no mass, no tenderness and no fullness. Left adnexum displays no mass, no tenderness and no fullness. No erythema, tenderness or bleeding in the vagina. No foreign body around the vagina. No signs of injury around the vagina. Vaginal discharge (minimal) found.  Musculoskeletal: Normal range of motion. She exhibits no edema.  Lymphadenopathy:       Right: No inguinal adenopathy present.       Left: No inguinal adenopathy present.  Neurological: She is alert.  Speech is clear and goal oriented Moves extremities without ataxia  Skin: Skin is warm and dry. She is not diaphoretic. No erythema.  Psychiatric: She has a normal mood and affect.  Nursing note and vitals reviewed.   ED Course  Procedures (including critical care time) Labs Review Labs Reviewed  WET PREP, GENITAL - Abnormal; Notable for the following:    WBC, Wet Prep HPF POC FEW (*)    All other components within normal limits  CBC WITH DIFFERENTIAL/PLATELET  URINALYSIS, ROUTINE W REFLEX MICROSCOPIC  BASIC METABOLIC PANEL  POC URINE  PREG, ED  I-STAT BETA HCG BLOOD, ED ([facility], [facility], [facility] ONLY)  GC/CHLAMYDIA PROBE AMP ([place])    Imaging Review No results found.   EKG Interpretation None      MDM   Final diagnoses:  Amenorrhea due to Depo Provera  Lower abdominal pain   Joyce Rice presents with intermittent lower abd pain present for several weeks. Pt also concerned about her amenorrhea after stopping her depo-provera.  Pt with abd soft and nontender.  6:30PM Pelvic exam without pain, cervical motion tenderness or discharge. Labs are reassuring. Pregnancy test is negative. Urinalysis without evidence of urinary tract infection. If patient has no evidence of STD on her wet prep will be D/C home with OB/GYN follow-up.  Discussed at length reasons for amenorrhea after Depo-Provera.    9:59 PM Pt eloped from the ER prior to my ability to explain her wet prep results, though she and I had already established a plan of action for follow-up with women's outpatient clinic and my suspicion about the nature of her lower abd pain and amenorrhea from her depo-provera.    Wet prep without evidence of infection. Beta hCG <5. Patient is not pregnant.  BP 105/63 mmHg  Pulse 72  Temp(Src) 98.6 F (37  C) (Oral)  Resp 16  SpO2 99%  LMP  (LMP Unknown)   Dierdre ForthHannah Keone Kamer, PA-C 10/22/14 [id number]  Toy CookeyMegan Docherty, MD 10/23/14 [phone]

## 2014-10-21 NOTE — ED Notes (Signed)
Patient requesting lab results. Patient updated provider is currently available and will update her when she becomes available.

## 2014-10-21 NOTE — ED Notes (Signed)
C/o abd pain, stopped Depo in December 2015, Still has not had a period, Pain is not cramps, these are described as sharp pain, small amt of light vaginal discharge, urinary frequency, no burning.

## 2014-10-21 NOTE — ED Notes (Signed)
Patient upset about not having results for her blood pregnancy results.  Delay explained to patient.  She says she will leave prior to results.

## 2014-10-22 LAB — GC/CHLAMYDIA PROBE AMP (~~LOC~~) NOT AT ARMC
Chlamydia: NEGATIVE
Neisseria Gonorrhea: NEGATIVE

## 2014-10-22 LAB — I-STAT BETA HCG BLOOD, ED (MC, WL, AP ONLY): I-stat hCG, quantitative: <5 m[IU]/mL (ref <5)

## 2014-10-24 ENCOUNTER — Telehealth: Payer: Self-pay | Admitting: *Deleted

## 2014-10-24 NOTE — Telephone Encounter (Addendum)
Kemberly left a voicemail that she went [facility] Monday and wants to get the results transferred to us so we can give them to her. States we can leave a voicemail with the results on her voicemail.   Called Devany and left a message that I am returning her call. She had several test results. I left results of wet prep ( negative ) on her voice mail with instructions to call back if she was wanting anyother  Results   3/9 1211  Pt left new message stating that she would like to know all results from her ED visit on 3/6 and a message can be left on her voice mail.  I called pt back and left message that all of her test results (including CBC, BMET, urinalysis, GC/Chlamydia, and pregnancy test) were normal or negative. She may call back if she has further questions. Diane Day RNC

## 2014-11-15 ENCOUNTER — Ambulatory Visit (INDEPENDENT_AMBULATORY_CARE_PROVIDER_SITE_OTHER): Payer: Medicaid Other | Admitting: Family Medicine

## 2014-11-15 ENCOUNTER — Encounter: Payer: Self-pay | Admitting: Family Medicine

## 2014-11-15 VITALS — BP 111/78 | HR 63 | Temp 98.4°F | Ht 64.0 in | Wt 144.9 lb

## 2014-11-15 DIAGNOSIS — Z3009 Encounter for other general counseling and advice on contraception: Secondary | ICD-10-CM

## 2014-11-15 LAB — POCT PREGNANCY, URINE: PREG TEST UR: NEGATIVE

## 2014-11-15 MED ORDER — NORGESTIMATE-ETH ESTRADIOL 0.25-35 MG-MCG PO TABS: 1.0000 | ORAL_TABLET | Freq: Every day | ORAL | Status: DC

## 2014-11-15 NOTE — Patient Instructions (Signed)

## 2014-11-15 NOTE — Progress Notes (Signed)
   Subjective:    Patient ID: Joyce SanesLaquanda Rice, female    DOB: 1985/10/06, 29 y.o.   MRN: [id number]  HPI 29 yo [id number] here for family planning.  Has been on Depo provera for 3 years without menses.  Last injection was in September of 2015. Has not had a period since then, but having some cramps like she is going to start her period.  Would like to be on OCPs for a few months to regulate cycle, then stop and try to get pregnant.   Review of Systems  Constitutional: Negative for fever and chills.  Gastrointestinal: Positive for abdominal pain. Negative for nausea, vomiting, diarrhea, constipation and abdominal distention.  Genitourinary: Negative for dysuria, vaginal bleeding, vaginal discharge, difficulty urinating, vaginal pain and menstrual problem.       Objective:   Physical Exam  Constitutional: She appears well-developed and well-nourished.  Cardiovascular: Normal rate, regular rhythm and normal heart sounds.  Exam reveals no gallop and no friction rub.   No murmur heard. Pulmonary/Chest: Effort normal and breath sounds normal. No respiratory distress. She has no wheezes. She has no rales. She exhibits no tenderness.  Abdominal: Soft. Bowel sounds are normal. She exhibits no distension and no mass. There is no tenderness. There is no rebound and no guarding.  Skin: Skin is warm and dry.  Psychiatric: She has a normal mood and affect. Her behavior is normal. Judgment and thought content normal.      Assessment & Plan:   Problem List Items Addressed This Visit    None    Visit Diagnoses    Family planning counseling    -  Primary      Prescribed sprintec after negative pregnancy test - which we will do for three months, then patient to stop.  F/u as needed - if having symptoms with sprintec.

## 2015-07-24 ENCOUNTER — Inpatient Hospital Stay (HOSPITAL_COMMUNITY): Payer: Medicaid Other

## 2015-07-24 ENCOUNTER — Encounter (HOSPITAL_COMMUNITY): Payer: Self-pay | Admitting: *Deleted

## 2015-07-24 ENCOUNTER — Inpatient Hospital Stay (HOSPITAL_COMMUNITY)
Admission: AD | Admit: 2015-07-24 | Discharge: 2015-07-24 | Disposition: A | Discharge status: Home / Self Care | Payer: Medicaid Other | Source: Ambulatory Visit | Attending: Obstetrics and Gynecology | Admitting: Obstetrics and Gynecology

## 2015-07-24 DIAGNOSIS — Z3A01 Less than 8 weeks gestation of pregnancy: Secondary | ICD-10-CM | POA: Insufficient documentation

## 2015-07-24 DIAGNOSIS — O219 Vomiting of pregnancy, unspecified: Secondary | ICD-10-CM | POA: Diagnosis not present

## 2015-07-24 DIAGNOSIS — R109 Unspecified abdominal pain: Secondary | ICD-10-CM

## 2015-07-24 DIAGNOSIS — O26899 Other specified pregnancy related conditions, unspecified trimester: Secondary | ICD-10-CM

## 2015-07-24 DIAGNOSIS — Z3491 Encounter for supervision of normal pregnancy, unspecified, first trimester: Secondary | ICD-10-CM

## 2015-07-24 DIAGNOSIS — O209 Hemorrhage in early pregnancy, unspecified: Secondary | ICD-10-CM

## 2015-07-24 DIAGNOSIS — O9989 Other specified diseases and conditions complicating pregnancy, childbirth and the puerperium: Secondary | ICD-10-CM

## 2015-07-24 DIAGNOSIS — R1084 Generalized abdominal pain: Secondary | ICD-10-CM | POA: Insufficient documentation

## 2015-07-24 HISTORY — DX: Headache, unspecified: R51.9

## 2015-07-24 HISTORY — DX: Unspecified infectious disease: B99.9

## 2015-07-24 HISTORY — DX: Headache: R51

## 2015-07-24 LAB — URINALYSIS, ROUTINE W REFLEX MICROSCOPIC
BILIRUBIN URINE: NEGATIVE
Glucose, UA: NEGATIVE mg/dL
Hgb urine dipstick: NEGATIVE
Ketones, ur: NEGATIVE mg/dL
Leukocytes, UA: NEGATIVE
Nitrite: NEGATIVE
PROTEIN: NEGATIVE mg/dL
Specific Gravity, Urine: >1.03 — ABNORMAL HIGH (ref 1.005–1.030)
pH: 5.5 (ref 5.0–8.0)

## 2015-07-24 LAB — CBC
HCT: 34.7 % — ABNORMAL LOW (ref 36.0–46.0)
Hemoglobin: 12 g/dL (ref 12.0–15.0)
MCH: 30 pg (ref 26.0–34.0)
MCHC: 34.6 g/dL (ref 30.0–36.0)
MCV: 86.8 fL (ref 78.0–100.0)
PLATELETS: 251 10*3/uL (ref 150–400)
RBC: 4 MIL/uL (ref 3.87–5.11)
RDW: 12.2 % (ref 11.5–15.5)
WBC: 9.9 10*3/uL (ref 4.0–10.5)

## 2015-07-24 LAB — WET PREP, GENITAL
Clue Cells Wet Prep HPF POC: NONE SEEN
SPERM: NONE SEEN
Trich, Wet Prep: NONE SEEN
Yeast Wet Prep HPF POC: NONE SEEN

## 2015-07-24 LAB — HCG, QUANTITATIVE, PREGNANCY: HCG, BETA CHAIN, QUANT, S: 54139 m[IU]/mL — AB (ref <5)

## 2015-07-24 LAB — POCT PREGNANCY, URINE: PREG TEST UR: POSITIVE — AB

## 2015-07-24 MED ORDER — PROMETHAZINE HCL 25 MG PO TABS: 25.0000 mg | ORAL_TABLET | Freq: Four times a day (QID) | ORAL | Status: DC | PRN

## 2015-07-24 NOTE — Discharge Instructions (Signed)
First Trimester of Pregnancy The first trimester of pregnancy is from week 1 until the end of week 12 (months 1 through 3). A week after a sperm fertilizes an egg, the egg will implant on the wall of the uterus. This embryo will begin to develop into a baby. Genes from you and your partner are forming the baby. The female genes determine whether the baby is a boy or a girl. At 6-8 weeks, the eyes and face are formed, and the heartbeat can be seen on ultrasound. At the end of 12 weeks, all the baby's organs are formed.  Now that you are pregnant, you will want to do everything you can to have a healthy baby. Two of the most important things are to get good prenatal care and to follow your health care provider's instructions. Prenatal care is all the medical care you receive before the baby's birth. This care will help prevent, find, and treat any problems during the pregnancy and childbirth. BODY CHANGES Your body goes through many changes during pregnancy. The changes vary from woman to woman.   You may gain or lose a couple of pounds at first.  You may feel sick to your stomach (nauseous) and throw up (vomit). If the vomiting is uncontrollable, call your health care provider.  You may tire easily.  You may develop headaches that can be relieved by medicines approved by your health care provider.  You may urinate more often. Painful urination may mean you have a bladder infection.  You may develop heartburn as a result of your pregnancy.  You may develop constipation because certain hormones are causing the muscles that push waste through your intestines to slow down.  You may develop hemorrhoids or swollen, bulging veins (varicose veins).  Your breasts may begin to grow larger and become tender. Your nipples may stick out more, and the tissue that surrounds them (areola) may become darker.  Your gums may bleed and may be sensitive to brushing and flossing.  Dark spots or blotches  (chloasma, mask of pregnancy) may develop on your face. This will likely fade after the baby is born.  Your menstrual periods will stop.  You may have a loss of appetite.  You may develop cravings for certain kinds of food.  You may have changes in your emotions from day to day, such as being excited to be pregnant or being concerned that something may go wrong with the pregnancy and baby.  You may have more vivid and strange dreams.  You may have changes in your hair. These can include thickening of your hair, rapid growth, and changes in texture. Some women also have hair loss during or after pregnancy, or hair that feels dry or thin. Your hair will most likely return to normal after your baby is born. WHAT TO EXPECT AT YOUR PRENATAL VISITS During a routine prenatal visit:  You will be weighed to make sure you and the baby are growing normally.  Your blood pressure will be taken.  Your abdomen will be measured to track your baby's growth.  The fetal heartbeat will be listened to starting around week 10 or 12 of your pregnancy.  Test results from any previous visits will be discussed. Your health care provider may ask you:  How you are feeling.  If you are feeling the baby move.  If you have had any abnormal symptoms, such as leaking fluid, bleeding, severe headaches, or abdominal cramping.  If you are using any tobacco products,   including cigarettes, chewing tobacco, and electronic cigarettes.  If you have any questions. Other tests that may be performed during your first trimester include:  Blood tests to find your blood type and to check for the presence of any previous infections. They will also be used to check for low iron levels (anemia) and Rh antibodies. Later in the pregnancy, blood tests for diabetes will be done along with other tests if problems develop.  Urine tests to check for infections, diabetes, or protein in the urine.  An ultrasound to confirm the  proper growth and development of the baby.  An amniocentesis to check for possible genetic problems.  Fetal screens for spina bifida and Down syndrome.  You may need other tests to make sure you and the baby are doing well.  HIV (human immunodeficiency virus) testing. Routine prenatal testing includes screening for HIV, unless you choose not to have this test. HOME CARE INSTRUCTIONS  Medicines  Follow your health care provider's instructions regarding medicine use. Specific medicines may be either safe or unsafe to take during pregnancy.  Take your prenatal vitamins as directed.  If you develop constipation, try taking a stool softener if your health care provider approves. Diet  Eat regular, well-balanced meals. Choose a variety of foods, such as meat or vegetable-based protein, fish, milk and low-fat dairy products, vegetables, fruits, and whole grain breads and cereals. Your health care provider will help you determine the amount of weight gain that is right for you.  Avoid raw meat and uncooked cheese. These carry germs that can cause birth defects in the baby.  Eating four or five small meals rather than three large meals a day may help relieve nausea and vomiting. If you start to feel nauseous, eating a few soda crackers can be helpful. Drinking liquids between meals instead of during meals also seems to help nausea and vomiting.  If you develop constipation, eat more high-fiber foods, such as fresh vegetables or fruit and whole grains. Drink enough fluids to keep your urine clear or pale yellow. Activity and Exercise  Exercise only as directed by your health care provider. Exercising will help you:  Control your weight.  Stay in shape.  Be prepared for labor and delivery.  Experiencing pain or cramping in the lower abdomen or low back is a good sign that you should stop exercising. Check with your health care provider before continuing normal exercises.  Try to avoid  standing for long periods of time. Move your legs often if you must stand in one place for a long time.  Avoid heavy lifting.  Wear low-heeled shoes, and practice good posture.  You may continue to have sex unless your health care provider directs you otherwise. Relief of Pain or Discomfort  Wear a good support bra for breast tenderness.   Take warm sitz baths to soothe any pain or discomfort caused by hemorrhoids. Use hemorrhoid cream if your health care provider approves.   Rest with your legs elevated if you have leg cramps or low back pain.  If you develop varicose veins in your legs, wear support hose. Elevate your feet for 15 minutes, 3-4 times a day. Limit salt in your diet. Prenatal Care  Schedule your prenatal visits by the twelfth week of pregnancy. They are usually scheduled monthly at first, then more often in the last 2 months before delivery.  Write down your questions. Take them to your prenatal visits.  Keep all your prenatal visits as directed by your   health care provider. Safety  Wear your seat belt at all times when driving.  Make a list of emergency phone numbers, including numbers for family, friends, the hospital, and police and fire departments. General Tips  Ask your health care provider for a referral to a local prenatal education class. Begin classes no later than at the beginning of month 6 of your pregnancy.  Ask for help if you have counseling or nutritional needs during pregnancy. Your health care provider can offer advice or refer you to specialists for help with various needs.  Do not use hot tubs, steam rooms, or saunas.  Do not douche or use tampons or scented sanitary pads.  Do not cross your legs for long periods of time.  Avoid cat litter boxes and soil used by cats. These carry germs that can cause birth defects in the baby and possibly loss of the fetus by miscarriage or stillbirth.  Avoid all smoking, herbs, alcohol, and medicines  not prescribed by your health care provider. Chemicals in these affect the formation and growth of the baby.  Do not use any tobacco products, including cigarettes, chewing tobacco, and electronic cigarettes. If you need help quitting, ask your health care provider. You may receive counseling support and other resources to help you quit.  Schedule a dentist appointment. At home, brush your teeth with a soft toothbrush and be gentle when you floss. SEEK MEDICAL CARE IF:   You have dizziness.  You have mild pelvic cramps, pelvic pressure, or nagging pain in the abdominal area.  You have persistent nausea, vomiting, or diarrhea.  You have a bad smelling vaginal discharge.  You have pain with urination.  You notice increased swelling in your face, hands, legs, or ankles. SEEK IMMEDIATE MEDICAL CARE IF:   You have a fever.  You are leaking fluid from your vagina.  You have spotting or bleeding from your vagina.  You have severe abdominal cramping or pain.  You have rapid weight gain or loss.  You vomit blood or material that looks like coffee grounds.  You are exposed to German measles and have never had them.  You are exposed to fifth disease or chickenpox.  You develop a severe headache.  You have shortness of breath.  You have any kind of trauma, such as from a fall or a car accident.   This information is not intended to replace advice given to you by your health care provider. Make sure you discuss any questions you have with your health care provider.   Document Released: 07/28/2001 Document Revised: 08/24/2014 Document Reviewed: 06/13/2013 Elsevier Interactive Patient Education 2016 [employer]  Safe Medications in Pregnancy   Acne: Benzoyl Peroxide Salicylic Acid  Backache/Headache: Tylenol: 2 regular strength every 4 hours OR              2 Extra strength every 6 hours  Colds/Coughs/Allergies: Benadryl (alcohol free) 25 mg every 6 hours as  needed Breath right strips Claritin Cepacol throat lozenges Chloraseptic throat spray Cold-Eeze- up to three times per day Cough drops, alcohol free Flonase (by prescription only) Guaifenesin Mucinex Robitussin DM (plain only, alcohol free) Saline nasal spray/drops Sudafed (pseudoephedrine) & Actifed ** use only after [redacted] weeks gestation and if you do not have high blood pressure Tylenol Vicks Vaporub Zinc lozenges Zyrtec   Constipation: Colace Ducolax suppositories Fleet enema Glycerin suppositories Metamucil Milk of magnesia Miralax Senokot Smooth move tea  Diarrhea: Kaopectate Imodium A-D  *NO pepto Bismol  Hemorrhoids: Anusol Anusol   HC Preparation H Tucks  Indigestion: Tums Maalox Mylanta Zantac  Pepcid  Insomnia: Benadryl (alcohol free) 25mg every 6 hours as needed Tylenol PM Unisom, no Gelcaps  Leg Cramps: Tums MagGel  Nausea/Vomiting:  Bonine Dramamine Emetrol Ginger extract Sea bands Meclizine  Nausea medication to take during pregnancy:  Unisom (doxylamine succinate 25 mg tablets) Take one tablet daily at bedtime. If symptoms are not adequately controlled, the dose can be increased to a maximum recommended dose of two tablets daily (1/2 tablet in the morning, 1/2 tablet mid-afternoon and one at bedtime). Vitamin B6 100mg tablets. Take one tablet twice a day (up to 200 mg per day).  Skin Rashes: Aveeno products Benadryl cream or 25mg every 6 hours as needed Calamine Lotion 1% cortisone cream  Yeast infection: Gyne-lotrimin 7 Monistat 7   **If taking multiple medications, please check labels to avoid duplicating the same active ingredients **take medication as directed on the label ** Do not exceed 4000 mg of tylenol in 24 hours **Do not take medications that contain aspirin or ibuprofen    

## 2015-07-24 NOTE — MAU Provider Note (Signed)
History     CSN: [id number]  Arrival date and time: 07/24/15 1751   First Provider Initiated Contact with Patient 07/24/15 1847       Chief Complaint  Patient presents with  . Abdominal Pain  . Nausea  . Possible Pregnancy   HPI Joyce Rice is a 29 y.o. [id number] at 3822w0d who presents with abdominal pain.  Reports generalized abdominal pain that occurs in different areas of her entire abdomen. States pain comes & goes for the last 2 weeks. Denies any pain currently. No treatment. Denies alleviating or aggravating factors.  Denies vaginal bleeding or discharge.  Reports nausea & vomiting daily but states able to keep down food.  Denies diarrhea or constipation.  Denies fever or urinary complaints.   OB History    Gravida Para Term Preterm AB TAB SAB Ectopic Multiple Living   4 1 1  0 2 2 0 0 0 1      Past Medical History  Diagnosis Date  . Bartholin cyst   . Irregular periods/menstrual cycles   . Chlamydia   . Headache   . Infection     UTI    Past Surgical History  Procedure Laterality Date  . Dilation and curettage of uterus    . Induced abortion      Family History  Problem Relation Age of Onset  . Hearing loss Neg Hx     Social History  Substance Use Topics  . Smoking status: Never Smoker   . Smokeless tobacco: Never Used  . Alcohol Use: Yes     Comment: once or twice a year    Allergies: No Known Allergies  Prescriptions prior to admission  Medication Sig Dispense Refill Last Dose  . norgestimate-ethinyl estradiol (ORTHO-CYCLEN,SPRINTEC,PREVIFEM) 0.25-35 MG-MCG tablet Take 1 tablet by mouth daily. (Patient not taking: Reported on 07/24/2015) 1 Package 11     Review of Systems  Constitutional: Negative.   Gastrointestinal: Positive for nausea, vomiting and abdominal pain (currently no pain). Negative for diarrhea and constipation.  Genitourinary: Negative.    Physical Exam   Blood pressure 119/70, pulse 76, temperature 98 F (36.7 C),  temperature source Oral, resp. rate 18, height 5' 1.5" (1.562 m), weight 146 lb 3.2 oz (66.316 kg), last menstrual period 06/05/2015.  Physical Exam  Nursing note and vitals reviewed. Constitutional: She is oriented to person, place, and time. She appears well-developed and well-nourished. No distress.  HENT:  Head: Normocephalic and atraumatic.  Eyes: Conjunctivae are normal. Right eye exhibits no discharge. Left eye exhibits no discharge. No scleral icterus.  Neck: Normal range of motion.  Cardiovascular: Normal rate, regular rhythm and normal heart sounds.   No murmur heard. Respiratory: Effort normal and breath sounds normal. No respiratory distress. She has no wheezes.  GI: Soft. Bowel sounds are normal. She exhibits no distension. There is tenderness (generalized). There is no rebound.  Genitourinary: Vagina normal. Cervix exhibits discharge (minimal amount of white mucoid discharge). Cervix exhibits no motion tenderness and no friability.  Cervix closed  Neurological: She is alert and oriented to person, place, and time.  Skin: Skin is warm and dry. She is not diaphoretic.  Psychiatric: She has a normal mood and affect. Her behavior is normal. Judgment and thought content normal.    MAU Course  Procedures Results for orders placed or performed during the hospital encounter of 07/24/15 (from the past 24 hour(s))  Urinalysis, Routine w reflex microscopic (not at [facility])     Status: Abnormal   Collection  Time: 07/24/15  6:15 PM  Result Value Ref Range   Color, Urine YELLOW YELLOW   APPearance HAZY (A) CLEAR   Specific Gravity, Urine >1.030 (H) 1.005 - 1.030   pH 5.5 5.0 - 8.0   Glucose, UA NEGATIVE NEGATIVE mg/dL   Hgb urine dipstick NEGATIVE NEGATIVE   Bilirubin Urine NEGATIVE NEGATIVE   Ketones, ur NEGATIVE NEGATIVE mg/dL   Protein, ur NEGATIVE NEGATIVE mg/dL   Nitrite NEGATIVE NEGATIVE   Leukocytes, UA NEGATIVE NEGATIVE  Pregnancy, urine POC     Status: Abnormal    Collection Time: 07/24/15  6:17 PM  Result Value Ref Range   Preg Test, Ur POSITIVE (A) NEGATIVE  CBC     Status: Abnormal   Collection Time: 07/24/15  6:37 PM  Result Value Ref Range   WBC 9.9 4.0 - 10.5 K/uL   RBC 4.00 3.87 - 5.11 MIL/uL   Hemoglobin 12.0 12.0 - 15.0 g/dL   HCT 09.8 (L) 11.9 - 14.7 %   MCV 86.8 78.0 - 100.0 fL   MCH 30.0 26.0 - 34.0 pg   MCHC 34.6 30.0 - 36.0 g/dL   RDW 82.9 56.2 - 13.0 %   Platelets 251 150 - 400 K/uL  hCG, quantitative, pregnancy     Status: Abnormal   Collection Time: 07/24/15  6:37 PM  Result Value Ref Range   hCG, Beta Chain, Quant, S 54139 (H) <5 mIU/mL  Wet prep, genital     Status: Abnormal   Collection Time: 07/24/15  6:55 PM  Result Value Ref Range   Yeast Wet Prep HPF POC NONE SEEN NONE SEEN   Trich, Wet Prep NONE SEEN NONE SEEN   Clue Cells Wet Prep HPF POC NONE SEEN NONE SEEN   WBC, Wet Prep HPF POC MODERATE (A) NONE SEEN   Sperm NONE SEEN    US Ob Comp Less 14 Wks  07/24/2015  CLINICAL DATA:  Cramping pelvic pain for 2 weeks. Positive pregnancy test. EXAM: OBSTETRIC <14 WK [place] AND TRANSVAGINAL OB US TECHNIQUE: Both transabdominal and transvaginal ultrasound examinations were performed for complete evaluation of the gestation as well as the maternal uterus, adnexal regions, and pelvic cul-de-sac. Transvaginal technique was performed to assess early pregnancy. COMPARISON:  None. FINDINGS: Intrauterine gestational sac: Visualized/normal in shape. Yolk sac:  Present Embryo:  Present Cardiac Activity: Present Heart Rate: 134  bpm MSD:   mm    w     d CRL:  9.7  mm   7 w   0 d                  [place] EDC: 03/11/2016 Maternal uterus/adnexae: No subchorionic hemorrhage. Normal right ovary. Normal left ovary. No free pelvic fluid collections. IMPRESSION: Single living intrauterine embryo estimated at 7 weeks and 0 days gestation. No subchorionic hemorrhage. Normal ovaries. Electronically Signed   By: Rudie Meyer M.D.   On: 07/24/2015 19:28   US Ob  Transvaginal  07/24/2015  CLINICAL DATA:  Cramping pelvic pain for 2 weeks. Positive pregnancy test. EXAM: OBSTETRIC <14 WK [place] AND TRANSVAGINAL OB US TECHNIQUE: Both transabdominal and transvaginal ultrasound examinations were performed for complete evaluation of the gestation as well as the maternal uterus, adnexal regions, and pelvic cul-de-sac. Transvaginal technique was performed to assess early pregnancy. COMPARISON:  None. FINDINGS: Intrauterine gestational sac: Visualized/normal in shape. Yolk sac:  Present Embryo:  Present Cardiac Activity: Present Heart Rate: 134  bpm MSD:   mm    w  d CRL:  9.7  mm   7 w   0 d                  [place] EDC: 03/11/2016 Maternal uterus/adnexae: No subchorionic hemorrhage. Normal right ovary. Normal left ovary. No free pelvic fluid collections. IMPRESSION: Single living intrauterine embryo estimated at 7 weeks and 0 days gestation. No subchorionic hemorrhage. Normal ovaries. Electronically Signed   By: Rudie Meyer M.D.   On: 07/24/2015 19:28    MDM O negative - patient denies vaginal bleeding CBC, BHCG, GC/CT, wet prep Ultrasound shows SIUP at 7wks Assessment and Plan  A: 1. Abdominal pain in pregnancy   2. Normal IUP (intrauterine pregnancy) on prenatal ultrasound, first trimester   3. Nausea and vomiting in pregnancy prior to [redacted] weeks gestation    P: Discharge home Pregnancy verification letter & provider list given Start prenatal care Discussed reasons to return to MAU Rx phenergan for nausea/vomiting GC/CT & HIV pending  Judeth Horn, NP  07/24/2015, 6:47 PM

## 2015-07-24 NOTE — MAU Note (Addendum)
+  HPT 2 wks.  Cramping in lower abd, been going on, but getting worse.  Has been nauseated, hard to keep food down at times. Frequent headaches, has not taken anything

## 2015-07-25 LAB — GC/CHLAMYDIA PROBE AMP (~~LOC~~) NOT AT ARMC
CHLAMYDIA, DNA PROBE: NEGATIVE
Neisseria Gonorrhea: NEGATIVE

## 2015-07-25 LAB — HIV ANTIBODY (ROUTINE TESTING W REFLEX): HIV Screen 4th Generation wRfx: NONREACTIVE

## 2015-08-05 ENCOUNTER — Encounter (HOSPITAL_COMMUNITY): Payer: Self-pay

## 2015-08-05 ENCOUNTER — Inpatient Hospital Stay (HOSPITAL_COMMUNITY)
Admission: AD | Admit: 2015-08-05 | Discharge: 2015-08-05 | Disposition: A | Discharge status: Home / Self Care | Payer: Medicaid Other | Source: Ambulatory Visit | Attending: Obstetrics and Gynecology | Admitting: Obstetrics and Gynecology

## 2015-08-05 DIAGNOSIS — Z3A08 8 weeks gestation of pregnancy: Secondary | ICD-10-CM | POA: Insufficient documentation

## 2015-08-05 DIAGNOSIS — O210 Mild hyperemesis gravidarum: Secondary | ICD-10-CM | POA: Insufficient documentation

## 2015-08-05 DIAGNOSIS — O219 Vomiting of pregnancy, unspecified: Secondary | ICD-10-CM

## 2015-08-05 LAB — CBC WITH DIFFERENTIAL/PLATELET
Basophils Absolute: 0 10*3/uL (ref 0.0–0.1)
Basophils Relative: 0 %
Eosinophils Absolute: 0 10*3/uL (ref 0.0–0.7)
Eosinophils Relative: 0 %
HEMATOCRIT: 37.1 % (ref 36.0–46.0)
HEMOGLOBIN: 12.7 g/dL (ref 12.0–15.0)
LYMPHS ABS: 2.1 10*3/uL (ref 0.7–4.0)
Lymphocytes Relative: 26 %
MCH: 30.1 pg (ref 26.0–34.0)
MCHC: 34.2 g/dL (ref 30.0–36.0)
MCV: 87.9 fL (ref 78.0–100.0)
MONO ABS: 0.6 10*3/uL (ref 0.1–1.0)
MONOS PCT: 8 %
NEUTROS ABS: 5.3 10*3/uL (ref 1.7–7.7)
NEUTROS PCT: 66 %
Platelets: 266 10*3/uL (ref 150–400)
RBC: 4.22 MIL/uL (ref 3.87–5.11)
RDW: 12.4 % (ref 11.5–15.5)
WBC: 8.1 10*3/uL (ref 4.0–10.5)

## 2015-08-05 LAB — URINE MICROSCOPIC-ADD ON: RBC / HPF: NONE SEEN RBC/hpf (ref 0–5)

## 2015-08-05 LAB — COMPREHENSIVE METABOLIC PANEL
ALBUMIN: 4.5 g/dL (ref 3.5–5.0)
ALT: 14 U/L (ref 14–54)
AST: 17 U/L (ref 15–41)
Alkaline Phosphatase: 49 U/L (ref 38–126)
Anion gap: 12 (ref 5–15)
BUN: 15 mg/dL (ref 6–20)
CHLORIDE: 103 mmol/L (ref 101–111)
CO2: 23 mmol/L (ref 22–32)
CREATININE: 0.7 mg/dL (ref 0.44–1.00)
Calcium: 9.5 mg/dL (ref 8.9–10.3)
GFR calc non Af Amer: >60 mL/min (ref >60)
GLUCOSE: 89 mg/dL (ref 65–99)
Potassium: 3.8 mmol/L (ref 3.5–5.1)
SODIUM: 138 mmol/L (ref 135–145)
Total Bilirubin: 0.8 mg/dL (ref 0.3–1.2)
Total Protein: 8.4 g/dL — ABNORMAL HIGH (ref 6.5–8.1)

## 2015-08-05 LAB — URINALYSIS, ROUTINE W REFLEX MICROSCOPIC
Bilirubin Urine: NEGATIVE
Glucose, UA: NEGATIVE mg/dL
HGB URINE DIPSTICK: NEGATIVE
Ketones, ur: >80 mg/dL — AB
NITRITE: NEGATIVE
PROTEIN: 30 mg/dL — AB
pH: 6 (ref 5.0–8.0)

## 2015-08-05 MED ORDER — PROMETHAZINE HCL 25 MG/ML IJ SOLN
25.0000 mg | Freq: Once | INTRAVENOUS | Status: DC
  Filled 2015-08-05: qty 1

## 2015-08-05 MED ORDER — PROMETHAZINE HCL 12.5 MG PO TABS: 12.5000 mg | ORAL_TABLET | Freq: Every evening | ORAL | Status: DC | PRN

## 2015-08-05 MED ORDER — METOCLOPRAMIDE HCL 10 MG PO TABS: 10.0000 mg | ORAL_TABLET | Freq: Three times a day (TID) | ORAL | Status: DC

## 2015-08-05 MED ORDER — M.V.I. ADULT IV INJ
Freq: Once | INTRAVENOUS | Status: AC
  Administered 2015-08-05: 15:00:00 via INTRAVENOUS
  Filled 2015-08-05: qty 10

## 2015-08-05 NOTE — MAU Provider Note (Signed)
History     CSN: [id number]  Arrival date and time: 08/05/15 1149   None     Chief Complaint  Patient presents with  . Nausea  . Emesis During Pregnancy   HPI Joyce Rice is 29 y.o. G4P1021 4934w5d weeks presenting with nausea and vomiting X 1 weeks.  Was seen in MAU on 07/26/15 for abdominal pain.  Was having mild nausea at that time,  given Phenergan at that visit.  States it did not help.  She is unable to keep food and fluids down.  She states she is thirsty unable to drink.  Past Medical History  Diagnosis Date  . Bartholin cyst   . Irregular periods/menstrual cycles   . Chlamydia   . Headache   . Infection     UTI    Past Surgical History  Procedure Laterality Date  . Dilation and curettage of uterus    . Induced abortion      Family History  Problem Relation Age of Onset  . Hearing loss Neg Hx     Social History  Substance Use Topics  . Smoking status: Never Smoker   . Smokeless tobacco: Never Used  . Alcohol Use: Yes     Comment: once or twice a year    Allergies: No Known Allergies  Prescriptions prior to admission  Medication Sig Dispense Refill Last Dose  . promethazine (PHENERGAN) 25 MG tablet Take 1 tablet (25 mg total) by mouth every 6 (six) hours as needed for nausea or vomiting. (Patient not taking: Reported on 08/05/2015) 30 tablet 0 Not Taking at Unknown time    Review of Systems  Constitutional: Negative for fever and chills.  Gastrointestinal: Positive for nausea and vomiting. Negative for abdominal pain.  Genitourinary:       Neg for vaginal bleeding or discharge.  Neurological: Negative for headaches.   Physical Exam   Blood pressure 109/62, pulse 70, temperature 98.1 F (36.7 C), temperature source Oral, resp. rate 18, height 5' 1.5" (1.562 m), weight 141 lb (63.957 kg), last menstrual period 06/05/2015.  Physical Exam  Vitals reviewed. Constitutional: She is oriented to person, place, and time. She appears well-developed and  well-nourished. No distress.  HENT:  Head: Normocephalic.  Neck: Normal range of motion.  Cardiovascular: Normal rate.   Neurological: She is alert and oriented to person, place, and time.  Skin: Skin is warm and dry.  Psychiatric: She has a normal mood and affect. Her behavior is normal.   Results for orders placed or performed during the hospital encounter of 08/05/15 (from the past 24 hour(s))  Urinalysis, Routine w reflex microscopic (not at [facility])     Status: Abnormal   Collection Time: 08/05/15 12:00 PM  Result Value Ref Range   Color, Urine YELLOW YELLOW   APPearance CLOUDY (A) CLEAR   Specific Gravity, Urine >1.030 (H) 1.005 - 1.030   pH 6.0 5.0 - 8.0   Glucose, UA NEGATIVE NEGATIVE mg/dL   Hgb urine dipstick NEGATIVE NEGATIVE   Bilirubin Urine NEGATIVE NEGATIVE   Ketones, ur >80 (A) NEGATIVE mg/dL   Protein, ur 30 (A) NEGATIVE mg/dL   Nitrite NEGATIVE NEGATIVE   Leukocytes, UA SMALL (A) NEGATIVE  Urine microscopic-add on     Status: Abnormal   Collection Time: 08/05/15 12:00 PM  Result Value Ref Range   Squamous Epithelial / LPF 6-30 (A) NONE SEEN   WBC, UA 0-5 0 - 5 WBC/hpf   RBC / HPF NONE SEEN 0 - 5 RBC/hpf  Bacteria, UA FEW (A) NONE SEEN   Urine-Other MUCOUS PRESENT   Comprehensive metabolic panel     Status: Abnormal   Collection Time: 08/05/15 12:23 PM  Result Value Ref Range   Sodium 138 135 - 145 mmol/L   Potassium 3.8 3.5 - 5.1 mmol/L   Chloride 103 101 - 111 mmol/L   CO2 23 22 - 32 mmol/L   Glucose, Bld 89 65 - 99 mg/dL   BUN 15 6 - 20 mg/dL   Creatinine, Ser 1.61 0.44 - 1.00 mg/dL   Calcium 9.5 8.9 - 09.6 mg/dL   Total Protein 8.4 (H) 6.5 - 8.1 g/dL   Albumin 4.5 3.5 - 5.0 g/dL   AST 17 15 - 41 U/L   ALT 14 14 - 54 U/L   Alkaline Phosphatase 49 38 - 126 U/L   Total Bilirubin 0.8 0.3 - 1.2 mg/dL   GFR calc non Af Amer >60 >60 mL/min   GFR calc Af Amer >60 >60 mL/min   Anion gap 12 5 - 15  CBC with Differential/Platelet     Status: None    Collection Time: 08/05/15 12:23 PM  Result Value Ref Range   WBC 8.1 4.0 - 10.5 K/uL   RBC 4.22 3.87 - 5.11 MIL/uL   Hemoglobin 12.7 12.0 - 15.0 g/dL   HCT 04.5 40.9 - 81.1 %   MCV 87.9 78.0 - 100.0 fL   MCH 30.1 26.0 - 34.0 pg   MCHC 34.2 30.0 - 36.0 g/dL   RDW 91.4 78.2 - 95.6 %   Platelets 266 150 - 400 K/uL   Neutrophils Relative % 66 %   Neutro Abs 5.3 1.7 - 7.7 K/uL   Lymphocytes Relative 26 %   Lymphs Abs 2.1 0.7 - 4.0 K/uL   Monocytes Relative 8 %   Monocytes Absolute 0.6 0.1 - 1.0 K/uL   Eosinophils Relative 0 %   Eosinophils Absolute 0.0 0.0 - 0.7 K/uL   Basophils Relative 0 %   Basophils Absolute 0.0 0.0 - 0.1 K/uL   MAU Course  Procedures  MDM MSE Labs Exam IV hydration--D5LR with Phenergan  IV Feeling a little better after 1st liter infused.  Has not vomited.  Will add 2nd liter-LR with Multivitamins.  Patient has not vomited since IV hydration began.   17:30  2nd bag of fluid in, feeling better.  Patient is tolerating crackers and Sprite without vomiting.   Patient is sleepy, calling her sister to come get her.  I am not comfortable with her driving.  We discussed this prior to IV hydration and she agreed to have someone pick her up.  Assessment and Plan  A:  Nausea and vomitiing in 1st trimester pregnancy  P:  Begin prenatal care with MD of her choice  List of OB physician given to patient.       Rx for Reglan to take tid before meals and then Phenergan at hs     Kenner Lewan,EVE M 08/05/2015, 2:22 PM

## 2015-08-05 NOTE — MAU Note (Signed)
Patient present with N/V x 1 week throwing up everything.

## 2015-08-05 NOTE — Discharge Instructions (Signed)

## 2015-08-14 ENCOUNTER — Inpatient Hospital Stay (HOSPITAL_COMMUNITY)
Admission: AD | Admit: 2015-08-14 | Discharge: 2015-08-15 | Disposition: A | Discharge status: Home / Self Care | Payer: Managed Care, Other (non HMO) | Source: Ambulatory Visit | Attending: Obstetrics & Gynecology | Admitting: Obstetrics & Gynecology

## 2015-08-14 ENCOUNTER — Encounter (HOSPITAL_COMMUNITY): Payer: Self-pay

## 2015-08-14 DIAGNOSIS — Z3A10 10 weeks gestation of pregnancy: Secondary | ICD-10-CM | POA: Diagnosis not present

## 2015-08-14 DIAGNOSIS — O219 Vomiting of pregnancy, unspecified: Secondary | ICD-10-CM | POA: Diagnosis not present

## 2015-08-14 DIAGNOSIS — R112 Nausea with vomiting, unspecified: Secondary | ICD-10-CM | POA: Diagnosis present

## 2015-08-14 LAB — CBC
HEMATOCRIT: 37.1 % (ref 36.0–46.0)
Hemoglobin: 12.9 g/dL (ref 12.0–15.0)
MCH: 30.3 pg (ref 26.0–34.0)
MCHC: 34.8 g/dL (ref 30.0–36.0)
MCV: 87.1 fL (ref 78.0–100.0)
Platelets: 242 10*3/uL (ref 150–400)
RBC: 4.26 MIL/uL (ref 3.87–5.11)
RDW: 12.5 % (ref 11.5–15.5)
WBC: 7.4 10*3/uL (ref 4.0–10.5)

## 2015-08-14 LAB — URINALYSIS, ROUTINE W REFLEX MICROSCOPIC
Glucose, UA: NEGATIVE mg/dL
Leukocytes, UA: NEGATIVE
NITRITE: NEGATIVE
PH: 6 (ref 5.0–8.0)
Protein, ur: 30 mg/dL — AB
Specific Gravity, Urine: >1.03 — ABNORMAL HIGH (ref 1.005–1.030)

## 2015-08-14 LAB — COMPREHENSIVE METABOLIC PANEL
ALT: 14 U/L (ref 14–54)
ANION GAP: 13 (ref 5–15)
AST: 19 U/L (ref 15–41)
Albumin: 4.6 g/dL (ref 3.5–5.0)
Alkaline Phosphatase: 50 U/L (ref 38–126)
BUN: 17 mg/dL (ref 6–20)
CHLORIDE: 103 mmol/L (ref 101–111)
CO2: 21 mmol/L — ABNORMAL LOW (ref 22–32)
Calcium: 10 mg/dL (ref 8.9–10.3)
Creatinine, Ser: 0.66 mg/dL (ref 0.44–1.00)
GFR calc Af Amer: >60 mL/min (ref >60)
GFR calc non Af Amer: >60 mL/min (ref >60)
GLUCOSE: 78 mg/dL (ref 65–99)
POTASSIUM: 3.7 mmol/L (ref 3.5–5.1)
Sodium: 137 mmol/L (ref 135–145)
TOTAL PROTEIN: 7.9 g/dL (ref 6.5–8.1)
Total Bilirubin: 1 mg/dL (ref 0.3–1.2)

## 2015-08-14 LAB — LIPASE, BLOOD: Lipase: 18 U/L (ref 11–51)

## 2015-08-14 LAB — URINE MICROSCOPIC-ADD ON

## 2015-08-14 LAB — AMYLASE: Amylase: 54 U/L (ref 28–100)

## 2015-08-14 MED ORDER — LACTATED RINGERS IV BOLUS (SEPSIS)
1000.0000 mL | Freq: Once | INTRAVENOUS | Status: AC
  Administered 2015-08-14: 1000 mL via INTRAVENOUS

## 2015-08-14 MED ORDER — PROMETHAZINE HCL 25 MG RE SUPP: 25.0000 mg | Freq: Four times a day (QID) | RECTAL | Status: DC | PRN

## 2015-08-14 MED ORDER — DOXYLAMINE-PYRIDOXINE 10-10 MG PO TBEC: DELAYED_RELEASE_TABLET | ORAL | Status: DC

## 2015-08-14 MED ORDER — GLYCOPYRROLATE 0.2 MG/ML IJ SOLN
0.1000 mg | Freq: Once | INTRAMUSCULAR | Status: AC
  Administered 2015-08-14: 0.1 mg via INTRAVENOUS
  Filled 2015-08-14: qty 0.5

## 2015-08-14 MED ORDER — DEXTROSE 5 % IN LACTATED RINGERS IV BOLUS
1000.0000 mL | Freq: Once | INTRAVENOUS | Status: AC
  Administered 2015-08-14: 1000 mL via INTRAVENOUS

## 2015-08-14 MED ORDER — PROMETHAZINE HCL 25 MG/ML IJ SOLN
25.0000 mg | Freq: Once | INTRAMUSCULAR | Status: AC
  Administered 2015-08-14: 25 mg via INTRAVENOUS
  Filled 2015-08-14: qty 1

## 2015-08-14 NOTE — Discharge Instructions (Signed)
Morning Sickness °Morning sickness is when you feel sick to your stomach (nauseous) during pregnancy. This nauseous feeling may or may not come with vomiting. It often occurs in the morning but can be a problem any time of day. Morning sickness is most common during the first trimester, but it may continue throughout pregnancy. While morning sickness is unpleasant, it is usually harmless unless you develop severe and continual vomiting (hyperemesis gravidarum). This condition requires more intense treatment.  °CAUSES  °The cause of morning sickness is not completely known but seems to be related to normal hormonal changes that occur in pregnancy. °RISK FACTORS °You are at greater risk if you: °· Experienced nausea or vomiting before your pregnancy. °· Had morning sickness during a previous pregnancy. °· Are pregnant with more than one baby, such as twins. °TREATMENT  °Do not use any medicines (prescription, over-the-counter, or herbal) for morning sickness without first talking to your health care provider. Your health care provider may prescribe or recommend: °· Vitamin B6 supplements. °· Anti-nausea medicines. °· The herbal medicine ginger. °HOME CARE INSTRUCTIONS  °· Only take over-the-counter or prescription medicines as directed by your health care provider. °· Taking multivitamins before getting pregnant can prevent or decrease the severity of morning sickness in most women. °· Eat a piece of dry toast or unsalted crackers before getting out of bed in the morning. °· Eat five or six small meals a day. °· Eat dry and bland foods (rice, baked potato). Foods high in carbohydrates are often helpful. °· Do not drink liquids with your meals. Drink liquids between meals. °· Avoid greasy, fatty, and spicy foods. °· Get someone to cook for you if the smell of any food causes nausea and vomiting. °· If you feel nauseous after taking prenatal vitamins, take the vitamins at night or with a snack.  °· Snack on protein  foods (nuts, yogurt, cheese) between meals if you are hungry. °· Eat unsweetened gelatins for desserts. °· Wearing an acupressure wristband (worn for sea sickness) may be helpful. °· Acupuncture may be helpful. °· Do not smoke. °· Get a humidifier to keep the air in your house free of odors. °· Get plenty of fresh air. °SEEK MEDICAL CARE IF:  °· Your home remedies are not working, and you need medicine. °· You feel dizzy or lightheaded. °· You are losing weight. °SEEK IMMEDIATE MEDICAL CARE IF:  °· You have persistent and uncontrolled nausea and vomiting. °· You pass out (faint). °MAKE SURE YOU: °· Understand these instructions. °· Will watch your condition. °· Will get help right away if you are not doing well or get worse. °  °This information is not intended to replace advice given to you by your health care provider. Make sure you discuss any questions you have with your health care provider. °  °Document Released: 09/24/2006 Document Revised: 08/08/2013 Document Reviewed: 01/18/2013 °Elsevier Interactive Patient Education ©2016 [employer] ° ° ° °Eating Plan for Hyperemesis Gravidarum °Severe cases of hyperemesis gravidarum can lead to dehydration and malnutrition. The hyperemesis eating plan is one way to lessen the symptoms of nausea and vomiting. It is often used with prescribed medicines to control your symptoms.  °WHAT CAN I DO TO RELIEVE MY SYMPTOMS? °Listen to your body. Everyone is different and has different preferences. Find what works best for you. Some of the following things may help: °· Eat and drink slowly. °· Eat 5-6 small meals daily instead of 3 large meals.   °· Eat crackers before you   get out of bed in the morning.   °· Starchy foods are usually well tolerated (such as cereal, toast, bread, potatoes, pasta, rice, and pretzels).   °· Ginger may help with nausea. Add ¼ tsp ground ginger to hot tea or choose ginger tea.   °· Try drinking 100% fruit juice or an electrolyte drink. °· Continue  to take your prenatal vitamins as directed by your health care provider. If you are having trouble taking your prenatal vitamins, talk with your health care provider about different options. °· Include at least 1 serving of protein with your meals and snacks (such as meats or poultry, beans, nuts, eggs, or yogurt). Try eating a protein-rich snack before bed (such as cheese and crackers or a half turkey or peanut butter sandwich). °WHAT THINGS SHOULD I AVOID TO REDUCE MY SYMPTOMS? °The following things may help reduce your symptoms: °· Avoid foods with strong smells. Try eating meals in well-ventilated areas that are free of odors. °· Avoid drinking water or other beverages with meals. Try not to drink anything less than 30 minutes before and after meals. °· Avoid drinking more than 1 cup of fluid at a time. °· Avoid fried or high-fat foods, such as butter and cream sauces. °· Avoid spicy foods. °· Avoid skipping meals the best you can. Nausea can be more intense on an empty stomach. If you cannot tolerate food at that time, do not force it. Try sucking on ice chips or other frozen items and make up the calories later. °· Avoid lying down within 2 hours after eating. °  °This information is not intended to replace advice given to you by your health care provider. Make sure you discuss any questions you have with your health care provider. °  °Document Released: 05/31/2007 Document Revised: 08/08/2013 Document Reviewed: 06/07/2013 °Elsevier Interactive Patient Education ©2016 [employer] ° °

## 2015-08-14 NOTE — MAU Note (Signed)
Pt reports nausea and vomiting x 4-5 days. Unable to keep anything down and meds are not helping.

## 2015-08-14 NOTE — Progress Notes (Signed)
Unable to auscultate. NP aware

## 2015-08-14 NOTE — MAU Provider Note (Signed)
History     CSN: [id number]  Arrival date and time: 08/14/15 1850   First Provider Initiated Contact with Patient 08/14/15 2019      Chief Complaint  Patient presents with  . Emesis During Pregnancy   HPI Joyce Rice is a [age] y.o. [id number] at 2416w0d who presents with n/v. Has been taking phenergan & reglan at home with minimal relief. Last took phenergan 3 days ago. States vomited countless times today and has trouble keeping down food.  Denies abdominal pain, vaginal bleeding, or heartburn.   OB History    Gravida Para Term Preterm AB TAB SAB Ectopic Multiple Living   4 1 1  0 2 2 0 0 0 1      Past Medical History  Diagnosis Date  . Bartholin cyst   . Irregular periods/menstrual cycles   . Chlamydia   . Headache   . Infection     UTI    Past Surgical History  Procedure Laterality Date  . Dilation and curettage of uterus    . Induced abortion      Family History  Problem Relation Age of Onset  . Hearing loss Neg Hx     Social History  Substance Use Topics  . Smoking status: Never Smoker   . Smokeless tobacco: Never Used  . Alcohol Use: Yes     Comment: once or twice a year    Allergies: No Known Allergies  Prescriptions prior to admission  Medication Sig Dispense Refill Last Dose  . metoCLOPramide (REGLAN) 10 MG tablet Take 1 tablet (10 mg total) by mouth 3 (three) times daily before meals. 30 tablet 0 08/14/2015 at Unknown time  . promethazine (PHENERGAN) 12.5 MG tablet Take 1 tablet (12.5 mg total) by mouth at bedtime as needed for nausea or vomiting. 30 tablet 0 Past Week at Unknown time    Review of Systems  Constitutional: Negative.   Gastrointestinal: Positive for nausea and vomiting. Negative for abdominal pain, diarrhea and constipation.  Genitourinary: Negative.    Physical Exam   Blood pressure 120/78, pulse 72, temperature 98.1 F (36.7 C), temperature source Oral, resp. rate 16, height 5' 1.5" (1.562 m), weight 138 lb (62.596 kg), last  menstrual period 06/05/2015, SpO2 100 %.  Physical Exam  Nursing note and vitals reviewed. Constitutional: She is oriented to person, place, and time. She appears well-developed and well-nourished. No distress.  HENT:  Head: Normocephalic and atraumatic.  Mouth/Throat: Mucous membranes are pale and dry.  Eyes: Conjunctivae are normal. Right eye exhibits no discharge. Left eye exhibits no discharge. No scleral icterus.  Neck: Normal range of motion.  Cardiovascular: Normal rate, regular rhythm and normal heart sounds.   No murmur heard. Respiratory: Effort normal and breath sounds normal. No respiratory distress. She has no wheezes.  GI: Soft. Bowel sounds are normal. She exhibits no distension. There is no tenderness.  Neurological: She is alert and oriented to person, place, and time.  Skin: Skin is warm and dry. She is not diaphoretic.  Psychiatric: She has a normal mood and affect. Her behavior is normal. Judgment and thought content normal.    MAU Course  Procedures Results for orders placed or performed during the hospital encounter of 08/14/15 (from the past 24 hour(s))  Urinalysis, Routine w reflex microscopic (not at [facility])     Status: Abnormal   Collection Time: 08/14/15  7:20 PM  Result Value Ref Range   Color, Urine YELLOW YELLOW   APPearance CLEAR CLEAR   Specific  Gravity, Urine >1.030 (H) 1.005 - 1.030   pH 6.0 5.0 - 8.0   Glucose, UA NEGATIVE NEGATIVE mg/dL   Hgb urine dipstick MODERATE (A) NEGATIVE   Bilirubin Urine SMALL (A) NEGATIVE   Ketones, ur >80 (A) NEGATIVE mg/dL   Protein, ur 30 (A) NEGATIVE mg/dL   Nitrite NEGATIVE NEGATIVE   Leukocytes, UA NEGATIVE NEGATIVE  Urine microscopic-add on     Status: Abnormal   Collection Time: 08/14/15  7:20 PM  Result Value Ref Range   Squamous Epithelial / LPF 0-5 (A) NONE SEEN   WBC, UA 0-5 0 - 5 WBC/hpf   RBC / HPF 0-5 0 - 5 RBC/hpf   Bacteria, UA FEW (A) NONE SEEN   Urine-Other MUCOUS PRESENT   CBC     Status: None    Collection Time: 08/14/15  8:30 PM  Result Value Ref Range   WBC 7.4 4.0 - 10.5 K/uL   RBC 4.26 3.87 - 5.11 MIL/uL   Hemoglobin 12.9 12.0 - 15.0 g/dL   HCT 16.1 09.6 - 04.5 %   MCV 87.1 78.0 - 100.0 fL   MCH 30.3 26.0 - 34.0 pg   MCHC 34.8 30.0 - 36.0 g/dL   RDW 40.9 81.1 - 91.4 %   Platelets 242 150 - 400 K/uL  Comprehensive metabolic panel     Status: Abnormal   Collection Time: 08/14/15  8:30 PM  Result Value Ref Range   Sodium 137 135 - 145 mmol/L   Potassium 3.7 3.5 - 5.1 mmol/L   Chloride 103 101 - 111 mmol/L   CO2 21 (L) 22 - 32 mmol/L   Glucose, Bld 78 65 - 99 mg/dL   BUN 17 6 - 20 mg/dL   Creatinine, Ser 7.82 0.44 - 1.00 mg/dL   Calcium 95.6 8.9 - 21.3 mg/dL   Total Protein 7.9 6.5 - 8.1 g/dL   Albumin 4.6 3.5 - 5.0 g/dL   AST 19 15 - 41 U/L   ALT 14 14 - 54 U/L   Alkaline Phosphatase 50 38 - 126 U/L   Total Bilirubin 1.0 0.3 - 1.2 mg/dL   GFR calc non Af Amer >60 >60 mL/min   GFR calc Af Amer >60 >60 mL/min   Anion gap 13 5 - 15  Amylase     Status: None   Collection Time: 08/14/15  8:30 PM  Result Value Ref Range   Amylase 54 28 - 100 U/L  Lipase, blood     Status: None   Collection Time: 08/14/15  8:30 PM  Result Value Ref Range   Lipase 18 11 - 51 U/L    MDM Iv bolus of D5LR followed by LR.  IV phenergan & robinul given Pt not observed vomiting while in MAU  Assessment and Plan  A: 1. Vomiting pregnancy    P: Discharge home Rx phenergan suppositories & diclegis Start prenatal care asap Discussed reasons to return  Judeth Horn, NP  08/14/2015, 8:18 PM

## 2015-08-18 NOTE — L&D Delivery Note (Signed)
Delivery Note Acephalict 1:08 PM a viable female was delivered via Vaginal, Spontaneous Delivery (Presentation: cephalic).  APGAR: 9, 9; weight 7 lb 4.8 oz (3310 g).   Placenta status: manual extraction retrieved the placenta. Cord: 3 vessel   Anesthesia:  Epidural Episiotomy: None Lacerations: None Suture Repair: NA Est. Blood Loss (mL):   Mom to postpartum.  Baby to Couplet care / Skin to Skin.  Andres Ege, MD, PGY-1, MPH 03/10/2016, 2:40 PM

## 2015-10-01 ENCOUNTER — Ambulatory Visit (INDEPENDENT_AMBULATORY_CARE_PROVIDER_SITE_OTHER): Payer: Medicaid Other | Admitting: Obstetrics and Gynecology

## 2015-10-01 ENCOUNTER — Other Ambulatory Visit: Payer: Self-pay | Admitting: Obstetrics and Gynecology

## 2015-10-01 ENCOUNTER — Encounter: Payer: Self-pay | Admitting: Obstetrics and Gynecology

## 2015-10-01 ENCOUNTER — Telehealth: Payer: Self-pay | Admitting: *Deleted

## 2015-10-01 VITALS — BP 114/64 | HR 79 | Temp 99.5°F | Wt 138.9 lb

## 2015-10-01 DIAGNOSIS — Z3482 Encounter for supervision of other normal pregnancy, second trimester: Secondary | ICD-10-CM

## 2015-10-01 DIAGNOSIS — Z3480 Encounter for supervision of other normal pregnancy, unspecified trimester: Secondary | ICD-10-CM | POA: Insufficient documentation

## 2015-10-01 DIAGNOSIS — O219 Vomiting of pregnancy, unspecified: Secondary | ICD-10-CM

## 2015-10-01 DIAGNOSIS — Z6791 Unspecified blood type, Rh negative: Secondary | ICD-10-CM | POA: Insufficient documentation

## 2015-10-01 DIAGNOSIS — O26892 Other specified pregnancy related conditions, second trimester: Secondary | ICD-10-CM

## 2015-10-01 LAB — POCT URINALYSIS DIP (DEVICE)
Bilirubin Urine: NEGATIVE
GLUCOSE, UA: 100 mg/dL — AB
Hgb urine dipstick: NEGATIVE
KETONES UR: 15 mg/dL — AB
NITRITE: NEGATIVE
PROTEIN: 100 mg/dL — AB
UROBILINOGEN UA: 1 mg/dL (ref 0.0–1.0)
pH: 6 (ref 5.0–8.0)

## 2015-10-01 MED ORDER — PROMETHAZINE HCL 25 MG RE SUPP: 25.0000 mg | Freq: Four times a day (QID) | RECTAL | Status: DC | PRN

## 2015-10-01 MED ORDER — CONCEPT OB 130-92.4-1 MG PO CAPS: 1.0000 | ORAL_CAPSULE | ORAL | Status: DC

## 2015-10-01 NOTE — Progress Notes (Signed)
Subjective:    Joyce Rice is a [id number] [redacted]w[redacted]d being seen today for her first obstetrical visit.  Her obstetrical history is significant for nausea and vomiting and Rh negative. Patient does intend to breast feed. Pregnancy history fully reviewed. Works at [employer].  Patient reports persisten  vomiting, some improvement on Phenergan and did not fill rx's for other antiemetics. Had 2 MAU visits for IV rehydration. Does not fell ill enough to go MAU today. Retained a biscuit 1 hr ago.  Has RLP also.  GC/CT WP negative in MAU.  Not due for Pap.  Filed Vitals:   10/01/15 1305  BP: 114/64  Pulse: 79  Temp: 99.5 F (37.5 C)  Weight: 138 lb 14.4 oz (63.005 kg)    HISTORY: OB History  Gravida Para Term Preterm AB SAB TAB Ectopic Multiple Living  0 2 0 2 0 0 1    # Outcome Date GA Lbr Len/2nd Weight Sex Delivery Anes PTL Lv  4 Current           3 TAB 09/18/10          2 Term 11/21/08    F Vag-Spont        Comments: System Generated. Please review and update pregnancy details.  1 TAB              Past Medical History  Diagnosis Date  . Bartholin cyst   . Irregular periods/menstrual cycles   . Chlamydia   . Headache   . Infection     UTI  . Chlamydia 12/29/2011    Azithromycin given 12/29/11, vomited. Partner Tx per pt. Rx Doxy 01/29/12.    Past Surgical History  Procedure Laterality Date  . Dilation and curettage of uterus    . Induced abortion     Family History  Problem Relation Age of Onset  . Hearing loss Neg Hx      Exam    Uterus:   S=D  Pelvic Exam:    Perineum: No Hemorrhoids, Normal Perineum   Vulva: normal, Bartholin's, Urethra, Skene's normal   Vagina:  normal mucosa, normal discharge       Cervix: no lesions and no bleeding Thick/closed   Adnexa: not evaluated   Bony Pelvis: average  System: Breast:  normal appearance, no masses or tenderness, Inspection negative   Skin: normal coloration and turgor, no rashes    Neurologic:  oriented, normal, grossly non-focal   Extremities: normal strength, tone, and muscle mass   HEENT PERRLA, neck supple with midline trachea and thyroid without masses   Mouth/Teeth mucous membranes moist, pharynx normal without lesions and dental hygiene good   Neck supple   Cardiovascular: regular rate and rhythm, no murmurs or gallops   Respiratory:  appears well, vitals normal, no respiratory distress, acyanotic, normal RR, ear and throat exam is normal, neck free of mass or lymphadenopathy, chest clear, no wheezing, crepitations, rhonchi, normal symmetric air entry   Abdomen: soft, non-tender; bowel sounds normal; no masses,  no organomegaly   Urinary: urethral meatus normal   Retaining sips of Sprite while here   Assessment:    Pregnancy: [id number] Patient Active Problem List   Diagnosis Date Noted  . Rh negative status during pregnancy in second trimester, antepartum 10/01/2015  . Supervision of normal subsequent pregnancy 10/01/2015  . Nausea and vomiting during pregnancy prior to [redacted] weeks gestation 10/01/2015        Plan:     Initial labs  drawn. Prenatal vitamins. Problem list reviewed and updated. Genetic Screening discussed Quad Screen: declined.  Ultrasound discussed; fetal survey: requested.  Follow up in 4 weeks. 50% of 30 min visit spent on counseling and coordination of care.    Jafet Wissing 10/01/2015

## 2015-10-01 NOTE — Progress Notes (Signed)
Pt would like to have gummy prenatal vitamin called into the pharmacy . Pt declined flu at this time

## 2015-10-01 NOTE — Telephone Encounter (Signed)
Pt left message stating that she has a question for the nurse. Please call back.

## 2015-10-01 NOTE — Patient Instructions (Signed)

## 2015-10-01 NOTE — Progress Notes (Signed)
Large leuks on udip 

## 2015-10-04 LAB — PRESCRIPTION MONITORING PROFILE (19 PANEL)
Amphetamine/Meth: NEGATIVE ng/mL
BARBITURATE SCREEN, URINE: NEGATIVE ng/mL
BUPRENORPHINE, URINE: NEGATIVE ng/mL
Benzodiazepine Screen, Urine: NEGATIVE ng/mL
CANNABINOID SCRN UR: NEGATIVE ng/mL
Carisoprodol, Urine: NEGATIVE ng/mL
Cocaine Metabolites: NEGATIVE ng/mL
Creatinine, Urine: 398.19 mg/dL (ref >20.0)
Fentanyl, Ur: NEGATIVE ng/mL
MDMA URINE: NEGATIVE ng/mL
METHAQUALONE SCREEN (URINE): NEGATIVE ng/mL
Meperidine, Ur: NEGATIVE ng/mL
Methadone Screen, Urine: NEGATIVE ng/mL
Nitrites, Initial: NEGATIVE ug/mL
OPIATE SCREEN, URINE: NEGATIVE ng/mL
Oxycodone Screen, Ur: NEGATIVE ng/mL
PHENCYCLIDINE, UR: NEGATIVE ng/mL
Propoxyphene: NEGATIVE ng/mL
TAPENTADOLUR: NEGATIVE ng/mL
Tramadol Scrn, Ur: NEGATIVE ng/mL
ZOLPIDEM, URINE: NEGATIVE ng/mL
pH, Initial: 7.4 pH (ref 4.5–8.9)

## 2015-10-04 LAB — CULTURE, OB URINE

## 2015-10-04 NOTE — Telephone Encounter (Signed)
Attempted to reach patient with no answer. Pt has upcoming appt on 10/09/2015

## 2015-10-22 ENCOUNTER — Ambulatory Visit (HOSPITAL_COMMUNITY)
Admission: RE | Admit: 2015-10-22 | Discharge: 2015-10-22 | Disposition: A | Discharge status: Home / Self Care | Payer: Medicaid Other | Source: Ambulatory Visit | Attending: Obstetrics and Gynecology | Admitting: Obstetrics and Gynecology

## 2015-10-22 DIAGNOSIS — Z36 Encounter for antenatal screening of mother: Secondary | ICD-10-CM | POA: Insufficient documentation

## 2015-10-22 DIAGNOSIS — Z3482 Encounter for supervision of other normal pregnancy, second trimester: Secondary | ICD-10-CM

## 2015-10-22 DIAGNOSIS — Z3A19 19 weeks gestation of pregnancy: Secondary | ICD-10-CM | POA: Diagnosis not present

## 2015-10-29 ENCOUNTER — Encounter: Payer: Medicaid Other | Admitting: Advanced Practice Midwife

## 2015-11-11 ENCOUNTER — Emergency Department (INDEPENDENT_AMBULATORY_CARE_PROVIDER_SITE_OTHER)
Admission: EM | Admit: 2015-11-11 | Discharge: 2015-11-11 | Disposition: A | Discharge status: Home / Self Care | Payer: Medicaid Other | Source: Home / Self Care | Attending: Family Medicine | Admitting: Family Medicine

## 2015-11-11 ENCOUNTER — Encounter (HOSPITAL_COMMUNITY): Payer: Self-pay

## 2015-11-11 DIAGNOSIS — J069 Acute upper respiratory infection, unspecified: Secondary | ICD-10-CM | POA: Diagnosis not present

## 2015-11-11 MED ORDER — AMOXICILLIN 250 MG PO CAPS: 250.0000 mg | ORAL_CAPSULE | Freq: Three times a day (TID) | ORAL | Status: DC

## 2015-11-11 NOTE — Discharge Instructions (Signed)
Upper Respiratory Infection, Adult °Most upper respiratory infections (URIs) are a viral infection of the air passages leading to the lungs. A URI affects the nose, throat, and upper air passages. The most common type of URI is nasopharyngitis and is typically referred to as "the common cold." °URIs run their course and usually go away on their own. Most of the time, a URI does not require medical attention, but sometimes a bacterial infection in the upper airways can follow a viral infection. This is called a secondary infection. Sinus and middle ear infections are common types of secondary upper respiratory infections. °Bacterial pneumonia can also complicate a URI. A URI can worsen asthma and chronic obstructive pulmonary disease (COPD). Sometimes, these complications can require emergency medical care and may be life threatening.  °CAUSES °Almost all URIs are caused by viruses. A virus is a type of germ and can spread from one person to another.  °RISKS FACTORS °You may be at risk for a URI if:  °· You smoke.   °· You have chronic heart or lung disease. °· You have a weakened defense (immune) system.   °· You are very young or very old.   °· You have nasal allergies or asthma. °· You work in crowded or poorly ventilated areas. °· You work in health care facilities or schools. °SIGNS AND SYMPTOMS  °Symptoms typically develop 2-3 days after you come in contact with a cold virus. Most viral URIs last 7-10 days. However, viral URIs from the influenza virus (flu virus) can last 14-18 days and are typically more severe. Symptoms may include:  °· Runny or stuffy (congested) nose.   °· Sneezing.   °· Cough.   °· Sore throat.   °· Headache.   °· Fatigue.   °· Fever.   °· Loss of appetite.   °· Pain in your forehead, behind your eyes, and over your cheekbones (sinus pain). °· Muscle aches.   °DIAGNOSIS  °Your health care provider may diagnose a URI by: °· Physical exam. °· Tests to check that your symptoms are not due to  another condition such as: °· Strep throat. °· Sinusitis. °· Pneumonia. °· Asthma. °TREATMENT  °A URI goes away on its own with time. It cannot be cured with medicines, but medicines may be prescribed or recommended to relieve symptoms. Medicines may help: °· Reduce your fever. °· Reduce your cough. °· Relieve nasal congestion. °HOME CARE INSTRUCTIONS  °· Take medicines only as directed by your health care provider.   °· Gargle warm saltwater or take cough drops to comfort your throat as directed by your health care provider. °· Use a warm mist humidifier or inhale steam from a shower to increase air moisture. This may make it easier to breathe. °· Drink enough fluid to keep your urine clear or pale yellow.   °· Eat soups and other clear broths and maintain good nutrition.   °· Rest as needed.   °· Return to work when your temperature has returned to normal or as your health care provider advises. You may need to stay home longer to avoid infecting others. You can also use a face mask and careful hand washing to prevent spread of the virus. °· Increase the usage of your inhaler if you have asthma.   °· Do not use any tobacco products, including cigarettes, chewing tobacco, or electronic cigarettes. If you need help quitting, ask your health care provider. °PREVENTION  °The best way to protect yourself from getting a cold is to practice good hygiene.  °· Avoid oral or hand contact with people with cold   symptoms.   °· Wash your hands often if contact occurs.   °There is no clear evidence that vitamin C, vitamin E, echinacea, or exercise reduces the chance of developing a cold. However, it is always recommended to get plenty of rest, exercise, and practice good nutrition.  °SEEK MEDICAL CARE IF:  °· You are getting worse rather than better.   °· Your symptoms are not controlled by medicine.   °· You have chills. °· You have worsening shortness of breath. °· You have brown or red mucus. °· You have yellow or brown nasal  discharge. °· You have pain in your face, especially when you bend forward. °· You have a fever. °· You have swollen neck glands. °· You have pain while swallowing. °· You have white areas in the back of your throat. °SEEK IMMEDIATE MEDICAL CARE IF:  °· You have severe or persistent: °¨ Headache. °¨ Ear pain. °¨ Sinus pain. °¨ Chest pain. °· You have chronic lung disease and any of the following: °¨ Wheezing. °¨ Prolonged cough. °¨ Coughing up blood. °¨ A change in your usual mucus. °· You have a stiff neck. °· You have changes in your: °¨ Vision. °¨ Hearing. °¨ Thinking. °¨ Mood. °MAKE SURE YOU:  °· Understand these instructions. °· Will watch your condition. °· Will get help right away if you are not doing well or get worse. °  °This information is not intended to replace advice given to you by your health care provider. Make sure you discuss any questions you have with your health care provider. °  °Document Released: 01/27/2001 Document Revised: 12/18/2014 Document Reviewed: 11/08/2013 °Elsevier Interactive Patient Education ©2016 Elsevier Inc. ° °Cough, Adult °A cough helps to clear your throat and lungs. A cough may last only 2-3 weeks (acute), or it may last longer than 8 weeks (chronic). Many different things can cause a cough. A cough may be a sign of an illness or another medical condition. °HOME CARE °· Pay attention to any changes in your cough. °· Take medicines only as told by your doctor. °¨ If you were prescribed an antibiotic medicine, take it as told by your doctor. Do not stop taking it even if you start to feel better. °¨ Talk with your doctor before you try using a cough medicine. °· Drink enough fluid to keep your pee (urine) clear or pale yellow. °· If the air is dry, use a cold steam vaporizer or humidifier in your home. °· Stay away from things that make you cough at work or at home. °· If your cough is worse at night, try using extra pillows to raise your head up higher while you  sleep. °· Do not smoke, and try not to be around smoke. If you need help quitting, ask your doctor. °· Do not have caffeine. °· Do not drink alcohol. °· Rest as needed. °GET HELP IF: °· You have new problems (symptoms). °· You cough up yellow fluid (pus). °· Your cough does not get better after 2-3 weeks, or your cough gets worse. °· Medicine does not help your cough and you are not sleeping well. °· You have pain that gets worse or pain that is not helped with medicine. °· You have a fever. °· You are losing weight and you do not know why. °· You have night sweats. °GET HELP RIGHT AWAY IF: °· You cough up blood. °· You have trouble breathing. °· Your heartbeat is very fast. °  °This information is not intended to replace   advice given to you by your health care provider. Make sure you discuss any questions you have with your health care provider. °  °Document Released: 04/16/2011 Document Revised: 04/24/2015 Document Reviewed: 10/10/2014 °Elsevier Interactive Patient Education ©2016 Elsevier Inc. ° °

## 2015-11-11 NOTE — ED Notes (Signed)
Patient complains of having a cough and congestions for a little over a week Patient has been using some OTC medications with little relief Patient is currently six months pregnant as well

## 2015-11-12 NOTE — ED Provider Notes (Signed)
CSN: [id number]     Arrival date & time 11/11/15  1742 History   First MD Initiated Contact with Patient 11/11/15 1937     Chief Complaint  Patient presents with  . Cough   (Consider location/radiation/quality/duration/timing/severity/associated sxs/prior Treatment) HPI Cold symptoms for 2 days, runny nose, congestion, cough. Some nasal drainage that is green. OTC with minimal relief of symptoms. NO fever.  Past Medical History  Diagnosis Date  . Bartholin cyst   . Irregular periods/menstrual cycles   . Chlamydia   . Headache   . Infection     UTI  . Chlamydia 12/29/2011    Azithromycin given 12/29/11, vomited. Partner Tx per pt. Rx Doxy 01/29/12.    Past Surgical History  Procedure Laterality Date  . Dilation and curettage of uterus    . Induced abortion     Family History  Problem Relation Age of Onset  . Hearing loss Neg Hx    Social History  Substance Use Topics  . Smoking status: Never Smoker   . Smokeless tobacco: Never Used  . Alcohol Use: Yes     Comment: once or twice a year   OB History    Gravida Para Term Preterm AB TAB SAB Ectopic Multiple Living   0 2 2 0 0 0 1     Review of Systems Cold symptoms Allergies  Review of patient's allergies indicates no known allergies.  Home Medications   Prior to Admission medications   Medication Sig Start Date End Date Taking? Authorizing Provider  amoxicillin (AMOXIL) 250 MG capsule Take 1 capsule (250 mg total) by mouth 3 (three) times daily. 11/11/15   Tharon Aquas, PA  Doxylamine-Pyridoxine (DICLEGIS) 10-10 MG TBEC Start with 2 tablets every evening. If symptoms persist, add 1 tablet every morning. If symptoms persist, add 1 tablet at mid-day. Patient not taking: Reported on 10/01/2015 08/14/15   Judeth Horn, NP  metoCLOPramide (REGLAN) 10 MG tablet Take 1 tablet (10 mg total) by mouth 3 (three) times daily before meals. 08/05/15   Elta Guadeloupe, NP  Prenat w/o A Vit-FeFum-FePo-FA (CONCEPT OB) 130-92.4-1  MG CAPS Take 1 tablet by mouth 1 day or 1 dose. 10/01/15   Deirdre Colin Mulders, CNM  promethazine (PHENERGAN) 12.5 MG tablet Take 1 tablet (12.5 mg total) by mouth at bedtime as needed for nausea or vomiting. 08/05/15   Elta Guadeloupe, NP  promethazine (PHENERGAN) 25 MG suppository Place 1 suppository (25 mg total) rectally every 6 (six) hours as needed for nausea or vomiting. 10/01/15   Deirdre Colin Mulders, CNM   Meds Ordered and Administered this Visit  Medications - No data to display  BP 109/58 mmHg  Pulse 77  Temp(Src) 97.9 F (36.6 C) (Oral)  Resp 16  SpO2 98%  LMP 06/05/2015 No data found.   Physical Exam NURSES NOTES AND VITAL SIGNS REVIEWED. CONSTITUTIONAL: Well developed, well nourished, no acute distress HEENT: normocephalic, atraumatic, right and left TM's are normal EYES: Conjunctiva normal NECK:normal ROM, supple, no adenopathy PULMONARY:No respiratory distress, normal effort, Lungs: CTAb/l, no wheezes, or increased work of breathing CARDIOVASCULAR: RRR, no murmur ABDOMEN: soft, ND, NT, +'ve BS MUSCULOSKELETAL: Normal ROM of all extremities,  SKIN: warm and dry without rash PSYCHIATRIC: Mood and affect, behavior are normal  ED Course  Procedures (including critical care time)  Labs Review Labs Reviewed - No data to display  Imaging Review No results found.   Visual Acuity Review  Right Eye Distance:  Left Eye Distance:   Bilateral Distance:    Right Eye Near:   Left Eye Near:    Bilateral Near:         MDM   1. URI (upper respiratory infection)     Patient is reassured that there are no issues that require transfer to higher level of care at this time or additional tests. Patient is advised to continue home symptomatic treatment. Patient is advised that if there are new or worsening symptoms to attend the emergency department, contact primary care provider, or return to UC. Instructions of care provided discharged home in stable condition. Return to  work/school note provided.   THIS NOTE WAS GENERATED USING A VOICE RECOGNITION SOFTWARE PROGRAM. ALL REASONABLE EFFORTS  WERE MADE TO PROOFREAD THIS DOCUMENT FOR ACCURACY.  I have verbally reviewed the discharge instructions with the patient. A printed AVS was given to the patient.  All questions were answered prior to discharge.      Tharon AquasFrank C Kyilee Gregg, [place] 11/12/15 [phone]

## 2015-11-20 ENCOUNTER — Ambulatory Visit (INDEPENDENT_AMBULATORY_CARE_PROVIDER_SITE_OTHER): Payer: Medicaid Other | Admitting: Advanced Practice Midwife

## 2015-11-20 ENCOUNTER — Encounter: Payer: Self-pay | Admitting: Advanced Practice Midwife

## 2015-11-20 VITALS — BP 107/61 | HR 70 | Temp 98.3°F | Wt 149.0 lb

## 2015-11-20 DIAGNOSIS — Z3482 Encounter for supervision of other normal pregnancy, second trimester: Secondary | ICD-10-CM

## 2015-11-20 DIAGNOSIS — Z3492 Encounter for supervision of normal pregnancy, unspecified, second trimester: Secondary | ICD-10-CM | POA: Diagnosis present

## 2015-11-20 LAB — POCT URINALYSIS DIP (DEVICE)
Bilirubin Urine: NEGATIVE
GLUCOSE, UA: NEGATIVE mg/dL
Hgb urine dipstick: NEGATIVE
KETONES UR: NEGATIVE mg/dL
NITRITE: NEGATIVE
PROTEIN: 30 mg/dL — AB
Specific Gravity, Urine: >=1.03 (ref 1.005–1.030)
UROBILINOGEN UA: 1 mg/dL (ref 0.0–1.0)
pH: 6 (ref 5.0–8.0)

## 2015-11-20 NOTE — Patient Instructions (Signed)

## 2015-11-20 NOTE — Progress Notes (Signed)
Pt states she only took 2 days worth of Amoxicillin for URI because it made her sick. She still struggles with frequent nausea and states that no medication helps.  Pt needs new Ob labs - not done @ initial visit.

## 2015-11-20 NOTE — Progress Notes (Signed)
Subjective:  Joyce Rice is a 30 y.o. G4P1021 at 2840w0d being seen today for ongoing prenatal care.  She is currently monitored for the following issues for this low-risk pregnancy and has Rh negative status during pregnancy in second trimester, antepartum; Supervision of normal subsequent pregnancy; and Nausea and vomiting during pregnancy prior to [redacted] weeks gestation on her problem list.  Patient reports nausea.  Contractions: Not present. Vag. Bleeding: None.  Movement: Present. Denies leaking of fluid.   The following portions of the patient's history were reviewed and updated as appropriate: allergies, current medications, past family history, past medical history, past social history, past surgical history and problem list. Problem list updated.  Objective:   Filed Vitals:   11/20/15 1121  BP: 107/61  Pulse: 70  Temp: 98.3 F (36.8 C)  Weight: 149 lb (67.586 kg)    Fetal Status: Fetal Heart Rate (bpm): 140 Fundal Height: 25 cm Movement: Present     General:  Alert, oriented and cooperative. Patient is in no acute distress.  Skin: Skin is warm and dry. No rash noted.   Cardiovascular: Normal heart rate noted  Respiratory: Normal respiratory effort, no problems with respiration noted  Abdomen: Soft, gravid, appropriate for gestational age. Pain/Pressure: Present     Pelvic: Vag. Bleeding: None     Cervical exam deferred        Extremities: Normal range of motion.  Edema: None  Mental Status: Normal mood and affect. Normal behavior. Normal judgment and thought content.   Urinalysis: Urine Protein: 1+ Urine Glucose: Negative  Assessment and Plan:  Pregnancy: G4P1021 at 2840w0d  1. Supervision of normal pregnancy, second trimester     New OB labs done - Prenatal Profile - Prescript Monitor Profile(19) - Hemoglobinopathy evaluation  2. Encounter for supervision of other normal pregnancy in second trimester      Still having some nausea. States meds don't work.       CWHbox  updated   Preterm labor symptoms and general obstetric precautions including but not limited to vaginal bleeding, contractions, leaking of fluid and fetal movement were reviewed in detail with the patient. Please refer to After Visit Summary for other counseling recommendations.  Return in about 4 weeks (around 12/18/2015) for [facility].   Aviva SignsMarie L Williams, CNM

## 2015-11-22 LAB — HEMOGLOBINOPATHY EVALUATION
HGB A: 96.8 % (ref 96.8–97.8)
HGB S QUANTITAION: 0 %
Hemoglobin Other: 0 %
Hgb A2 Quant: 3.2 % (ref 2.2–3.2)
Hgb F Quant: 0 % (ref 0.0–2.0)

## 2015-11-22 LAB — PRENATAL PROFILE (SOLSTAS)
ANTIBODY SCREEN: NEGATIVE
BASOS ABS: 0 {cells}/uL (ref 0–200)
Basophils Relative: 0 %
EOS ABS: 94 {cells}/uL (ref 15–500)
Eosinophils Relative: 1 %
HCT: 33 % — ABNORMAL LOW (ref 35.0–45.0)
HIV: NONREACTIVE
Hemoglobin: 10.9 g/dL — ABNORMAL LOW (ref 11.7–15.5)
Hepatitis B Surface Ag: NEGATIVE
LYMPHS PCT: 24 %
Lymphs Abs: 2256 cells/uL (ref 850–3900)
MCH: 29.9 pg (ref 27.0–33.0)
MCHC: 33 g/dL (ref 32.0–36.0)
MCV: 90.7 fL (ref 80.0–100.0)
MONOS PCT: 9 %
MPV: 11.2 fL (ref 7.5–12.5)
Monocytes Absolute: 846 cells/uL (ref 200–950)
Neutro Abs: 6204 cells/uL (ref 1500–7800)
Neutrophils Relative %: 66 %
PLATELETS: 245 10*3/uL (ref 140–400)
RBC: 3.64 MIL/uL — ABNORMAL LOW (ref 3.80–5.10)
RDW: 13.1 % (ref 11.0–15.0)
RH TYPE: NEGATIVE
RUBELLA: 3.37 {index} — AB (ref <0.90)
WBC: 9.4 10*3/uL (ref 3.8–10.8)

## 2015-11-22 LAB — PRESCRIPTION MONITORING PROFILE (19 PANEL)
AMPHETAMINE/METH: NEGATIVE ng/mL
Barbiturate Screen, Urine: NEGATIVE ng/mL
Benzodiazepine Screen, Urine: NEGATIVE ng/mL
Buprenorphine, Urine: NEGATIVE ng/mL
CANNABINOID SCRN UR: NEGATIVE ng/mL
CARISOPRODOL, URINE: NEGATIVE ng/mL
Cocaine Metabolites: NEGATIVE ng/mL
Creatinine, Urine: 231.35 mg/dL (ref >20.0)
Fentanyl, Ur: NEGATIVE ng/mL
MDMA URINE: NEGATIVE ng/mL
MEPERIDINE UR: NEGATIVE ng/mL
METHADONE SCREEN, URINE: NEGATIVE ng/mL
Methaqualone: NEGATIVE ng/mL
NITRITES URINE, INITIAL: NEGATIVE ug/mL
OXYCODONE SCRN UR: NEGATIVE ng/mL
Opiate Screen, Urine: NEGATIVE ng/mL
PROPOXYPHENE: NEGATIVE ng/mL
Phencyclidine, Ur: NEGATIVE ng/mL
TAPENTADOLUR: NEGATIVE ng/mL
TRAMADOL UR: NEGATIVE ng/mL
Zolpidem, Urine: NEGATIVE ng/mL
pH, Initial: 6.1 pH (ref 4.5–8.9)

## 2015-12-18 ENCOUNTER — Encounter: Payer: Medicaid Other | Admitting: Advanced Practice Midwife

## 2015-12-28 ENCOUNTER — Inpatient Hospital Stay (HOSPITAL_COMMUNITY)
Admission: AD | Admit: 2015-12-28 | Discharge: 2015-12-28 | Disposition: A | Discharge status: Home / Self Care | Payer: Medicaid Other | Source: Ambulatory Visit | Attending: Obstetrics & Gynecology | Admitting: Obstetrics & Gynecology

## 2015-12-28 ENCOUNTER — Encounter (HOSPITAL_COMMUNITY): Payer: Self-pay | Admitting: Certified Nurse Midwife

## 2015-12-28 DIAGNOSIS — Z3A29 29 weeks gestation of pregnancy: Secondary | ICD-10-CM | POA: Insufficient documentation

## 2015-12-28 DIAGNOSIS — O26899 Other specified pregnancy related conditions, unspecified trimester: Secondary | ICD-10-CM

## 2015-12-28 DIAGNOSIS — O26893 Other specified pregnancy related conditions, third trimester: Secondary | ICD-10-CM | POA: Diagnosis not present

## 2015-12-28 DIAGNOSIS — R102 Pelvic and perineal pain: Secondary | ICD-10-CM | POA: Diagnosis not present

## 2015-12-28 DIAGNOSIS — R109 Unspecified abdominal pain: Secondary | ICD-10-CM | POA: Insufficient documentation

## 2015-12-28 DIAGNOSIS — O9989 Other specified diseases and conditions complicating pregnancy, childbirth and the puerperium: Secondary | ICD-10-CM

## 2015-12-28 LAB — URINALYSIS, ROUTINE W REFLEX MICROSCOPIC
Bilirubin Urine: NEGATIVE
Glucose, UA: NEGATIVE mg/dL
HGB URINE DIPSTICK: NEGATIVE
Ketones, ur: 40 mg/dL — AB
Nitrite: NEGATIVE
PH: 5.5 (ref 5.0–8.0)
Protein, ur: NEGATIVE mg/dL

## 2015-12-28 LAB — WET PREP, GENITAL
Clue Cells Wet Prep HPF POC: NONE SEEN
Sperm: NONE SEEN
Trich, Wet Prep: NONE SEEN
YEAST WET PREP: NONE SEEN

## 2015-12-28 LAB — URINE MICROSCOPIC-ADD ON

## 2015-12-28 NOTE — MAU Provider Note (Signed)
MAU HISTORY AND PHYSICAL  Chief Complaint:  Contractions   Joyce Rice is a 30 y.o.  [id number] with IUP at 4734w3d presenting for Contractions  Last night ate pineapple and shortly after abdominal pain low abdominal pain, constant. Had urge to defecate, was relieved somewhat, but pain continued. Now coming and going. No bleeding, no LOF. No vomiting, mild nausea unchanged from previously in pregnancy. No back pain.   Pain does not radiate. It is mostly midline, doesn't favor one side over another.   Past Medical History  Diagnosis Date  . Bartholin cyst   . Irregular periods/menstrual cycles   . Chlamydia   . Headache   . Infection     UTI  . Chlamydia 12/29/2011    Azithromycin given 12/29/11, vomited. Partner Tx per pt. Rx Doxy 01/29/12.     Past Surgical History  Procedure Laterality Date  . Dilation and curettage of uterus    . Induced abortion      Family History  Problem Relation Age of Onset  . Hearing loss Neg Hx     Social History  Substance Use Topics  . Smoking status: Never Smoker   . Smokeless tobacco: Never Used  . Alcohol Use: No     Comment: once or twice a year    No Known Allergies  Prescriptions prior to admission  Medication Sig Dispense Refill Last Dose  . Prenat w/o A Vit-FeFum-FePo-FA (CONCEPT OB) 130-92.4-1 MG CAPS Take 1 tablet by mouth 1 day or 1 dose. (Patient taking differently: Take 1 tablet by mouth daily. ) 30 capsule 5 12/27/2015 at Unknown time  . amoxicillin (AMOXIL) 250 MG capsule Take 1 capsule (250 mg total) by mouth 3 (three) times daily. (Patient not taking: Reported on 11/20/2015) 21 capsule 0 Not Taking at Unknown time  . Doxylamine-Pyridoxine (DICLEGIS) 10-10 MG TBEC Start with 2 tablets every evening. If symptoms persist, add 1 tablet every morning. If symptoms persist, add 1 tablet at mid-day. (Patient not taking: Reported on 10/01/2015) 60 tablet 0 Not Taking at Unknown time  . promethazine (PHENERGAN) 25 MG suppository Place 1  suppository (25 mg total) rectally every 6 (six) hours as needed for nausea or vomiting. (Patient not taking: Reported on 11/20/2015) 12 each 0 Not Taking at Unknown time    Review of Systems - Negative except for what is mentioned in HPI.  Physical Exam  Blood pressure 108/72, pulse 98, temperature 98.3 F (36.8 C), temperature source Oral, resp. rate 20, height 5\' 3"  (1.6 m), weight 154 lb 6.4 oz (70.035 kg), last menstrual period 06/05/2015. GENERAL: Well-developed, well-nourished female in no acute distress.  LUNGS: Clear to auscultation bilaterally.  HEART: Regular rate and rhythm. ABDOMEN: Soft, nontender, nondistended, gravid.  EXTREMITIES: Nontender, no edema, 2+ distal pulses. Cervical Exam: closed/thick/high FHT:  140/mod/+a/-d Contractions: occasional   Labs: Results for orders placed or performed during the hospital encounter of 12/28/15 (from the past 24 hour(s))  Urinalysis, Routine w reflex microscopic [facility])   Collection Time: 12/28/15 11:38 AM  Result Value Ref Range   Color, Urine YELLOW YELLOW   APPearance HAZY (A) CLEAR   Specific Gravity, Urine >1.030 (H) 1.005 - 1.030   pH 5.5 5.0 - 8.0   Glucose, UA NEGATIVE NEGATIVE mg/dL   Hgb urine dipstick NEGATIVE NEGATIVE   Bilirubin Urine NEGATIVE NEGATIVE   Ketones, ur 40 (A) NEGATIVE mg/dL   Protein, ur NEGATIVE NEGATIVE mg/dL   Nitrite NEGATIVE NEGATIVE   Leukocytes, UA SMALL (A) NEGATIVE  Urine microscopic-add on   Collection Time: 12/28/15 11:38 AM  Result Value Ref Range   Squamous Epithelial / LPF 6-30 (A) NONE SEEN   WBC, UA 6-30 0 - 5 WBC/hpf   RBC / HPF 0-5 0 - 5 RBC/hpf   Bacteria, UA MANY (A) NONE SEEN  Wet prep, genital   Collection Time: 12/28/15 12:40 PM  Result Value Ref Range   Yeast Wet Prep HPF POC NONE SEEN NONE SEEN   Trich, Wet Prep NONE SEEN NONE SEEN   Clue Cells Wet Prep HPF POC NONE SEEN NONE SEEN   WBC, Wet Prep HPF POC MODERATE (A) NONE SEEN   Sperm NONE SEEN     Imaging  Studies:  No results found.  Assessment: Joyce Rice is  30 y.o. [phone] at [redacted]w[redacted]d presents with midline lower abdominal/pelvic pain. Does not appear to be in distress, occasional contraction captured on monitor, NST reactive, cervix closed and long - do not think this preterm labor. No uterine tenderness or vaginal bleeding to suggest abruption. No signs pprom. No constitutional symptoms to suggest infection - ua not suggestive of uti, wet prep negative, g/c pending. Is not constipated. No upper abdominal or right-sided pain to suggest gallbladder, pancreas, liver, or appendix-related process. Does not favor one side over the other so think adnexal pathology unlikely. Etiology remains unclear but patient well-appearing, tolerating PO.  Plan: - d/c home with ptl, pprom, abruption, and abdominal pain return precautions - ob f/u next week, set to receive rhogam  Cherrie Gauze [facility] 5/13/20171:07 PM

## 2015-12-28 NOTE — MAU Note (Signed)
Pt states she ate an entire can of pineapple last night. Pt states she had some cramping during the night and contractions that are painful this AM. Pt states she read on the internet that pineapple can induce labor and she came in because she was concerned. Pt also states decreased fetal movement this AM. Pt denies vaginal bleeding or LOF.

## 2015-12-28 NOTE — Discharge Instructions (Signed)

## 2015-12-29 LAB — CULTURE, OB URINE

## 2015-12-30 LAB — GC/CHLAMYDIA PROBE AMP (~~LOC~~) NOT AT ARMC
CHLAMYDIA, DNA PROBE: NEGATIVE
NEISSERIA GONORRHEA: NEGATIVE

## 2015-12-31 ENCOUNTER — Encounter: Payer: Self-pay | Admitting: Student

## 2015-12-31 ENCOUNTER — Ambulatory Visit (INDEPENDENT_AMBULATORY_CARE_PROVIDER_SITE_OTHER): Payer: Medicaid Other | Admitting: Student

## 2015-12-31 VITALS — BP 98/63 | HR 80 | Wt 156.4 lb

## 2015-12-31 DIAGNOSIS — O360120 Maternal care for anti-D [Rh] antibodies, second trimester, not applicable or unspecified: Secondary | ICD-10-CM

## 2015-12-31 DIAGNOSIS — Z3483 Encounter for supervision of other normal pregnancy, third trimester: Secondary | ICD-10-CM

## 2015-12-31 LAB — POCT URINALYSIS DIP (DEVICE)
BILIRUBIN URINE: NEGATIVE
GLUCOSE, UA: NEGATIVE mg/dL
Hgb urine dipstick: NEGATIVE
Ketones, ur: 40 mg/dL — AB
NITRITE: NEGATIVE
PH: 5.5 (ref 5.0–8.0)
PROTEIN: 30 mg/dL — AB
Specific Gravity, Urine: >=1.03 (ref 1.005–1.030)
Urobilinogen, UA: 0.2 mg/dL (ref 0.0–1.0)

## 2015-12-31 NOTE — Progress Notes (Signed)
Subjective:  Joyce SanesLaquanda Huntsman is a 30 y.o. G4P1021 at [redacted]w[redacted]d being seen today for ongoing prenatal care.  She is currently monitored for the following issues for this low-risk pregnancy and has Rh negative status during pregnancy in second trimester, antepartum; Supervision of normal subsequent pregnancy; and Nausea and vomiting during pregnancy prior to [redacted] weeks gestation on her problem list.  Patient reports no complaints.  Contractions: Not present. Vag. Bleeding: None.  Movement: Present. Denies leaking of fluid.  Pt concerned that she has not gained enough weight during this pregnancy. Since 16 weeks patient has gained 18 lbs.  The following portions of the patient's history were reviewed and updated as appropriate: allergies, current medications, past family history, past medical history, past social history, past surgical history and problem list. Problem list updated.  Objective:   Filed Vitals:   12/31/15 1613  BP: 98/63  Pulse: 80  Weight: 156 lb 6.4 oz (70.943 kg)    Fetal Status: Fetal Heart Rate (bpm): 150   Movement: Present     General:  Alert, oriented and cooperative. Patient is in no acute distress.  Skin: Skin is warm and dry. No rash noted.   Cardiovascular: Normal heart rate noted  Respiratory: Normal respiratory effort, no problems with respiration noted  Abdomen: Soft, gravid, appropriate for gestational age. Pain/Pressure: Present     Pelvic: Vag. Bleeding: None     Cervical exam deferred        Extremities: Normal range of motion.  Edema: None  Mental Status: Normal mood and affect. Normal behavior. Normal judgment and thought content.   Urinalysis:     Assessment and Plan:  Pregnancy: G4P1021 at 243w6d  1. Encounter for supervision of other normal pregnancy in third trimester -pt to return 5/19 for 28 week labs; not done today as patie  2. Rh negative status during pregnancy in second trimester, antepartum, not applicable or unspecified fetus -pt to return  5/19 for injection  Preterm labor symptoms and general obstetric precautions including but not limited to vaginal bleeding, contractions, leaking of fluid and fetal movement were reviewed in detail with the patient. Please refer to After Visit Summary for other counseling recommendations.  Return in about 2 weeks (around 01/14/2016) for Routine OB.   Judeth HornErin Khalie Wince, NP

## 2015-12-31 NOTE — Patient Instructions (Addendum)
Preterm Labor Information Preterm labor is when labor starts at less than 37 weeks of pregnancy. The normal length of a pregnancy is 39 to 41 weeks. CAUSES Often, there is no identifiable underlying cause as to why a woman goes into preterm labor. One of the most common known causes of preterm labor is infection. Infections of the uterus, cervix, vagina, amniotic sac, bladder, kidney, or even the lungs (pneumonia) can cause labor to start. Other suspected causes of preterm labor include:   Urogenital infections, such as yeast infections and bacterial vaginosis.   Uterine abnormalities (uterine shape, uterine septum, fibroids, or bleeding from the placenta).   A cervix that has been operated on (it may fail to stay closed).   Malformations in the fetus.   Multiple gestations (twins, triplets, and so on).   Breakage of the amniotic sac.  RISK FACTORS  Having a previous history of preterm labor.   Having premature rupture of membranes (PROM).   Having a placenta that covers the opening of the cervix (placenta previa).   Having a placenta that separates from the uterus (placental abruption).   Having a cervix that is too weak to hold the fetus in the uterus (incompetent cervix).   Having too much fluid in the amniotic sac (polyhydramnios).   Taking illegal drugs or smoking while pregnant.   Not gaining enough weight while pregnant.   Being younger than 37 and older than 28 years old.   Having a low socioeconomic status.   Being African American. SYMPTOMS Signs and symptoms of preterm labor include:   Menstrual-like cramps, abdominal pain, or back pain.  Uterine contractions that are regular, as frequent as six in an hour, regardless of their intensity (may be mild or painful).  Contractions that start on the top of the uterus and spread down to the lower abdomen and back.   A sense of increased pelvic pressure.   A watery or bloody mucus discharge that  comes from the vagina.  TREATMENT Depending on the length of the pregnancy and other circumstances, your health care provider may suggest bed rest. If necessary, there are medicines that can be given to stop contractions and to mature the fetal lungs. If labor happens before 34 weeks of pregnancy, a prolonged hospital stay may be recommended. Treatment depends on the condition of both you and the fetus.  WHAT SHOULD YOU DO IF YOU THINK YOU ARE IN PRETERM LABOR? Call your health care provider right away. You will need to go to the hospital to get checked immediately. HOW CAN YOU PREVENT PRETERM LABOR IN FUTURE PREGNANCIES? You should:   Stop smoking if you smoke.  Maintain healthy weight gain and avoid chemicals and drugs that are not necessary.  Be watchful for any type of infection.  Inform your health care provider if you have a known history of preterm labor.   This information is not intended to replace advice given to you by your health care provider. Make sure you discuss any questions you have with your health care provider.   Document Released: 10/24/2003 Document Revised: 04/05/2013 Document Reviewed: 09/05/2012 Elsevier Interactive Patient Education [employer] Exercise During Pregnancy For people of all ages, exercise is an important part of being healthy. Exercise improves heart and lung function and helps to maintain strength, flexibility, and a healthy body weight. Exercise also boosts energy levels and elevates mood. For most women, maintaining an exercise routine throughout pregnancy is recommended. It is only on rare occasions and with  certain medical conditions or pregnancy complications that women may be asked to limit or avoid exercise during pregnancy. WHAT ARE SOME OTHER BENEFITS TO EXERCISING DURING PREGNANCY? Along with maintaining strength and flexibility, exercising throughout pregnancy can help to:  Keep strength in muscles that are very important during  labor and childbirth.  Decrease low back pain during pregnancy.  Decrease the risk of developing gestational diabetes mellitus (GDM).  Improve blood sugar (glucose) control for women who have GDM.  Decrease the risk of developing preeclampsia. This is a serious condition that causes high blood pressure along with other symptoms, such as swelling and headaches.  Decrease the risk of cesarean delivery.  Speed up the recovery after giving birth. HOW OFTEN SHOULD I EXERCISE? Unless your health care provider gives you different instructions, you should try to exercise on most days or all days of the week. In general, try to exercise with moderate intensity for about 150 minutes per week. This can be spread out across several days, such as exercising for 30 minutes per day on 5 days of each week. You can tell that you are exercising at a moderate intensity if you have a higher heart rate and faster breathing, but you are still able to hold a conversation. WHAT TYPES OF MODERATE-INTENSITY EXERCISE ARE RECOMMENDED DURING PREGNANCY? There are many types of exercise that are safe for you to do during pregnancy. Unless your health care provider gives you different instructions, do a variety of exercises that safely increase your heart and breathing (cardiopulmonary) rates and help you to build and maintain muscle strength (strength training). You should always be able to talk in full sentences while exercising during pregnancy.  Some examples of exercising that is safe to do during pregnancy include:  Brisk walking or hiking.  Swimming.  Water aerobics.  Riding a stationary bike.  Strength training.  Modified yoga or Pilates. Tell your instructor that you are pregnant. Avoid overstretching and avoid lying on your back for long periods of time.  Running or jogging. Only choose this type of exercise if:  You ran or jogged regularly before your pregnancy.  You can run or jog and still talk in  complete sentences. WHAT TYPES OF EXERCISE SHOULD I NOT DO DURING PREGNANCY? Depending on your level of fitness and whether you exercised regularly before your pregnancy, you may be advised to limit vigorous-intensity exercise during your pregnancy. You can tell that you are exercising at a vigorous intensity if you are breathing much harder and faster and cannot hold a conversation while exercising. Some examples of exercising that you should avoid during pregnancy include:  Contact sports.  Activities that place you at risk for falling on or being hit in the belly, such as downhill skiing, water skiing, surfing, rock climbing, cycling, gymnastics, and horseback riding.  Scuba diving.  Sky diving.  Yoga or Pilates in a room that is heated to extreme temperatures ("hot yoga" or "hot Pilates").  Jogging or running, unless you ran or jogged regularly before your pregnancy. While jogging or running, you should always be able to talk in full sentences. Do not run or jog so vigorously that you are unable to have a conversation.  If you are not used to exercising at elevation (more than 6,000 feet above sea level), do not do so during your pregnancy. WHEN SHOULD I AVOID EXERCISING DURING PREGNANCY? Certain medical conditions can make it unsafe to exercise during pregnancy, or they may increase your risk of miscarriage or early  labor and birth. Some of these conditions include:  Some types of heart disease.  Some types of lung disease.  Placenta previa. This is when the placenta partially or completely covers the opening of the uterus (cervix).  Frequent bleeding from the vagina during your pregnancy.  Incompetent cervix. This is when your cervix does not remain as tightly closed during pregnancy as it should.  Premature labor.  Ruptured membranes. This is when the protective sac (amniotic sac) opens up and amniotic fluid leaks from your vagina.  Severely low blood count  (anemia).  Preeclampsia or pregnancy-caused high blood pressure.  Carrying more than one baby (multiple gestation) and having an additional risk of early labor.  Poorly controlled diabetes.  Being severely underweight or severely overweight.  Intrauterine growth restriction. This is when your baby's growth and development during pregnancy are slower than expected.  Other medical conditions. Ask your health care provider if any apply to you. WHAT ELSE SHOULD I KNOW ABOUT EXERCISING DURING PREGNANCY? You should take these precautions while exercising during pregnancy:   Avoid overheating.  Wear loose-fitting, breathable clothes.  Do not exercise in very high temperatures.  Avoid dehydration. Drink enough water before, during, and after exercise to keep your urine clear or pale yellow.  Avoid overstretching. Because of hormone changes during pregnancy, it is easy to overstretch muscles, tendons, and ligaments during pregnancy.  Start slowly and ask your health care provider to recommend types of exercise that are safe for you, if exercising regularly is new for you. Pregnancy is not a time for exercising to lose weight. WHEN SHOULD I SEEK MEDICAL CARE? You should stop exercising and call your health care provider if you have any unusual symptoms, such as:  Mild uterine contractions or abdominal cramping.  Dizziness that does not improve with rest. WHEN SHOULD I SEEK IMMEDIATE MEDICAL CARE? You should stop exercising and call your local emergency services (911 in the [place] if you have any unusual symptoms, such as:  Sudden, severe pain in your low back or your belly.  Uterine contractions or abdominal cramping that do not improve with rest.  Chest pain.  Bleeding or fluid leaking from your vagina.  Shortness of breath.   This information is not intended to replace advice given to you by your health care provider. Make sure you discuss any questions you have with your health  care provider.   Document Released: 08/03/2005 Document Revised: 04/24/2015 Document Reviewed: 10/11/2014 Elsevier Interactive Patient Education [employer]

## 2015-12-31 NOTE — Progress Notes (Signed)
Pt needs to return for 28 week labs.

## 2016-01-03 ENCOUNTER — Other Ambulatory Visit (INDEPENDENT_AMBULATORY_CARE_PROVIDER_SITE_OTHER): Payer: Medicaid Other

## 2016-01-03 DIAGNOSIS — Z3492 Encounter for supervision of normal pregnancy, unspecified, second trimester: Secondary | ICD-10-CM

## 2016-01-03 DIAGNOSIS — O360930 Maternal care for other rhesus isoimmunization, third trimester, not applicable or unspecified: Secondary | ICD-10-CM | POA: Diagnosis present

## 2016-01-03 DIAGNOSIS — Z3483 Encounter for supervision of other normal pregnancy, third trimester: Secondary | ICD-10-CM

## 2016-01-03 LAB — CBC
HCT: 31.5 % — ABNORMAL LOW (ref 35.0–45.0)
HEMOGLOBIN: 10.7 g/dL — AB (ref 11.7–15.5)
MCH: 31 pg (ref 27.0–33.0)
MCHC: 34 g/dL (ref 32.0–36.0)
MCV: 91.3 fL (ref 80.0–100.0)
MPV: 11.3 fL (ref 7.5–12.5)
PLATELETS: 198 10*3/uL (ref 140–400)
RBC: 3.45 MIL/uL — AB (ref 3.80–5.10)
RDW: 13.2 % (ref 11.0–15.0)
WBC: 10.1 10*3/uL (ref 3.8–10.8)

## 2016-01-03 LAB — HIV ANTIBODY (ROUTINE TESTING W REFLEX): HIV: NONREACTIVE

## 2016-01-03 MED ORDER — RHO D IMMUNE GLOBULIN 1500 UNIT/2ML IJ SOSY
300.0000 ug | PREFILLED_SYRINGE | Freq: Once | INTRAMUSCULAR | Status: AC
  Administered 2016-01-03: 300 ug via INTRAMUSCULAR

## 2016-01-04 LAB — GLUCOSE TOLERANCE, 1 HOUR (50G) W/O FASTING: GLUCOSE, 1 HR, GESTATIONAL: 123 mg/dL (ref <140)

## 2016-01-04 LAB — RPR

## 2016-01-22 ENCOUNTER — Ambulatory Visit (INDEPENDENT_AMBULATORY_CARE_PROVIDER_SITE_OTHER): Payer: Medicaid Other | Admitting: Advanced Practice Midwife

## 2016-01-22 VITALS — BP 105/59 | HR 97 | Wt 161.4 lb

## 2016-01-22 DIAGNOSIS — Z3483 Encounter for supervision of other normal pregnancy, third trimester: Secondary | ICD-10-CM | POA: Diagnosis not present

## 2016-01-22 DIAGNOSIS — IMO0002 Reserved for concepts with insufficient information to code with codable children: Secondary | ICD-10-CM

## 2016-01-22 DIAGNOSIS — Z0489 Encounter for examination and observation for other specified reasons: Secondary | ICD-10-CM

## 2016-01-22 NOTE — Progress Notes (Signed)
Subjective:  Joyce Rice is a 30 y.o. G4P1021 at 8320w0d being seen today for ongoing prenatal care.  She is currently monitored for the following issues for this low-risk pregnancy and has Rh negative status during pregnancy in second trimester, antepartum; Supervision of normal subsequent pregnancy; and Nausea and vomiting during pregnancy prior to [redacted] weeks gestation on her problem list.  Patient reports she is not sleeping well and is concerned she is supposed to have another ultrasound.  Contractions: Not present. Vag. Bleeding: None.  Movement: Present. Denies leaking of fluid.   The following portions of the patient's history were reviewed and updated as appropriate: allergies, current medications, past family history, past medical history, past social history, past surgical history and problem list. Problem list updated.  Objective:   Filed Vitals:   01/22/16 0833  BP: 105/59  Pulse: 97  Weight: 161 lb 6.4 oz (73.211 kg)    Fetal Status: Fetal Heart Rate (bpm): 157   Movement: Present     General:  Alert, oriented and cooperative. Patient is in no acute distress.  Skin: Skin is warm and dry. No rash noted.   Cardiovascular: Normal heart rate noted  Respiratory: Normal respiratory effort, no problems with respiration noted  Abdomen: Soft, gravid, appropriate for gestational age. Pain/Pressure: Present     Pelvic: Vag. Bleeding: None     Cervical exam deferred        Extremities: Normal range of motion.  Edema: Trace  Mental Status: Normal mood and affect. Normal behavior. Normal judgment and thought content.   Urinalysis:      Assessment and Plan:  Pregnancy: G4P1021 at 5320w0d  1. Encounter for supervision of other normal pregnancy in third trimester --Discussed normal sleep disturbances of pregnancy.  Pt does not desire medication, she reports she is doing well at this time.   2. Evaluate anatomy not seen on prior sonogram --Limited views of spine on previous US, pt  concerned about baby based on anatomy US done at 20 weeks.  Follow up ordered to complete anatomy. - US MFM OB FOLLOW UP; Future  Preterm labor symptoms and general obstetric precautions including but not limited to vaginal bleeding, contractions, leaking of fluid and fetal movement were reviewed in detail with the patient. Please refer to After Visit Summary for other counseling recommendations.  No Follow-up on file.   Hurshel PartyLisa A Leftwich-Kirby, CNM

## 2016-01-22 NOTE — Progress Notes (Signed)
Breastfeeding tip reviewed Needs u/a

## 2016-01-28 ENCOUNTER — Other Ambulatory Visit: Payer: Self-pay | Admitting: Advanced Practice Midwife

## 2016-01-28 ENCOUNTER — Ambulatory Visit (HOSPITAL_COMMUNITY)
Admission: RE | Admit: 2016-01-28 | Discharge: 2016-01-28 | Disposition: A | Discharge status: Home / Self Care | Payer: Medicaid Other | Source: Ambulatory Visit | Attending: Advanced Practice Midwife | Admitting: Advanced Practice Midwife

## 2016-01-28 DIAGNOSIS — Z36 Encounter for antenatal screening of mother: Secondary | ICD-10-CM | POA: Insufficient documentation

## 2016-01-28 DIAGNOSIS — IMO0002 Reserved for concepts with insufficient information to code with codable children: Secondary | ICD-10-CM

## 2016-01-28 DIAGNOSIS — Z3A33 33 weeks gestation of pregnancy: Secondary | ICD-10-CM

## 2016-01-28 DIAGNOSIS — Z0489 Encounter for examination and observation for other specified reasons: Secondary | ICD-10-CM

## 2016-02-05 ENCOUNTER — Ambulatory Visit (INDEPENDENT_AMBULATORY_CARE_PROVIDER_SITE_OTHER): Payer: Medicaid Other | Admitting: Advanced Practice Midwife

## 2016-02-05 VITALS — BP 103/66 | HR 87 | Wt 160.5 lb

## 2016-02-05 DIAGNOSIS — W1809XA Striking against other object with subsequent fall, initial encounter: Secondary | ICD-10-CM

## 2016-02-05 DIAGNOSIS — Z3483 Encounter for supervision of other normal pregnancy, third trimester: Secondary | ICD-10-CM

## 2016-02-05 DIAGNOSIS — O99013 Anemia complicating pregnancy, third trimester: Secondary | ICD-10-CM

## 2016-02-05 DIAGNOSIS — D509 Iron deficiency anemia, unspecified: Secondary | ICD-10-CM

## 2016-02-05 LAB — POCT URINALYSIS DIP (DEVICE)
BILIRUBIN URINE: NEGATIVE
Glucose, UA: NEGATIVE mg/dL
KETONES UR: NEGATIVE mg/dL
NITRITE: NEGATIVE
Protein, ur: NEGATIVE mg/dL
Specific Gravity, Urine: 1.025 (ref 1.005–1.030)
Urobilinogen, UA: 0.2 mg/dL (ref 0.0–1.0)
pH: 6 (ref 5.0–8.0)

## 2016-02-05 MED ORDER — FERROUS SULFATE 325 (65 FE) MG PO TABS: 325.0000 mg | ORAL_TABLET | Freq: Two times a day (BID) | ORAL | Status: DC

## 2016-02-05 NOTE — Progress Notes (Signed)
Educated pt on Benefits of Breastfeeding to Baby Pt reports that she scraped side of belly on Monday has felt baby move since

## 2016-02-05 NOTE — Progress Notes (Signed)
Subjective:  Joyce Rice is a 30 y.o. G4P1021 at 6974w0d being seen today for ongoing prenatal care.  She is currently monitored for the following issues for this low-risk pregnancy and has Rh negative status during pregnancy in second trimester, antepartum; Supervision of normal subsequent pregnancy; and Nausea and vomiting during pregnancy prior to [redacted] weeks gestation on her problem list.  Patient reports occasional dizziness and fatigue, fall 1 week ago with scrape to left side of abdomen.  Contractions: Not present. Vag. Bleeding: None.  Movement: Present. Denies leaking of fluid.   The following portions of the patient's history were reviewed and updated as appropriate: allergies, current medications, past family history, past medical history, past social history, past surgical history and problem list. Problem list updated.  Objective:   Filed Vitals:   02/05/16 0946  BP: 103/66  Pulse: 87  Weight: 160 lb 8 oz (72.802 kg)    Fetal Status: Fetal Heart Rate (bpm): 151 Fundal Height: 36 cm Movement: Present     General:  Alert, oriented and cooperative. Patient is in no acute distress.  Skin: Skin is warm and dry. No rash noted.   Cardiovascular: Normal heart rate noted  Respiratory: Normal respiratory effort, no problems with respiration noted  Abdomen: Soft, gravid, appropriate for gestational age. Pain/Pressure: Present     Pelvic: Cervical exam deferred        Extremities: Normal range of motion.  Edema: Trace  Mental Status: Normal mood and affect. Normal behavior. Normal judgment and thought content.   Urinalysis: Urine Protein: Negative Urine Glucose: Negative  Assessment and Plan:  Pregnancy: G4P1021 at 1374w0d  1. Encounter for supervision of other normal pregnancy in third trimester  - POCT urinalysis dip (device)  2. Iron deficiency anemia of pregnancy, third trimester --Pt reports dizziness and fatigue most days.  Hgb 10.7 at 28 weeks. She is taking PNV most days  when not nauseous.  Will add daily ferrous sulfate and reevaluate at next visit.  - ferrous sulfate (FERROUSUL) 325 (65 FE) MG tablet; Take 1 tablet (325 mg total) by mouth 2 (two) times daily.  Dispense: 60 tablet; Refill: 1  3. Fall against object, initial encounter --Pt had skin scrape on left side of abdomen and abdominal soreness x 2 days.  Baby moving normally after fall and today.  No bleeding.  No pain today.  Precautions given/reasons to come to [facility].   Preterm labor symptoms and general obstetric precautions including but not limited to vaginal bleeding, contractions, leaking of fluid and fetal movement were reviewed in detail with the patient. Please refer to After Visit Summary for other counseling recommendations.  Return in about 2 weeks (around 02/19/2016).   Hurshel PartyLisa A Leftwich-Kirby, CNM

## 2016-02-26 ENCOUNTER — Ambulatory Visit (INDEPENDENT_AMBULATORY_CARE_PROVIDER_SITE_OTHER): Payer: Medicaid Other | Admitting: Family Medicine

## 2016-02-26 VITALS — BP 109/72 | HR 76 | Temp 98.2°F | Wt 167.7 lb

## 2016-02-26 DIAGNOSIS — O360130 Maternal care for anti-D [Rh] antibodies, third trimester, not applicable or unspecified: Secondary | ICD-10-CM

## 2016-02-26 DIAGNOSIS — O26893 Other specified pregnancy related conditions, third trimester: Secondary | ICD-10-CM

## 2016-02-26 DIAGNOSIS — O360120 Maternal care for anti-D [Rh] antibodies, second trimester, not applicable or unspecified: Secondary | ICD-10-CM

## 2016-02-26 DIAGNOSIS — N898 Other specified noninflammatory disorders of vagina: Secondary | ICD-10-CM

## 2016-02-26 DIAGNOSIS — Z3483 Encounter for supervision of other normal pregnancy, third trimester: Secondary | ICD-10-CM

## 2016-02-26 LAB — POCT URINALYSIS DIP (DEVICE)
Bilirubin Urine: NEGATIVE
GLUCOSE, UA: NEGATIVE mg/dL
HGB URINE DIPSTICK: NEGATIVE
KETONES UR: 15 mg/dL — AB
LEUKOCYTES UA: NEGATIVE
Nitrite: NEGATIVE
Protein, ur: 30 mg/dL — AB
SPECIFIC GRAVITY, URINE: 1.025 (ref 1.005–1.030)
Urobilinogen, UA: 0.2 mg/dL (ref 0.0–1.0)
pH: 6 (ref 5.0–8.0)

## 2016-02-26 MED ORDER — BREAST PUMP MISC: 1.0000 | Status: DC

## 2016-02-26 NOTE — Progress Notes (Signed)
Pt states she has been leaking a little more than usual and it started on Monday 02/24/16.  Ketones (15) in urine

## 2016-02-26 NOTE — Progress Notes (Signed)
Subjective:  Joyce Rice is a 30 y.o. G4P1021 at 5261w0d being seen today for ongoing prenatal care.  She is currently monitored for the following issues for this low-risk pregnancy and has Rh negative status during pregnancy in second trimester, antepartum; Supervision of normal subsequent pregnancy; and Nausea and vomiting during pregnancy prior to [redacted] weeks gestation on her problem list.  Patient reports increased vaginal discharge - clear/white.  Thin.  No odor.  Contractions: Not present. Vag. Bleeding: None.  Movement: Present. Denies leaking of fluid.   The following portions of the patient's history were reviewed and updated as appropriate: allergies, current medications, past family history, past medical history, past social history, past surgical history and problem list. Problem list updated.  Objective:   Filed Vitals:   02/26/16 1520  BP: 109/72  Pulse: 76  Temp: 98.2 F (36.8 C)  Weight: 167 lb 11.2 oz (76.068 kg)    Fetal Status: Fetal Heart Rate (bpm): 151   Movement: Present     General:  Alert, oriented and cooperative. Patient is in no acute distress.  Skin: Skin is warm and dry. No rash noted.   Cardiovascular: Normal heart rate noted  Respiratory: Normal respiratory effort, no problems with respiration noted  Abdomen: Soft, gravid, appropriate for gestational age. Pain/Pressure: Present     Pelvic:  Cervical exam deferred        Extremities: Normal range of motion.  Edema: Mild pitting, slight indentation  Mental Status: Normal mood and affect. Normal behavior. Normal judgment and thought content.   Urinalysis: Urine Protein: 1+ Urine Glucose: Negative  Assessment and Plan:  Pregnancy: G4P1021 at 7261w0d  1. Encounter for supervision of other normal pregnancy in third trimester FHT, FH normal.  Wet prep done, although likely normal physiologic discharge.  2. Rh negative status during pregnancy in second trimester, antepartum, not applicable or unspecified  fetus   3. Vaginal discharge during pregnancy, third trimester - Wet prep, genital  Term labor symptoms and general obstetric precautions including but not limited to vaginal bleeding, contractions, leaking of fluid and fetal movement were reviewed in detail with the patient. Please refer to After Visit Summary for other counseling recommendations.  Return in about 1 week (around 03/04/2016).   Levie HeritageJacob J Stinson, DO

## 2016-02-27 LAB — WET PREP, GENITAL
Trich, Wet Prep: NONE SEEN
WBC, Wet Prep HPF POC: NONE SEEN
Yeast Wet Prep HPF POC: NONE SEEN

## 2016-02-28 ENCOUNTER — Other Ambulatory Visit: Payer: Self-pay | Admitting: Family Medicine

## 2016-02-28 MED ORDER — METRONIDAZOLE 500 MG PO TABS: 500.0000 mg | ORAL_TABLET | Freq: Two times a day (BID) | ORAL | Status: DC

## 2016-03-03 ENCOUNTER — Telehealth: Payer: Self-pay | Admitting: *Deleted

## 2016-03-03 NOTE — Telephone Encounter (Signed)
Wet prep shows BV - flagyl sent to pharmacy I called patient and left her a message stating to call us back and let us know if we could leave a detail message.

## 2016-03-03 NOTE — Telephone Encounter (Signed)
Pt requests test results

## 2016-03-04 NOTE — Telephone Encounter (Addendum)
Called pt and left message that we are calling again with her results information.  Please call back and state whether a detailed message can be left on her voice mail.   7/21  1347  Called pt and left message stating that we have been trying to reach her to provide test results as requested. A prescription has been sent to her pharmacy. She may call back if she has additional questions. Per chart review, pt was here today for prenatal visit.

## 2016-03-05 ENCOUNTER — Ambulatory Visit (INDEPENDENT_AMBULATORY_CARE_PROVIDER_SITE_OTHER): Payer: Medicaid Other | Admitting: Family

## 2016-03-05 VITALS — BP 107/64 | HR 73 | Wt 170.9 lb

## 2016-03-05 DIAGNOSIS — O360120 Maternal care for anti-D [Rh] antibodies, second trimester, not applicable or unspecified: Secondary | ICD-10-CM

## 2016-03-05 DIAGNOSIS — Z3483 Encounter for supervision of other normal pregnancy, third trimester: Secondary | ICD-10-CM

## 2016-03-05 DIAGNOSIS — Z36 Encounter for antenatal screening of mother: Secondary | ICD-10-CM

## 2016-03-05 DIAGNOSIS — Z1389 Encounter for screening for other disorder: Secondary | ICD-10-CM

## 2016-03-05 DIAGNOSIS — O358XX0 Maternal care for other (suspected) fetal abnormality and damage, not applicable or unspecified: Secondary | ICD-10-CM

## 2016-03-05 DIAGNOSIS — O283 Abnormal ultrasonic finding on antenatal screening of mother: Secondary | ICD-10-CM

## 2016-03-05 DIAGNOSIS — O35HXX0 Maternal care for other (suspected) fetal abnormality and damage, fetal lower extremities anomalies, not applicable or unspecified: Secondary | ICD-10-CM

## 2016-03-05 DIAGNOSIS — Z363 Encounter for antenatal screening for malformations: Secondary | ICD-10-CM

## 2016-03-05 LAB — POCT URINALYSIS DIP (DEVICE)
Bilirubin Urine: NEGATIVE
Glucose, UA: NEGATIVE mg/dL
Hgb urine dipstick: NEGATIVE
Ketones, ur: NEGATIVE mg/dL
Nitrite: NEGATIVE
PH: 5.5 (ref 5.0–8.0)
PROTEIN: NEGATIVE mg/dL
Specific Gravity, Urine: 1.025 (ref 1.005–1.030)
Urobilinogen, UA: 0.2 mg/dL (ref 0.0–1.0)

## 2016-03-06 DIAGNOSIS — O35HXX0 Maternal care for other (suspected) fetal abnormality and damage, fetal lower extremities anomalies, not applicable or unspecified: Secondary | ICD-10-CM | POA: Insufficient documentation

## 2016-03-06 DIAGNOSIS — O358XX0 Maternal care for other (suspected) fetal abnormality and damage, not applicable or unspecified: Secondary | ICD-10-CM | POA: Insufficient documentation

## 2016-03-06 NOTE — Progress Notes (Signed)
Subjective:  Joyce Rice is a 30 y.o. G4P1021 at 2316w2d being seen today for ongoing prenatal care.  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  Patient reports no complaints.  Contractions: Not present. Vag. Bleeding: None.  Movement: Present. Denies leaking of fluid.   The following portions of the patient's history were reviewed and updated as appropriate: allergies, current medications, past family history, past medical history, past social history, past surgical history and problem list. Problem list updated.  Objective:   Filed Vitals:   03/05/16 1419  BP: 107/64  Pulse: 73  Weight: 170 lb 14.4 oz (77.52 kg)    Fetal Status: Fetal Heart Rate (bpm): 145 Fundal Height: 39 cm Movement: Present  Presentation: Vertex  General:  Alert, oriented and cooperative. Patient is in no acute distress.  Skin: Skin is warm and dry. No rash noted.   Cardiovascular: Normal heart rate noted  Respiratory: Normal respiratory effort, no problems with respiration noted  Abdomen: Soft, gravid, appropriate for gestational age. Pain/Pressure: Present     Pelvic:  Cervical exam performed Dilation: Closed Effacement (%): Thick Station: Ballotable  Extremities: Normal range of motion.  Edema: Trace  Mental Status: Normal mood and affect. Normal behavior. Normal judgment and thought content.   Urinalysis: Urine Protein: Negative Urine Glucose: Negative  Assessment and Plan:  Pregnancy: G4P1021 at 116w2d  1. Encounter for supervision of other normal pregnancy in third trimester - US MFM FETAL BPP W/NONSTRESS; Future > postdates next week  2. Encounter for routine screening for malformation using ultrasonics - US MFM  OB FOLLOW UP; Future - US MFM FETAL BPP W/NONSTRESS; Future  3. 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 Rh negative status during pregnancy in second trimester, antepartum, not applicable or unspecified fetus - Received Rhophylac  Term labor symptoms and general obstetric precautions including but not limited to vaginal bleeding, contractions, leaking of fluid and fetal movement were reviewed in detail with the patient. Please refer to After Visit Summary for other counseling recommendations.  Return in about 1 week (around 03/12/2016).   Eino FarberWalidah Kennith GainN Karim, CNM

## 2016-03-09 ENCOUNTER — Encounter (HOSPITAL_COMMUNITY): Payer: Self-pay | Admitting: *Deleted

## 2016-03-09 ENCOUNTER — Inpatient Hospital Stay (HOSPITAL_COMMUNITY)
Admission: AD | Admit: 2016-03-09 | Discharge: 2016-03-09 | Disposition: A | Discharge status: Home / Self Care | Payer: Medicaid Other | Source: Ambulatory Visit | Attending: Family Medicine | Admitting: Family Medicine

## 2016-03-09 DIAGNOSIS — O358XX0 Maternal care for other (suspected) fetal abnormality and damage, not applicable or unspecified: Secondary | ICD-10-CM

## 2016-03-09 DIAGNOSIS — O35HXX0 Maternal care for other (suspected) fetal abnormality and damage, fetal lower extremities anomalies, not applicable or unspecified: Secondary | ICD-10-CM

## 2016-03-09 MED ORDER — PROMETHAZINE HCL 25 MG/ML IJ SOLN
25.0000 mg | Freq: Once | INTRAMUSCULAR | Status: AC
  Administered 2016-03-09: 25 mg via INTRAMUSCULAR
  Filled 2016-03-09: qty 1

## 2016-03-09 MED ORDER — FENTANYL CITRATE (PF) 100 MCG/2ML IJ SOLN
50.0000 ug | Freq: Once | INTRAMUSCULAR | Status: AC
  Administered 2016-03-09: 50 ug via INTRAMUSCULAR
  Filled 2016-03-09: qty 2

## 2016-03-09 MED ORDER — OXYCODONE-ACETAMINOPHEN 5-325 MG PO TABS
2.0000 | ORAL_TABLET | Freq: Once | ORAL | Status: AC
  Administered 2016-03-09: 2 via ORAL
  Filled 2016-03-09: qty 2

## 2016-03-09 NOTE — Discharge Instructions (Signed)
Braxton Hicks Contractions °Contractions of the uterus can occur throughout pregnancy. Contractions are not always a sign that you are in labor.  °WHAT ARE BRAXTON HICKS CONTRACTIONS?  °Contractions that occur before labor are called Braxton Hicks contractions, or false labor. Toward the end of pregnancy (32-34 weeks), these contractions can develop more often and may become more forceful. This is not true labor because these contractions do not result in opening (dilatation) and thinning of the cervix. They are sometimes difficult to tell apart from true labor because these contractions can be forceful and people have different pain tolerances. You should not feel embarrassed if you go to the hospital with false labor. Sometimes, the only way to tell if you are in true labor is for your health care provider to look for changes in the cervix. °If there are no prenatal problems or other health problems associated with the pregnancy, it is completely safe to be sent home with false labor and await the onset of true labor. °HOW CAN YOU TELL THE DIFFERENCE BETWEEN TRUE AND FALSE LABOR? °False Labor °· The contractions of false labor are usually shorter and not as hard as those of true labor.   °· The contractions are usually irregular.   °· The contractions are often felt in the front of the lower abdomen and in the groin.   °· The contractions may go away when you walk around or change positions while lying down.   °· The contractions get weaker and are shorter lasting as time goes on.   °· The contractions do not usually become progressively stronger, regular, and closer together as with true labor.   °True Labor °· Contractions in true labor last 30-70 seconds, become very regular, usually become more intense, and increase in frequency.   °· The contractions do not go away with walking.   °· The discomfort is usually felt in the top of the uterus and spreads to the lower abdomen and low back.   °· True labor can be  determined by your health care provider with an exam. This will show that the cervix is dilating and getting thinner.   °WHAT TO REMEMBER °· Keep up with your usual exercises and follow other instructions given by your health care provider.   °· Take medicines as directed by your health care provider.   °· Keep your regular prenatal appointments.   °· Eat and drink lightly if you think you are going into labor.   °· If Braxton Hicks contractions are making you uncomfortable:   °¨ Change your position from lying down or resting to walking, or from walking to resting.   °¨ Sit and rest in a tub of warm water.   °¨ Drink 2-3 glasses of water. Dehydration may cause these contractions.   °¨ Do slow and deep breathing several times an hour.   °WHEN SHOULD I SEEK IMMEDIATE MEDICAL CARE? °Seek immediate medical care if: °· Your contractions become stronger, more regular, and closer together.   °· You have fluid leaking or gushing from your vagina.   °· You have a fever.   °· You pass blood-tinged mucus.   °· You have vaginal bleeding.   °· You have continuous abdominal pain.   °· You have low back pain that you never had before.   °· You feel your baby's head pushing down and causing pelvic pressure.   °· Your baby is not moving as much as it used to.   °  °This information is not intended to replace advice given to you by your health care provider. Make sure you discuss any questions you have with your health care   provider. °  °Document Released: 08/03/2005 Document Revised: 08/08/2013 Document Reviewed: 05/15/2013 °Elsevier Interactive Patient Education ©2016 [employer] ° °

## 2016-03-09 NOTE — MAU Note (Signed)
Contractions, getting closer and stronger. Water broke around 1000, clear fluid still coming.  Started bleeding around 3. Was closed at last appt

## 2016-03-09 NOTE — MAU Note (Signed)
Pt. States she was having pain in am then passed her mucous plug. Around 0930 felt water leaking and it is clear. Pt. States around 1500 started to see some blood with the mucous. Pt. States baby is moving well.

## 2016-03-10 ENCOUNTER — Inpatient Hospital Stay (HOSPITAL_COMMUNITY): Payer: Medicaid Other | Admitting: Anesthesiology

## 2016-03-10 ENCOUNTER — Inpatient Hospital Stay (HOSPITAL_COMMUNITY)
Admission: AD | Admit: 2016-03-10 | Discharge: 2016-03-12 | DRG: 775 | Disposition: A | Discharge status: Home / Self Care | Payer: Medicaid Other | Source: Ambulatory Visit | Attending: Obstetrics & Gynecology | Admitting: Obstetrics & Gynecology

## 2016-03-10 ENCOUNTER — Encounter (HOSPITAL_COMMUNITY): Payer: Self-pay | Admitting: *Deleted

## 2016-03-10 ENCOUNTER — Telehealth: Payer: Self-pay | Admitting: *Deleted

## 2016-03-10 DIAGNOSIS — Z3483 Encounter for supervision of other normal pregnancy, third trimester: Secondary | ICD-10-CM | POA: Diagnosis present

## 2016-03-10 DIAGNOSIS — Z6791 Unspecified blood type, Rh negative: Secondary | ICD-10-CM

## 2016-03-10 DIAGNOSIS — IMO0001 Reserved for inherently not codable concepts without codable children: Secondary | ICD-10-CM

## 2016-03-10 DIAGNOSIS — O26893 Other specified pregnancy related conditions, third trimester: Secondary | ICD-10-CM | POA: Diagnosis present

## 2016-03-10 DIAGNOSIS — Z3A39 39 weeks gestation of pregnancy: Secondary | ICD-10-CM | POA: Diagnosis not present

## 2016-03-10 LAB — CBC
HCT: 35.5 % — ABNORMAL LOW (ref 36.0–46.0)
Hemoglobin: 12.4 g/dL (ref 12.0–15.0)
MCH: 31.2 pg (ref 26.0–34.0)
MCHC: 34.9 g/dL (ref 30.0–36.0)
MCV: 89.2 fL (ref 78.0–100.0)
PLATELETS: 201 10*3/uL (ref 150–400)
RBC: 3.98 MIL/uL (ref 3.87–5.11)
RDW: 13.1 % (ref 11.5–15.5)
WBC: 12.3 10*3/uL — AB (ref 4.0–10.5)

## 2016-03-10 LAB — GROUP B STREP BY PCR: GROUP B STREP BY PCR: NEGATIVE

## 2016-03-10 LAB — OB RESULTS CONSOLE GBS: STREP GROUP B AG: NEGATIVE

## 2016-03-10 LAB — RPR: RPR Ser Ql: NONREACTIVE

## 2016-03-10 MED ORDER — PHENYLEPHRINE 40 MCG/ML (10ML) SYRINGE FOR IV PUSH (FOR BLOOD PRESSURE SUPPORT)
80.0000 ug | PREFILLED_SYRINGE | INTRAVENOUS | Status: DC | PRN
  Filled 2016-03-10: qty 10
  Filled 2016-03-10: qty 5

## 2016-03-10 MED ORDER — ONDANSETRON HCL 4 MG PO TABS: 4.0000 mg | ORAL_TABLET | ORAL | Status: DC | PRN

## 2016-03-10 MED ORDER — FENTANYL CITRATE (PF) 100 MCG/2ML IJ SOLN
100.0000 ug | Freq: Once | INTRAMUSCULAR | Status: AC
  Administered 2016-03-10: 100 ug via INTRAVENOUS
  Filled 2016-03-10: qty 2

## 2016-03-10 MED ORDER — DIPHENHYDRAMINE HCL 25 MG PO CAPS: 25.0000 mg | ORAL_CAPSULE | Freq: Four times a day (QID) | ORAL | Status: DC | PRN

## 2016-03-10 MED ORDER — SIMETHICONE 80 MG PO CHEW: 80.0000 mg | CHEWABLE_TABLET | ORAL | Status: DC | PRN

## 2016-03-10 MED ORDER — LIDOCAINE HCL (PF) 1 % IJ SOLN
INTRAMUSCULAR | Status: DC | PRN
  Administered 2016-03-10: 7 mL via EPIDURAL
  Administered 2016-03-10: 6 mL via EPIDURAL

## 2016-03-10 MED ORDER — EPHEDRINE 5 MG/ML INJ
10.0000 mg | INTRAVENOUS | Status: DC | PRN
  Filled 2016-03-10: qty 4

## 2016-03-10 MED ORDER — OXYTOCIN BOLUS FROM INFUSION: 500.0000 mL | Freq: Once | INTRAVENOUS | Status: DC

## 2016-03-10 MED ORDER — ZOLPIDEM TARTRATE 5 MG PO TABS: 5.0000 mg | ORAL_TABLET | Freq: Every evening | ORAL | Status: DC | PRN

## 2016-03-10 MED ORDER — IBUPROFEN 600 MG PO TABS
600.0000 mg | ORAL_TABLET | Freq: Four times a day (QID) | ORAL | Status: DC
  Administered 2016-03-10 – 2016-03-12 (×7): 600 mg via ORAL
  Filled 2016-03-10 (×7): qty 1

## 2016-03-10 MED ORDER — ACETAMINOPHEN 325 MG PO TABS
650.0000 mg | ORAL_TABLET | ORAL | Status: DC | PRN
  Administered 2016-03-10 – 2016-03-11 (×2): 650 mg via ORAL
  Filled 2016-03-10 (×2): qty 2

## 2016-03-10 MED ORDER — COCONUT OIL OIL
1.0000 "application " | TOPICAL_OIL | Status: DC | PRN
  Administered 2016-03-11: 1 via TOPICAL
  Filled 2016-03-10: qty 120

## 2016-03-10 MED ORDER — SOD CITRATE-CITRIC ACID 500-334 MG/5ML PO SOLN: 30.0000 mL | ORAL | Status: DC | PRN

## 2016-03-10 MED ORDER — LIDOCAINE HCL (PF) 1 % IJ SOLN
30.0000 mL | INTRAMUSCULAR | Status: DC | PRN
  Filled 2016-03-10: qty 30

## 2016-03-10 MED ORDER — PENICILLIN G POTASSIUM 5000000 UNITS IJ SOLR: 5.0000 10*6.[IU] | Freq: Once | INTRAVENOUS | Status: DC

## 2016-03-10 MED ORDER — FENTANYL CITRATE (PF) 100 MCG/2ML IJ SOLN: 50.0000 ug | INTRAMUSCULAR | Status: DC | PRN

## 2016-03-10 MED ORDER — WITCH HAZEL-GLYCERIN EX PADS
1.0000 | MEDICATED_PAD | CUTANEOUS | Status: DC | PRN
Start: 2016-03-10 — End: 2016-03-12

## 2016-03-10 MED ORDER — FENTANYL 2.5 MCG/ML BUPIVACAINE 1/10 % EPIDURAL INFUSION (WH - ANES)
14.0000 mL/h | INTRAMUSCULAR | Status: DC | PRN
  Administered 2016-03-10: 14 mL/h via EPIDURAL
  Filled 2016-03-10: qty 125

## 2016-03-10 MED ORDER — ONDANSETRON HCL 4 MG/2ML IJ SOLN: 4.0000 mg | INTRAMUSCULAR | Status: DC | PRN

## 2016-03-10 MED ORDER — TETANUS-DIPHTH-ACELL PERTUSSIS 5-2.5-18.5 LF-MCG/0.5 IM SUSP: 0.5000 mL | Freq: Once | INTRAMUSCULAR | Status: DC

## 2016-03-10 MED ORDER — ACETAMINOPHEN 325 MG PO TABS: 650.0000 mg | ORAL_TABLET | ORAL | Status: DC | PRN

## 2016-03-10 MED ORDER — PENICILLIN G POTASSIUM 5000000 UNITS IJ SOLR: 2.5000 10*6.[IU] | INTRAVENOUS | Status: DC

## 2016-03-10 MED ORDER — SENNOSIDES-DOCUSATE SODIUM 8.6-50 MG PO TABS
2.0000 | ORAL_TABLET | ORAL | Status: DC
Start: 2016-03-11 — End: 2016-03-12
  Administered 2016-03-11 – 2016-03-12 (×2): 2 via ORAL
  Filled 2016-03-10 (×2): qty 2

## 2016-03-10 MED ORDER — ONDANSETRON HCL 4 MG/2ML IJ SOLN: 4.0000 mg | Freq: Four times a day (QID) | INTRAMUSCULAR | Status: DC | PRN

## 2016-03-10 MED ORDER — LACTATED RINGERS IV SOLN
500.0000 mL | Freq: Once | INTRAVENOUS | Status: AC
  Administered 2016-03-10: 500 mL via INTRAVENOUS

## 2016-03-10 MED ORDER — OXYCODONE-ACETAMINOPHEN 5-325 MG PO TABS: 2.0000 | ORAL_TABLET | ORAL | Status: DC | PRN

## 2016-03-10 MED ORDER — LACTATED RINGERS IV SOLN: 500.0000 mL | INTRAVENOUS | Status: DC | PRN

## 2016-03-10 MED ORDER — PRENATAL MULTIVITAMIN CH
1.0000 | ORAL_TABLET | Freq: Every day | ORAL | Status: DC
  Administered 2016-03-11: 1 via ORAL
  Filled 2016-03-10: qty 1

## 2016-03-10 MED ORDER — BENZOCAINE-MENTHOL 20-0.5 % EX AERO
1.0000 "application " | INHALATION_SPRAY | CUTANEOUS | Status: DC | PRN
  Administered 2016-03-10: 1 via TOPICAL
  Filled 2016-03-10 (×2): qty 56

## 2016-03-10 MED ORDER — PHENYLEPHRINE 40 MCG/ML (10ML) SYRINGE FOR IV PUSH (FOR BLOOD PRESSURE SUPPORT)
80.0000 ug | PREFILLED_SYRINGE | INTRAVENOUS | Status: DC | PRN
  Filled 2016-03-10: qty 5

## 2016-03-10 MED ORDER — DIPHENHYDRAMINE HCL 50 MG/ML IJ SOLN: 12.5000 mg | INTRAMUSCULAR | Status: DC | PRN

## 2016-03-10 MED ORDER — LACTATED RINGERS IV SOLN
INTRAVENOUS | Status: DC
  Administered 2016-03-10: 125 mL/h via INTRAVENOUS

## 2016-03-10 MED ORDER — OXYCODONE-ACETAMINOPHEN 5-325 MG PO TABS: 1.0000 | ORAL_TABLET | ORAL | Status: DC | PRN

## 2016-03-10 MED ORDER — DIBUCAINE 1 % RE OINT: 1.0000 "application " | TOPICAL_OINTMENT | RECTAL | Status: DC | PRN

## 2016-03-10 MED ORDER — OXYTOCIN 40 UNITS IN LACTATED RINGERS INFUSION - SIMPLE MED
2.5000 [IU]/h | INTRAVENOUS | Status: DC
  Filled 2016-03-10: qty 1000

## 2016-03-10 NOTE — Anesthesia Pain Management Evaluation Note (Signed)
  CRNA Pain Management Visit Note  Patient: Joyce Rice, 30 y.o., female  "Hello I am a member of the anesthesia team at [facility]. We have an anesthesia team available at all times to provide care throughout the hospital, including epidural management and anesthesia for C-section. I don't know your plan for the delivery whether it a natural birth, water birth, IV sedation, nitrous supplementation, doula or epidural, but we want to meet your pain goals."   1.Was your pain managed to your expectations on prior hospitalizations?   Yes   2.What is your expectation for pain management during this hospitalization?     Epidural  3.How can we help you reach that goal? epidural  Record the patient's initial score and the patient's pain goal.   Pain: 0  Pain Goal: 4 [facility] wants you to be able to say your pain was always managed very well.  Edison Pace 03/10/2016

## 2016-03-10 NOTE — MAU Note (Signed)
Contractions all night. Scant bleeding. Was seen in MAU yesterday and was examined 4 times.

## 2016-03-10 NOTE — Lactation Note (Signed)
This note was copied from a baby's chart. Lactation Consultation Note Initial visit at 8 hours of age.  Mom reports baby has not had a feeding yet, she has attempted with just a few licks and learning.  LC assisted with hand expression mom has normal breasts with everted nipples.  A few drops of colostrum noted.  LC assisted with cross cradle hold on left breast.  Baby latched well with wide gape and strong rhythmic sucking.  Mom first yelled and felt like baby was biting.  Mom quickly became more comfortable and relaxed with feeding.  Baby maintained feeding for about 5 minutes and then released nipple and laid STS with mom.  [facility] LC resources given and discussed.  Encouraged to feed with early cues on demand.  Early newborn behavior discussed. FOB at bedside and supportive.   Mom to call for assist as needed.    Patient Name: Joyce Rice [id number] Date: 03/10/2016 Reason for consult: Initial assessment   Maternal Data Has patient been taught Hand Expression?: Yes Does the patient have breastfeeding experience prior to this delivery?: No  Feeding Feeding Type: Breast Fed Length of feed: 5 min  LATCH Score/Interventions Latch: Grasps breast easily, tongue down, lips flanged, rhythmical sucking. Intervention(s): Skin to skin;Teach feeding cues;Waking techniques  Audible Swallowing: A few with stimulation Intervention(s): Skin to skin;Hand expression Intervention(s): Skin to skin;Hand expression;Alternate breast massage  Type of Nipple: Everted at rest and after stimulation  Comfort (Breast/Nipple): Soft / non-tender     Hold (Positioning): Assistance needed to correctly position infant at breast and maintain latch. Intervention(s): Breastfeeding basics reviewed;Support Pillows;Position options;Skin to skin  LATCH Score: 8  Lactation Tools Discussed/Used WIC Program: Yes   Consult Status Consult Status: Follow-up Date: 03/11/16 Follow-up type: In-patient    [place],  Arvella Merles 03/10/2016, 9:19 PM

## 2016-03-10 NOTE — Anesthesia Preprocedure Evaluation (Signed)
Anesthesia Evaluation  Patient identified by MRN, date of birth, ID band Patient awake    Reviewed: Allergy & Precautions, H&P , NPO status , Patient's Chart, lab work & pertinent test results  Airway Mallampati: I  TM Distance: >3 FB Neck ROM: full    Dental no notable dental hx.    Pulmonary neg pulmonary ROS,    Pulmonary exam normal        Cardiovascular negative cardio ROS Normal cardiovascular exam     Neuro/Psych negative psych ROS   GI/Hepatic negative GI ROS, Neg liver ROS,   Endo/Other  negative endocrine ROS  Renal/GU negative Renal ROS  negative genitourinary   Musculoskeletal negative musculoskeletal ROS (+)   Abdominal Normal abdominal exam  (+)   Peds negative pediatric ROS (+)  Hematology negative hematology ROS (+)   Anesthesia Other Findings   Reproductive/Obstetrics (+) Pregnancy                             Anesthesia Physical Anesthesia Plan  ASA: II  Anesthesia Plan: Epidural   Post-op Pain Management:    Induction:   Airway Management Planned:   Additional Equipment:   Intra-op Plan:   Post-operative Plan:   Informed Consent: I have reviewed the patients History and Physical, chart, labs and discussed the procedure including the risks, benefits and alternatives for the proposed anesthesia with the patient or authorized representative who has indicated his/her understanding and acceptance.     Plan Discussed with:   Anesthesia Plan Comments:         Anesthesia Quick Evaluation

## 2016-03-10 NOTE — Anesthesia Procedure Notes (Signed)
Epidural Patient location during procedure: OB Start time: 03/10/2016 8:48 AM End time: 03/10/2016 8:52 AM  Staffing Anesthesiologist: Leilani Able  Preanesthetic Checklist Completed: patient identified, surgical consent, pre-op evaluation, timeout performed, IV checked, risks and benefits discussed and monitors and equipment checked  Epidural Patient position: sitting Prep: site prepped and draped and DuraPrep Patient monitoring: continuous pulse ox and blood pressure Approach: midline Location: L3-L4 Injection technique: LOR air  Needle:  Needle type: Tuohy  Needle gauge: 17 G Needle length: 9 cm and 9 Needle insertion depth: 5 cm cm Catheter type: closed end flexible Catheter size: 19 Gauge Catheter at skin depth: 10 cm Test dose: negative and Other  Assessment Sensory level: T9 Events: blood not aspirated, injection not painful, no injection resistance, negative IV test and no paresthesia  Additional Notes Reason for block:procedure for pain

## 2016-03-10 NOTE — Anesthesia Postprocedure Evaluation (Signed)
Anesthesia Post Note  Patient: Glass blower/designer  Procedure(s) Performed: * No procedures listed *  Patient location during evaluation: Mother Baby Anesthesia Type: Epidural Level of consciousness: awake, oriented, awake and alert and patient cooperative Pain management: pain level controlled Vital Signs Assessment: post-procedure vital signs reviewed and stable Respiratory status: spontaneous breathing, nonlabored ventilation and respiratory function stable Cardiovascular status: stable Postop Assessment: patient able to bend at knees, no headache, no backache and no signs of nausea or vomiting Anesthetic complications: no     Last Vitals:  Vitals:   03/10/16 1515 03/10/16 1621  BP: 107/62 (!) 106/59  Pulse: (!) 56 61  Resp:  16  Temp: 36.7 C 36.8 C    Last Pain:  Vitals:   03/10/16 1621  TempSrc: Oral  PainSc: 0-No pain   Pain Goal: Patients Stated Pain Goal: 3 (03/10/16 0803)               Letizia Hook L

## 2016-03-10 NOTE — H&P (Signed)
[facility] LABOR AND DELIVERY ADMISSION HISTORY AND PHYSICAL NOTE  Joyce Rice is a 30 y.o. female [phone] with IUP at [redacted]w[redacted]d by LMP presenting labor management.  Went to MAU last night for contractions, received pain medications, and went home. She was uncomfortable all night and came back to the MAU early this morning.   She reports positive fetal movement, clear leakage & mucus plug and scant vaginal bleeding since yesterday.  Prenatal History/Complications:  Past Medical History: Past Medical History:  Diagnosis Date  . Bartholin cyst   . Chlamydia   . Chlamydia 12/29/2011   Azithromycin given 12/29/11, vomited. Partner Tx per pt. Rx Doxy 01/29/12.   [facility] Headache   . Infection    UTI  . Irregular periods/menstrual cycles     Past Surgical History: Past Surgical History:  Procedure Laterality Date  . DILATION AND CURETTAGE OF UTERUS    . INDUCED ABORTION      Obstetrical History: OB History    Gravida Para Term Preterm AB Living   4 1 1  0 2 1   SAB TAB Ectopic Multiple Live Births   0 2 0 0        Social History: Social History   Social History  . Marital status: Single    Spouse name: N/A  . Number of children: N/A  . Years of education: N/A   Social History Main Topics  . Smoking status: Never Smoker  . Smokeless tobacco: Never Used  . Alcohol use No     Comment: once or twice a year  . Drug use: No  . Sexual activity: Yes    Birth control/ protection: None   Other Topics Concern  . None   Social History Narrative  . None    Family History: Family History  Problem Relation Age of Onset  . Hearing loss Neg Hx     Allergies: No Known Allergies  Prescriptions Prior to Admission  Medication Sig Dispense Refill Last Dose  . ferrous sulfate (FERROUSUL) 325 (65 FE) MG tablet Take 1 tablet (325 mg total) by mouth 2 (two) times daily. 60 tablet 1 03/09/2016 at Unknown time  . Prenat w/o A Vit-FeFum-FePo-FA (CONCEPT OB) 130-92.4-1 MG CAPS Take 1 tablet by  mouth 1 day or 1 dose. (Patient taking differently: Take 1 tablet by mouth daily. ) 30 capsule 5 03/09/2016 at Unknown time  . Doxylamine-Pyridoxine (DICLEGIS) 10-10 MG TBEC Start with 2 tablets every evening. If symptoms persist, add 1 tablet every morning. If symptoms persist, add 1 tablet at mid-day. (Patient not taking: Reported on 10/01/2015) 60 tablet 0 More than a month at Unknown time  . metroNIDAZOLE (FLAGYL) 500 MG tablet Take 1 tablet (500 mg total) by mouth 2 (two) times daily. (Patient not taking: Reported on 03/05/2016) 14 tablet 0 More than a month at Unknown time  . Misc. Devices (BREAST PUMP) MISC 1 Device by Does not apply route as directed. (Patient not taking: Reported on 03/05/2016) 1 each 0 Not Taking  . promethazine (PHENERGAN) 25 MG suppository Place 1 suppository (25 mg total) rectally every 6 (six) hours as needed for nausea or vomiting. (Patient not taking: Reported on 11/20/2015) 12 each 0 More than a month at Unknown time     Review of Systems   Denies: vision changes, trouble breathing, heart racing, vomiting, unusual swelling Endorses HA in forehead & neck, nausea  Blood pressure (!) 98/51, pulse (!) 59, temperature 98.5 F (36.9 C), temperature source Oral, resp. rate 18, height   (1.6 m), weight 170 lb (77.1 kg), last menstrual period 06/05/2015, SpO2 99 %, unknown if currently breastfeeding.   General appearance: cooperative, fatigued and no distress Lungs: clear to auscultation bilaterally Heart: regular rate and rhythm Abdomen: soft, non-tender;  Extremities: No calf swelling or tenderness  Presentation: vertex Fetal monitoring: Fetal Heart Rate A  Mode External filed at 03/10/2016 0803  Baseline Rate (A) 145 bpm filed at 03/10/2016 1029  Variability 6-25 BPM filed at 03/10/2016 1029  Accelerations 15 x 15 filed at 03/10/2016 1029  Decelerations None filed at 03/10/2016 1029    Uterine activity: Moderate ctx 2-5 min, 60-120 sec each  Dilation:  6 Effacement (%): 100 Station: -2 Exam by:: H Koran RNC  Membranes still in tact  Prenatal labs: ABO, Rh: O/NEG/-- (04/05 1155) Antibody: NEG (04/05 1155) Rubella: !Error! RPR: NON REAC (05/19 1024)  HBsAg: NEGATIVE (04/05 1155)  HIV: NONREACTIVE (05/19 1024)  GBS: Negative (07/25 0000)  1 hr Glucola: pt reports normal,  Genetic screening: negative Anatomy US: "short femur", pt reports short stature in family  Prenatal Transfer Tool  Maternal Diabetes: No Genetic Screening: Normal Maternal Ultrasounds/Referrals: Normal Fetal Ultrasounds or other Referrals:  None Maternal Substance Abuse:  No Significant Maternal Medications:  See above Significant Maternal Lab Results:   Results for orders placed or performed during the hospital encounter of 03/10/16 (from the past 24 hour(s))  OB RESULT CONSOLE Group B Strep   Collection Time: 03/10/16 12:00 AM  Result Value Ref Range   GBS Negative   Group B strep by PCR   Collection Time: 03/10/16  7:39 AM  Result Value Ref Range   Group B strep by PCR NEGATIVE NEGATIVE  CBC   Collection Time: 03/10/16  8:05 AM  Result Value Ref Range   WBC 12.3 (H) 4.0 - 10.5 K/uL   RBC 3.98 3.87 - 5.11 MIL/uL   Hemoglobin 12.4 12.0 - 15.0 g/dL   HCT 16.1 (L) 09.6 - 04.5 %   MCV 89.2 78.0 - 100.0 fL   MCH 31.2 26.0 - 34.0 pg   MCHC 34.9 30.0 - 36.0 g/dL   RDW 40.9 81.1 - 91.4 %   Platelets 201 150 - 400 K/uL    Patient Active Problem List   Diagnosis Date Noted  . Active labor at term 03/10/2016  . 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 Rh negative status during pregnancy in second trimester, antepartum 10/01/2015  . Supervision of normal subsequent pregnancy 10/01/2015  . Nausea and vomiting during pregnancy prior to [redacted] weeks gestation 10/01/2015    Assessment: Joyce Rice is a healthy 30 y.o. [id number] at [redacted]w[redacted]d here for SOL and active labor management.  #Labor: SOL, expectant  management #Pain: Epidural #FWB:145 baseline, moderate variability,  #ID:  GBS neg #MOF: breast #MOC: tubal ligations, has not signed papers yet #Circ:  Boy, circ out pt  Andres Ege, MD, PGY-1, MPH 03/10/2016, 10:34 AM   OB FELLOW HISTORY AND PHYSICAL ATTESTATION  I have seen and examined this patient; I agree with above documentation in the resident's note.    Ernestina Penna 03/10/2016, 3:16 PM

## 2016-03-10 NOTE — Telephone Encounter (Signed)
Received call on nurse voicemail from Friday 03/06/16 at 1244 pm.  Patient states she was given a prescription for a breast pump.  She took the prescription to [facility].  She says they referred her to [facility].  She said they don't carry breast pumps.  Patient wants to know what to do and where to get breast pump.

## 2016-03-11 ENCOUNTER — Encounter (HOSPITAL_COMMUNITY): Payer: Self-pay | Admitting: Family Medicine

## 2016-03-11 ENCOUNTER — Ambulatory Visit (HOSPITAL_COMMUNITY): Payer: Medicaid Other

## 2016-03-11 ENCOUNTER — Encounter: Payer: Medicaid Other | Admitting: Family

## 2016-03-11 LAB — TYPE AND SCREEN
ABO/RH(D): O NEG
ANTIBODY SCREEN: POSITIVE
DAT, IGG: NEGATIVE
UNIT DIVISION: 0
UNIT DIVISION: 0

## 2016-03-11 MED ORDER — OXYCODONE-ACETAMINOPHEN 5-325 MG PO TABS
1.0000 | ORAL_TABLET | Freq: Four times a day (QID) | ORAL | Status: DC | PRN
  Administered 2016-03-11 – 2016-03-12 (×3): 1 via ORAL
  Filled 2016-03-11 (×3): qty 1

## 2016-03-11 MED ORDER — IBUPROFEN 600 MG PO TABS: 600.0000 mg | ORAL_TABLET | Freq: Four times a day (QID) | ORAL | 0 refills | Status: DC

## 2016-03-11 MED ORDER — RHO D IMMUNE GLOBULIN 1500 UNIT/2ML IJ SOSY
300.0000 ug | PREFILLED_SYRINGE | Freq: Once | INTRAMUSCULAR | Status: AC
  Administered 2016-03-11: 300 ug via INTRAVENOUS
  Filled 2016-03-11: qty 2

## 2016-03-11 NOTE — Progress Notes (Signed)
Joyce Rice complains of pain in her abdomen and reports feeling a firm area in her right lower quadrant of her abdomen. Joyce Rice had a bowel movement within the hour. Abdomen assessed and Joyce Rice pulls back and complains of tenderness when attempting to assess abdomen for presenting complaint. Abdomen soft and fundus firm. Oral temperature is 98.2. Dr. Holly Bodily notified of Joyce Rice's abdominal discomfort and will come to evaluate Joyce Rice.

## 2016-03-11 NOTE — Progress Notes (Signed)
S: Ms. Joyce Rice is seen for evaluation of pain PPD1 of SVD and manual placental extraction. Ms. Joyce Rice reports that her pain is not adequately treated with acetaminophen and ibuprofen and requests more pain medication. She states that the pain is not improved with position change and is made worse with pressure on the lower abdomen. Pt denies fever/chills or inappropriate vaginal bleeding.  O:  Vitals:   03/11/16 0535 03/11/16 1738  BP: (!) 103/59 108/65  Pulse: (!) 57 85  Resp: 18 18  Temp: 98.6 F (37 C) 98.3 F (36.8 C)   On exam Ms. Joyce Rice rests comfortably in bed with baby and family in attendance. Notably she is very reluctant to have me palpate her abdomen. She prefers if no one touches her abdomen as she is "very sensitive" to touch. With reluctance she allows me to examine her lower abdomen and findings include a firm fundus and no rebound tenderness.  A&P: Ms. Joyce Rice is most likely having pain related to the manual placental extraction, though infection is possible. Given no elevated temperatures or other constitutional symptoms, infection is less likely. Percocet 5-325 will be available to her q 6hrs prn until discharge (anticipated tomorrow).  Andres Ege, MD, PGY-1, MPH

## 2016-03-11 NOTE — Discharge Instructions (Signed)

## 2016-03-11 NOTE — Discharge Summary (Signed)
Obstetric Discharge Summary Reason for Admission: onset of labor and 39.6 weeks Prenatal Procedures: ultrasound Intrapartum Procedures: spontaneous vaginal delivery Postpartum Procedures: Rho(D) Ig Complications-Operative and Postpartum: none Hemoglobin  Date Value Ref Range Status  03/10/2016 12.4 12.0 - 15.0 g/dL Final   HCT  Date Value Ref Range Status  03/10/2016 35.5 (L) 36.0 - 46.0 % Final    Physical Exam:  General: alert, cooperative and no distress Lochia: appropriate Uterine Fundus: firm DVT Evaluation: No evidence of DVT seen on physical exam. Negative Homan's sign. No cords or calf tenderness. No significant calf/ankle edema.  Discharge Diagnoses: Term Pregnancy-delivered  Discharge Information: Date: 03/11/2016 Activity: pelvic rest Diet: routine Medications: PNV and Ibuprofen Condition: stable Instructions: refer to practice specific booklet Discharge to: home Follow-up Information    [facility] Follow up in 2 week(s).   Specialty:  Obstetrics and Gynecology Why:  to schedule tubal Contact information: [address] [place] [address] [phone]          Newborn Data: Live born female  Birth Weight: 7 lb 4.8 oz (3310 g) APGAR: 9, 9  Home with mother.  Donette Larry 03/11/2016, 8:13 AM

## 2016-03-12 LAB — RH IG WORKUP (INCLUDES ABO/RH)
ABO/RH(D): O NEG
FETAL SCREEN: NEGATIVE
Gestational Age(Wks): 39.6
UNIT DIVISION: 0

## 2016-03-12 NOTE — Lactation Note (Signed)
This note was copied from a baby's chart. Lactation Consultation Note Mom is hoping baby pt. Will be d/c home today. Waiting on Bili serum. Mom has been doing Breast/formula. States she is waiting on her milk to come in, then she isn't going to give formula any more. Mom doesn't seem to interested in more education on BF. Mom holding baby STS, encouraged to cont. STS and I&O after d/c home. OP services reviewed, engorgement prevention, supply and demand. Mom has WIC. This is moms 2nd child, didn't BF her first child who didn't BF her 1st child. Encouraged to BF first before giving formula.  Patient Name: Boy Joscelin Litke [id number] Date: 03/12/2016 Reason for consult: Follow-up assessment   Maternal Data    Feeding Feeding Type: Breast Milk with Formula added Nipple Type: Slow - flow Length of feed: 15 min  LATCH Score/Interventions Latch: Grasps breast easily, tongue down, lips flanged, rhythmical sucking. Intervention(s): Teach feeding cues;Skin to skin  Audible Swallowing: Spontaneous and intermittent Intervention(s): Hand expression;Skin to skin Intervention(s): Skin to skin;Hand expression;Alternate breast massage  Type of Nipple: Everted at rest and after stimulation  Comfort (Breast/Nipple): Soft / non-tender     Hold (Positioning): No assistance needed to correctly position infant at breast. Intervention(s): Skin to skin;Position options;Support Pillows;Breastfeeding basics reviewed  LATCH Score: 10  Lactation Tools Discussed/Used     Consult Status Consult Status: Complete Date: 03/12/16    Charyl Dancer 03/12/2016, 6:37 AM

## 2016-03-12 NOTE — Discharge Summary (Signed)
   Obstetric Discharge Summary Reason for Admission: onset of labor and 39.6 weeks Prenatal Procedures: ultrasound Intrapartum Procedures: spontaneous vaginal delivery Postpartum Procedures: none Complications-Operative and Postpartum: manual placental removal LastLabs       Hemoglobin  Date Value Ref Range Status  03/10/2016 12.4 12.0 - 15.0 g/dL Final        HCT  Date Value Ref Range Status  03/10/2016 35.5 (L) 36.0 - 46.0 % Final      Physical Exam:  General: alert, cooperative and no distress Lochia: appropriate Uterine Fundus: firm DVT Evaluation: No evidence of DVT seen on physical exam. Negative Homan's sign. No cords or calf tenderness. No significant calf/ankle edema.  Discharge Diagnoses: Term Pregnancy-delivered  Discharge Information: Date: 03/12/2016 Activity: pelvic rest Diet: routine Medications: PNV and Ibuprofen Condition: stable Instructions: refer to practice specific booklet Discharge to: home    Follow-up Information    [facility] Follow up in 2 week(s).   Specialty:  Obstetrics and Gynecology Why:  to schedule tubal Contact information: [address] [place] [address] [phone]          Newborn Data: Live born female  Birth Weight: 7 lb 4.8 oz (3310 g) APGAR: 9, 9  Home with mother.  Loni Muse, MD  CNM attestation I have seen and examined this patient and agree with above documentation in the resident's note.   Leaetta Armagost is a 30 y.o. [id number] s/p SVD.   Pain is well controlled.  Plan for birth control is bilateral tubal ligation.  Method of Feeding: both  PE:  BP (!) 107/58 (BP Location: Right Arm)   Pulse (!) 55   Temp 98.3 F (36.8 C) (Oral)   Resp 18   Ht 5\' 3"  (1.6 m)   Wt 77.1 kg (170 lb)   LMP 06/05/2015   SpO2 99%   Breastfeeding? Unknown   BMI 30.11 kg/m  Fundus firm   Recent Labs  03/10/16 0805  HGB 12.4  HCT 35.5*     Plan:  discharge today - postpartum care discussed - f/u clinic in 2 weeks for pre-op visit for interval Shari Heritage, CNM 7:44 AM  03/12/2016

## 2016-03-19 ENCOUNTER — Encounter: Payer: Medicaid Other | Admitting: Medical

## 2016-03-20 ENCOUNTER — Inpatient Hospital Stay (HOSPITAL_COMMUNITY)
Admission: AD | Admit: 2016-03-20 | Discharge: 2016-03-20 | Disposition: A | Discharge status: Home / Self Care | Payer: Medicaid Other | Source: Ambulatory Visit | Attending: Obstetrics & Gynecology | Admitting: Obstetrics & Gynecology

## 2016-03-20 ENCOUNTER — Encounter (HOSPITAL_COMMUNITY): Payer: Self-pay | Admitting: *Deleted

## 2016-03-20 DIAGNOSIS — O9089 Other complications of the puerperium, not elsewhere classified: Secondary | ICD-10-CM

## 2016-03-20 DIAGNOSIS — R52 Pain, unspecified: Secondary | ICD-10-CM

## 2016-03-20 DIAGNOSIS — N3001 Acute cystitis with hematuria: Secondary | ICD-10-CM

## 2016-03-20 DIAGNOSIS — R1030 Lower abdominal pain, unspecified: Secondary | ICD-10-CM | POA: Insufficient documentation

## 2016-03-20 DIAGNOSIS — N390 Urinary tract infection, site not specified: Secondary | ICD-10-CM | POA: Diagnosis not present

## 2016-03-20 DIAGNOSIS — Z79899 Other long term (current) drug therapy: Secondary | ICD-10-CM | POA: Insufficient documentation

## 2016-03-20 DIAGNOSIS — M549 Dorsalgia, unspecified: Secondary | ICD-10-CM | POA: Diagnosis present

## 2016-03-20 DIAGNOSIS — R112 Nausea with vomiting, unspecified: Secondary | ICD-10-CM | POA: Insufficient documentation

## 2016-03-20 LAB — CBC WITH DIFFERENTIAL/PLATELET
Basophils Absolute: 0 10*3/uL (ref 0.0–0.1)
Basophils Relative: 0 %
Eosinophils Absolute: 0.1 10*3/uL (ref 0.0–0.7)
Eosinophils Relative: 1 %
HEMATOCRIT: 36.7 % (ref 36.0–46.0)
Hemoglobin: 12.4 g/dL (ref 12.0–15.0)
LYMPHS PCT: 28 %
Lymphs Abs: 2.3 10*3/uL (ref 0.7–4.0)
MCH: 30.4 pg (ref 26.0–34.0)
MCHC: 33.8 g/dL (ref 30.0–36.0)
MCV: 90 fL (ref 78.0–100.0)
MONO ABS: 0.4 10*3/uL (ref 0.1–1.0)
MONOS PCT: 5 %
NEUTROS ABS: 5.6 10*3/uL (ref 1.7–7.7)
Neutrophils Relative %: 66 %
Platelets: 321 10*3/uL (ref 150–400)
RBC: 4.08 MIL/uL (ref 3.87–5.11)
RDW: 12.6 % (ref 11.5–15.5)
WBC: 8.4 10*3/uL (ref 4.0–10.5)

## 2016-03-20 LAB — URINALYSIS, ROUTINE W REFLEX MICROSCOPIC
Bilirubin Urine: NEGATIVE
GLUCOSE, UA: NEGATIVE mg/dL
KETONES UR: NEGATIVE mg/dL
Nitrite: NEGATIVE
PH: 6 (ref 5.0–8.0)
Protein, ur: NEGATIVE mg/dL
Specific Gravity, Urine: <1.005 — ABNORMAL LOW (ref 1.005–1.030)

## 2016-03-20 LAB — URINE MICROSCOPIC-ADD ON

## 2016-03-20 MED ORDER — SODIUM CHLORIDE 0.9 % IV SOLN
INTRAVENOUS | Status: DC
  Administered 2016-03-20: 18:00:00 via INTRAVENOUS

## 2016-03-20 MED ORDER — HYDROMORPHONE HCL 1 MG/ML IJ SOLN
0.5000 mg | Freq: Once | INTRAMUSCULAR | Status: AC
  Administered 2016-03-20: 0.5 mg via INTRAVENOUS
  Filled 2016-03-20: qty 1

## 2016-03-20 MED ORDER — TRAMADOL HCL 50 MG PO TABS: 50.0000 mg | ORAL_TABLET | Freq: Four times a day (QID) | ORAL | 0 refills | Status: DC | PRN

## 2016-03-20 MED ORDER — SULFAMETHOXAZOLE-TRIMETHOPRIM 800-160 MG PO TABS: 1.0000 | ORAL_TABLET | Freq: Two times a day (BID) | ORAL | 0 refills | Status: DC

## 2016-03-20 MED ORDER — PROMETHAZINE HCL 25 MG/ML IJ SOLN
12.5000 mg | Freq: Once | INTRAMUSCULAR | Status: AC
  Administered 2016-03-20: 12.5 mg via INTRAVENOUS
  Filled 2016-03-20: qty 1

## 2016-03-20 MED ORDER — PROMETHAZINE HCL 25 MG PO TABS: 25.0000 mg | ORAL_TABLET | Freq: Four times a day (QID) | ORAL | 0 refills | Status: DC | PRN

## 2016-03-20 MED ORDER — FLUCONAZOLE 150 MG PO TABS: 150.0000 mg | ORAL_TABLET | Freq: Once | ORAL | 1 refills | Status: AC

## 2016-03-20 MED ORDER — DEXTROSE IN LACTATED RINGERS 5 % IV SOLN
INTRAVENOUS | Status: DC
  Administered 2016-03-20: 19:00:00 via INTRAVENOUS

## 2016-03-20 NOTE — MAU Note (Signed)
Delivered 03-10-16 vaginally.  Pt reports they had to manually clean her out after the placenta.  Reports sharp pains in back but is progressively getting worse since Monday.  Using Motrin and heating pad. Vomiting started x 2 days ago.  Pt is currently breast/formula feeding.

## 2016-03-20 NOTE — Discharge Instructions (Signed)
Nausea and Vomiting °Nausea is a sick feeling that often comes before throwing up (vomiting). Vomiting is a reflex where stomach contents come out of your mouth. Vomiting can cause severe loss of body fluids (dehydration). Children and elderly adults can become dehydrated quickly, especially if they also have diarrhea. Nausea and vomiting are symptoms of a condition or disease. It is important to find the cause of your symptoms. °CAUSES  °· Direct irritation of the stomach lining. This irritation can result from increased acid production (gastroesophageal reflux disease), infection, food poisoning, taking certain medicines (such as nonsteroidal anti-inflammatory drugs), alcohol use, or tobacco use. °· Signals from the brain. These signals could be caused by a headache, heat exposure, an inner ear disturbance, increased pressure in the brain from injury, infection, a tumor, or a concussion, pain, emotional stimulus, or metabolic problems. °· An obstruction in the gastrointestinal tract (bowel obstruction). °· Illnesses such as diabetes, hepatitis, gallbladder problems, appendicitis, kidney problems, cancer, sepsis, atypical symptoms of a heart attack, or eating disorders. °· Medical treatments such as chemotherapy and radiation. °· Receiving medicine that makes you sleep (general anesthetic) during surgery. °DIAGNOSIS °Your caregiver may ask for tests to be done if the problems do not improve after a few days. Tests may also be done if symptoms are severe or if the reason for the nausea and vomiting is not clear. Tests may include: °· Urine tests. °· Blood tests. °· Stool tests. °· Cultures (to look for evidence of infection). °· X-rays or other imaging studies. °Test results can help your caregiver make decisions about treatment or the need for additional tests. °TREATMENT °You need to stay well hydrated. Drink frequently but in small amounts. You may wish to drink water, sports drinks, clear broth, or eat frozen  ice pops or gelatin dessert to help stay hydrated. When you eat, eating slowly may help prevent nausea. There are also some antinausea medicines that may help prevent nausea. °HOME CARE INSTRUCTIONS  °· Take all medicine as directed by your caregiver. °· If you do not have an appetite, do not force yourself to eat. However, you must continue to drink fluids. °· If you have an appetite, eat a normal diet unless your caregiver tells you differently. °· Eat a variety of complex carbohydrates (rice, wheat, potatoes, bread), lean meats, yogurt, fruits, and vegetables. °· Avoid high-fat foods because they are more difficult to digest. °· Drink enough water and fluids to keep your urine clear or pale yellow. °· If you are dehydrated, ask your caregiver for specific rehydration instructions. Signs of dehydration may include: °· Severe thirst. °· Dry lips and mouth. °· Dizziness. °· Dark urine. °· Decreasing urine frequency and amount. °· Confusion. °· Rapid breathing or pulse. °SEEK IMMEDIATE MEDICAL CARE IF:  °· You have blood or brown flecks (like coffee grounds) in your vomit. °· You have black or bloody stools. °· You have a severe headache or stiff neck. °· You are confused. °· You have severe abdominal pain. °· You have chest pain or trouble breathing. °· You do not urinate at least once every 8 hours. °· You develop cold or clammy skin. °· You continue to vomit for longer than 24 to 48 hours. °· You have a fever. °MAKE SURE YOU:  °· Understand these instructions. °· Will watch your condition. °· Will get help right away if you are not doing well or get worse. °  °This information is not intended to replace advice given to you by your health care provider. Make sure   you discuss any questions you have with your health care provider. °  °Document Released: 08/03/2005 Document Revised: 10/26/2011 Document Reviewed: 12/31/2010 °Elsevier Interactive Patient Education ©2016 Else[employer] Inc[employer] ° °Urinary Tract Infection °Urinary  tract infections (UTIs) can develop anywhere along your urinary tract. Your urinary tract is your body's drainage system for removing wastes and extra water. Your urinary tract includes two kidneys, two ureters, a bladder, and a urethra. Your kidneys are a pair of bean-shaped organs. Each kidney is about the size of your fist. They are located below your ribs, one on each side of your spine. °CAUSES °Infections are caused by microbes, which are microscopic organisms, including fungi, viruses, and bacteria. These organisms are so small that they can only be seen through a microscope. Bacteria are the microbes that most commonly cause UTIs. °SYMPTOMS  °Symptoms of UTIs may vary by age and gender of the patient and by the location of the infection. Symptoms in young women typically include a frequent and intense urge to urinate and a painful, burning feeling in the bladder or urethra during urination. Older women and men are more likely to be tired, shaky, and weak and have muscle aches and abdominal pain. A fever may mean the infection is in your kidneys. Other symptoms of a kidney infection include pain in your back or sides below the ribs, nausea, and vomiting. °DIAGNOSIS °To diagnose a UTI, your caregiver will ask you about your symptoms. Your caregiver will also ask you to provide a urine sample. The urine sample will be tested for bacteria and white blood cells. White blood cells are made by your body to help fight infection. °TREATMENT  °Typically, UTIs can be treated with medication. Because most UTIs are caused by a bacterial infection, they usually can be treated with the use of antibiotics. The choice of antibiotic and length of treatment depend on your symptoms and the type of bacteria causing your infection. °HOME CARE INSTRUCTIONS °· If you were prescribed antibiotics, take them exactly as your caregiver instructs you. Finish the medication even if you feel better after you have only taken some of the  medication. °· Drink enough water and fluids to keep your urine clear or pale yellow. °· Avoid caffeine, tea, and carbonated beverages. They tend to irritate your bladder. °· Empty your bladder often. Avoid holding urine for long periods of time. °· Empty your bladder before and after sexual intercourse. °· After a bowel movement, women should cleanse from front to back. Use each tissue only once. °SEEK MEDICAL CARE IF:  °· You have back pain. °· You develop a fever. °· Your symptoms do not begin to resolve within 3 days. °SEEK IMMEDIATE MEDICAL CARE IF:  °· You have severe back pain or lower abdominal pain. °· You develop chills. °· You have nausea or vomiting. °· You have continued burning or discomfort with urination. °MAKE SURE YOU:  °· Understand these instructions. °· Will watch your condition. °· Will get help right away if you are not doing well or get worse. °  °This information is not intended to replace advice given to you by your health care provider. Make sure you discuss any questions you have with your health care provider. °  °Document Released: 05/13/2005 Document Revised: 04/24/2015 Document Reviewed: 09/11/2011 °Elsevier Interactive Patient Education ©2016 Elsevier Inc. ° ° °

## 2016-03-20 NOTE — MAU Provider Note (Signed)
Chief Complaint:  Back Pain and Emesis   First Provider Initiated Contact with Patient 03/20/16 1734      Joyce Rice is a 30 y.o. [id number] who presents to maternity admissions reporting Lower abdominal pain since Monday. States has had pain since delivery, but worse this week.  Has had chills off and on, did not take temp.  Bleeding comes and goes.   Vomiting for the past 2 days.  States can't keep anything down.. She reports vaginal bleeding, no vaginal itching/burning, urinary symptoms, h/a, dizziness, n/v, or fever/chills.    Abdominal Pain  This is a recurrent problem. The current episode started in the past 7 days. The onset quality is gradual. The problem occurs intermittently. The problem has been unchanged. The pain is located in the LLQ, RLQ and suprapubic region. The pain is moderate. The quality of the pain is cramping and sharp. The abdominal pain does not radiate. Associated symptoms include nausea and vomiting. Pertinent negatives include no constipation, diarrhea, dysuria, fever or myalgias. Nothing aggravates the pain. The pain is relieved by nothing. Treatments tried: Motrin. The treatment provided no relief.   RN Note: Delivered 03-10-16 vaginally.  Pt reports they had to manually clean her out after the placenta.  Reports sharp pains in back but is progressively getting worse since Monday.  Using Motrin and heating pad. Vomiting started x 2 days ago.  Pt is currently breast/formula feeding  Past Medical History: Past Medical History:  Diagnosis Date  . Bartholin cyst   . Chlamydia   . Chlamydia 12/29/2011   Azithromycin given 12/29/11, vomited. Partner Tx per pt. Rx Doxy 01/29/12.   Marland Kitchen Headache   . Infection    UTI  . Irregular periods/menstrual cycles     Past obstetric history: OB History  Gravida Para Term Preterm AB Living  4 2 2  0 2 2  SAB TAB Ectopic Multiple Live Births  0 2 0 0 1    # Outcome Date GA Lbr Len/2nd Weight Sex Delivery Anes PTL Lv  4 Term  03/10/16 [redacted]w[redacted]d 12:14 / 00:53 7 lb 4.8 oz (3.31 kg) M Vag-Spont EPI  LIV  3 TAB 09/18/10          2 Term 11/21/08    F Vag-Spont        Birth Comments: System Generated. Please review and update pregnancy details.  1 TAB               Past Surgical History: Past Surgical History:  Procedure Laterality Date  . DILATION AND CURETTAGE OF UTERUS    . INDUCED ABORTION      Family History: Family History  Problem Relation Age of Onset  . Hearing loss Neg Hx     Social History: Social History  Substance Use Topics  . Smoking status: Never Smoker  . Smokeless tobacco: Never Used  . Alcohol use No     Comment: once or twice a year    Allergies: No Known Allergies  Meds:  Prescriptions Prior to Admission  Medication Sig Dispense Refill Last Dose  . ibuprofen (ADVIL,MOTRIN) 600 MG tablet Take 1 tablet (600 mg total) by mouth every 6 (six) hours. 30 tablet 0 Past Week at Unknown time  . Prenat w/o A Vit-FeFum-FePo-FA (CONCEPT OB) 130-92.4-1 MG CAPS Take 1 tablet by mouth 1 day or 1 dose. (Patient taking differently: Take 1 tablet by mouth daily. ) 30 capsule 5 03/20/2016 at Unknown time    I have reviewed patient's Past Medical  Hx, Surgical Hx, Family Hx, Social Hx, medications and allergies.  ROS:  Review of Systems  Constitutional: Negative for fever.  Gastrointestinal: Positive for abdominal pain, nausea and vomiting. Negative for constipation and diarrhea.  Genitourinary: Negative for dysuria.  Musculoskeletal: Negative for myalgias.   Other systems negative     Physical Exam  Patient Vitals for the past 24 hrs:  BP Temp Temp src Pulse Resp SpO2  03/20/16 1725 121/78 98.2 F (36.8 C) Oral (!) 49 18 100 %   Constitutional: Well-developed, well-nourished female in no acute distress.  Cardiovascular: normal rate and rhythm, no ectopy audible, S1 & S2 heard, no murmur Respiratory: normal effort, no distress. Lungs CTAB with no wheezes or crackles GI: Abd soft, tender  over lower abdomen.   No rebound, No guarding.  Bowel Sounds audible  MS: Extremities nontender, no edema, normal ROM Neurologic: Alert and oriented x 4.   Grossly nonfocal. GU: Neg CVAT. Skin:  Warm and Dry Psych:  Affect appropriate.  PELVIC EXAM: refused, no bleeding at all   Labs: Results for orders placed or performed during the hospital encounter of 03/20/16 (from the past 24 hour(s))  Urinalysis, Routine w reflex microscopic (not at [facility])     Status: Abnormal   Collection Time: 03/20/16  5:15 PM  Result Value Ref Range   Color, Urine YELLOW YELLOW   APPearance CLOUDY (A) CLEAR   Specific Gravity, Urine <1.005 (L) 1.005 - 1.030   pH 6.0 5.0 - 8.0   Glucose, UA NEGATIVE NEGATIVE mg/dL   Hgb urine dipstick LARGE (A) NEGATIVE   Bilirubin Urine NEGATIVE NEGATIVE   Ketones, ur NEGATIVE NEGATIVE mg/dL   Protein, ur NEGATIVE NEGATIVE mg/dL   Nitrite NEGATIVE NEGATIVE   Leukocytes, UA LARGE (A) NEGATIVE  Urine microscopic-add on     Status: Abnormal   Collection Time: 03/20/16  5:15 PM  Result Value Ref Range   Squamous Epithelial / LPF 0-5 (A) NONE SEEN   WBC, UA TOO NUMEROUS TO COUNT 0 - 5 WBC/hpf   RBC / HPF 0-5 0 - 5 RBC/hpf   Bacteria, UA FEW (A) NONE SEEN  CBC with Differential/Platelet     Status: None   Collection Time: 03/20/16  6:10 PM  Result Value Ref Range   WBC 8.4 4.0 - 10.5 K/uL   RBC 4.08 3.87 - 5.11 MIL/uL   Hemoglobin 12.4 12.0 - 15.0 g/dL   HCT 78.2 95.6 - 21.3 %   MCV 90.0 78.0 - 100.0 fL   MCH 30.4 26.0 - 34.0 pg   MCHC 33.8 30.0 - 36.0 g/dL   RDW 08.6 57.8 - 46.9 %   Platelets 321 150 - 400 K/uL   Neutrophils Relative % 66 %   Neutro Abs 5.6 1.7 - 7.7 K/uL   Lymphocytes Relative 28 %   Lymphs Abs 2.3 0.7 - 4.0 K/uL   Monocytes Relative 5 %   Monocytes Absolute 0.4 0.1 - 1.0 K/uL   Eosinophils Relative 1 %   Eosinophils Absolute 0.1 0.0 - 0.7 K/uL   Basophils Relative 0 %   Basophils Absolute 0.0 0.0 - 0.1 K/uL    Imaging:  No results  found.  MAU Course/MDM: I have ordered labs as follows: CBC, UA Imaging ordered: none Results reviewed. WBC is normal and that together with no increase in bleeding, casts doubt on endometritis as a diagnosis. UA is suggestive of infection. WIll send to culture and treat with short course Septra DS.  Pt requests Difluica  also. Will give small amount of phenergan and tramadol. Felt better after 2 liters of fluid.  Treatments in MAU included IV hydration, analgesia, antiemetic.   Pt stable at time of discharge.  Assessment: Postpartum abdominal pain No evidence for endometritis Probable UTI  Plan: Discharge home Recommend Push fluids Rx sent for Phenergan  for nausea Rx Tramadol for pain Rx Bactrim for UTI   Encouraged to return here or to other Urgent Care/ED if she develops worsening of symptoms, increase in pain, fever, or other concerning symptoms.   Wynelle Bourgeois CNM, MSN Certified Nurse-Midwife 03/20/2016 5:48 PM

## 2016-03-22 LAB — URINE CULTURE

## 2016-03-23 NOTE — Telephone Encounter (Signed)
Called pt and informed her that she needed to send the prescription to her insurance to be able to get approval for the breast pump.  I advised pt to look on the back of her Medicaid card for [phone] number to contact them in regards to receiving the breast pump.  Pt stated understanding with no further questions.

## 2016-03-25 ENCOUNTER — Telehealth: Payer: Self-pay | Admitting: *Deleted

## 2016-03-25 NOTE — Telephone Encounter (Signed)
Joyce LenisLaquanda called this afternoon and left a message wanting to know if it is safe to take tramadol, flagyl and ibuprofen since she is breastfeeding. Per discussion with provider it is not safe to take tramadol while breastfeeding. It is safe to take flagyl and ibuprofen.   I called [name] and left a message I was returning her call and we do want to discuss her question with her. Please call clinic.

## 2016-03-26 NOTE — Telephone Encounter (Signed)
Patient called and left message stating she is returning our call. Patient states if she doesn't answer, we can leave a message on her voicemail. Patient states she recently took flagyl but wants to know if tramadol, phenergan and ibuprofen is safe to use with breastfeeding. Called patient back, no answer- left message stating that the phenergan and ibuprofen is okay with breastfeeding but it is not advised to take tramadol with breastfeeding. If you have any questions you may call us back.

## 2016-03-27 ENCOUNTER — Other Ambulatory Visit: Payer: Self-pay

## 2016-03-27 MED ORDER — METRONIDAZOLE 500 MG PO TABS: 500.0000 mg | ORAL_TABLET | Freq: Two times a day (BID) | ORAL | 0 refills | Status: DC

## 2016-03-27 NOTE — Telephone Encounter (Signed)
Patient wanted to know is flagyl safe doing pregnancy if so she would like a rx called into [facility]. I advised patient that Flagyl is safe and I have called in a new rx for patient to take.

## 2016-04-10 ENCOUNTER — Ambulatory Visit: Payer: Medicaid Other | Admitting: Obstetrics and Gynecology

## 2016-05-05 ENCOUNTER — Telehealth: Payer: Self-pay | Admitting: *Deleted

## 2016-05-05 NOTE — Telephone Encounter (Signed)
Pt left message on 9/15 @ 1510 stating that she had her baby on 7/25 and had her first period on 9/1 which lasted about a week. She has started bleeding again and wants to know if this is normal. She requested a call back and message can be left on her voicemail. I called pt back today and left a message stating that irregular bleeding after having a baby can be normal. She should discuss further @ her next appt on 9/25 @ 1420.

## 2016-05-11 ENCOUNTER — Encounter: Payer: Self-pay | Admitting: Advanced Practice Midwife

## 2016-05-11 ENCOUNTER — Other Ambulatory Visit (HOSPITAL_COMMUNITY)
Admission: RE | Admit: 2016-05-11 | Discharge: 2016-05-11 | Disposition: A | Discharge status: Home / Self Care | Payer: Medicaid Other | Source: Ambulatory Visit | Attending: Advanced Practice Midwife | Admitting: Advanced Practice Midwife

## 2016-05-11 ENCOUNTER — Ambulatory Visit (INDEPENDENT_AMBULATORY_CARE_PROVIDER_SITE_OTHER): Payer: Medicaid Other | Admitting: Advanced Practice Midwife

## 2016-05-11 VITALS — BP 118/65 | HR 84 | Wt 154.0 lb

## 2016-05-11 DIAGNOSIS — Z113 Encounter for screening for infections with a predominantly sexual mode of transmission: Secondary | ICD-10-CM | POA: Diagnosis present

## 2016-05-11 DIAGNOSIS — B3731 Acute candidiasis of vulva and vagina: Secondary | ICD-10-CM

## 2016-05-11 DIAGNOSIS — Z3483 Encounter for supervision of other normal pregnancy, third trimester: Secondary | ICD-10-CM

## 2016-05-11 DIAGNOSIS — Z3042 Encounter for surveillance of injectable contraceptive: Secondary | ICD-10-CM

## 2016-05-11 DIAGNOSIS — B373 Candidiasis of vulva and vagina: Secondary | ICD-10-CM

## 2016-05-11 DIAGNOSIS — Z01818 Encounter for other preprocedural examination: Secondary | ICD-10-CM

## 2016-05-11 MED ORDER — FLUCONAZOLE 150 MG PO TABS: 150.0000 mg | ORAL_TABLET | ORAL | 0 refills | Status: DC

## 2016-05-11 NOTE — Progress Notes (Signed)
Subjective:     Joyce Rice is a 30 y.o. female who presents for a postpartum visit. She is 8 weeks postpartum following a spontaneous vaginal delivery. I have fully reviewed the prenatal and intrapartum course. The delivery was at 1126w6d gestational weeks. Outcome: spontaneous vaginal delivery. Anesthesia: epidural. Postpartum course has been remarkable for intermittent abdominal pain. Workup for endometritis was negative, but pt still worried "because they had to remove my placenta withtheir hands.  Still has some epigastric pain.. Baby's course has been uneventful. Baby is feeding by breast and bottle Similac Advance. Bleeding no bleeding. Bowel function is normal. Bladder function is normal. Patient is sexually active. Contraception method is wants BTL and wants Depoprovera prior to that.  Has had IC unprotected.  Discussed with Dr Alysia PennaErvin who states if she has a period next week, can get Depo. Postpartum depression screening: negative.  Has had some epigastric pain which moves around abdomen Frequent stools Vaginal itching and thick discharge Wants BTL and wants Depo Provera in the meantime The following portions of the patient's history were reviewed and updated as appropriate: allergies, current medications, past family history, past medical history, past social history, past surgical history and problem list.  Review of Systems Pertinent items are noted in HPI.   Objective:    There were no vitals taken for this visit.  General:  alert, cooperative and no distress   Breasts:  inspection negative, no nipple discharge or bleeding, no masses or nodularity palpable  Lungs: clear to auscultation bilaterally  Heart:  regular rate and rhythm, S1, S2 normal, no murmur, click, rub or gallop  Abdomen: soft, non-tender; bowel sounds normal; no masses,  no organomegaly   Vulva:  normal  Vagina: normal vagina  Cervix:  multiparous appearance  Corpus: normal size, contour, position, consistency,  mobility, non-tender  Adnexa:  no mass, fullness, tenderness  Rectal Exam: Not performed.        Assessment:     Normal postpartum exam. Pap smear not done at today's visit.   Plan:    1. Contraception: Depo-Provera injections and tubal ligation 2. Discussed contraception with Dr Alysia PennaErvin.  Patient really wants DepoProvera until she can get BTL. Message sent to schedule BTl.  He states as long as she has another period next week, and UPT neg, can get DepoProvera once period starts.   3. Follow up in: 1 week or as needed.   4.   Yeast vaginitis symptoms.  Wants to be tested for STDs and treated for yeast. Rx diflucan sent

## 2016-05-11 NOTE — Patient Instructions (Signed)
Laparoscopic Tubal Ligation Laparoscopic tubal ligation is a procedure that closes the fallopian tubes at a time other than right after childbirth. When the fallopian tubes are closed, the eggs that are released from the ovaries cannot enter the uterus, and sperm cannot reach the egg. Tubal ligation is also known as getting your "tubes tied." Tubal ligation is done so you will not be able to get pregnant or have a baby. Although this procedure may be undone (reversed), it should be considered permanent and irreversible. If you want to have future pregnancies, you should not have this procedure. LET YOUR HEALTH CARE PROVIDER KNOW ABOUT:  Any allergies you have.  All medicines you are taking, including vitamins, herbs, eye drops, creams, and over-the-counter medicines. This includes any use of steroids, either by mouth or in cream form.  Previous problems you or members of your family have had with the use of anesthetics.  Any blood disorders you have.  Previous surgeries you have had.  Any medical conditions you may have.  Possibility of pregnancy, if this applies.  Any past pregnancies. RISKS AND COMPLICATIONS  Infection.  Bleeding.  Injury to surrounding organs.  Side effects from anesthetics.  Failure of the procedure.  Ectopic pregnancy.  Future regret about having the procedure done. BEFORE THE PROCEDURE  Ask your health care provider about:  Changing or stopping your regular medicines. This is especially important if you are taking diabetes medicines or blood thinners.  Taking medicines such as aspirin and ibuprofen. These medicines can thin your blood. Do not take these medicines before your procedure if your health care provider instructs you not to.  Follow instructions from your health care provider about eating and drinking restrictions.  Plan to have someone take you home after the procedure.  If you go home right after the procedure, plan to have someone  with you for 24 hours. PROCEDURE  You will be given one or more of the following:  A medicine that helps you relax (sedative).  A medicine that numbs the area (local anesthetic).  A medicine that makes you fall asleep (general anesthetic).  A medicine that is injected into an area of your body that numbs everything below the injection site (regional anesthetic).  If you have been given general anesthetic, a tube will be put down your throat to help you breathe.  Two small cuts (incisions) will be made in the lower abdominal area and near the belly button.  Your bladder may be emptied with a small tube (catheter).  Your abdomen will be inflated with a safe gas (carbon dioxide). This will help to give the surgeon room to operate and visualize, and it will help the surgeon to avoid other organs.  A thin, lighted tube (laparoscope) with a camera attached will be inserted into your abdomen through one of the incisions near the belly button. Other small instruments will be inserted through the other abdominal incision.  The fallopian tubes will be tied off or burned (cauterized), or they will be blocked with a clip, ring, or clamp. In many cases, a small portion in the center of each fallopian tube will also be removed.  After the fallopian tubes are blocked, the gas will be released from the abdomen.  The incisions will be closed with stitches (sutures).  A bandage (dressing) will be placed over the incisions. The procedure may vary among health care providers and hospitals. AFTER THE PROCEDURE  Your blood pressure, heart rate, breathing rate, and blood oxygen level   will be monitored often until the medicines you were given have worn off.  You will be given pain medicine as needed.  If you had general anesthetic, you may have some mild discomfort in your throat. This is from the breathing tube that was placed in your throat while you were sleeping.  You may experience discomfort in  the shoulder area from some trapped air between your liver and your diaphragm. This sensation is normal, and it will slowly go away on its own.  You will have some mild abdominal discomfort for 3--7 days.   This information is not intended to replace advice given to you by your health care provider. Make sure you discuss any questions you have with your health care provider.   Document Released: 11/09/2000 Document Revised: 12/18/2014 Document Reviewed: 11/14/2011 Elsevier Interactive Patient Education 2016 [employer] Inc.  

## 2016-05-12 LAB — GC/CHLAMYDIA PROBE AMP (~~LOC~~) NOT AT ARMC
Chlamydia: NEGATIVE
NEISSERIA GONORRHEA: NEGATIVE

## 2016-05-12 LAB — WET PREP, GENITAL
TRICH WET PREP: NONE SEEN
Yeast Wet Prep HPF POC: NONE SEEN

## 2016-05-15 ENCOUNTER — Encounter (HOSPITAL_COMMUNITY): Payer: Self-pay | Admitting: *Deleted

## 2016-05-19 ENCOUNTER — Other Ambulatory Visit: Payer: Self-pay | Admitting: Obstetrics and Gynecology

## 2016-05-20 ENCOUNTER — Ambulatory Visit (INDEPENDENT_AMBULATORY_CARE_PROVIDER_SITE_OTHER): Payer: Medicaid Other | Admitting: *Deleted

## 2016-05-20 DIAGNOSIS — Z3042 Encounter for surveillance of injectable contraceptive: Secondary | ICD-10-CM

## 2016-05-20 LAB — POCT PREGNANCY, URINE: PREG TEST UR: NEGATIVE

## 2016-05-20 MED ORDER — MEDROXYPROGESTERONE ACETATE 150 MG/ML IM SUSP
150.0000 mg | Freq: Once | INTRAMUSCULAR | Status: AC
  Administered 2016-05-20: 150 mg via INTRAMUSCULAR

## 2016-05-21 NOTE — Patient Instructions (Signed)
Your procedure is scheduled on:  Tuesday, Oct. 10, 2017  [facility] at:  8:45 AM  Pick up the phone at the desk and dial [phone].  Call this number if you have problems the morning of surgery: [phone].  Remember: Do NOT eat food or drink after:  Midnight Monday  Take these medicines the morning of surgery with a SIP OF WATER:  None  Stop ALL herbal medications at this time   Do NOT wear jewelry (body piercing), metal hair clips/bobby pins, make-up, or nail polish. Do NOT wear lotions, powders, or perfumes.  You may wear deodorant. Do NOT shave for 48 hours prior to surgery. Do NOT bring valuables to the hospital. Contacts, dentures, or bridgework may not be worn into surgery.  Have a responsible adult drive you home and stay with you for 24 hours after your procedure

## 2016-05-22 ENCOUNTER — Encounter (HOSPITAL_COMMUNITY)
Admission: RE | Admit: 2016-05-22 | Discharge: 2016-05-22 | Disposition: A | Discharge status: Home / Self Care | Payer: Medicaid Other | Source: Ambulatory Visit | Attending: Obstetrics and Gynecology | Admitting: Obstetrics and Gynecology

## 2016-05-22 ENCOUNTER — Encounter (HOSPITAL_COMMUNITY): Payer: Self-pay

## 2016-05-22 DIAGNOSIS — Z01812 Encounter for preprocedural laboratory examination: Secondary | ICD-10-CM | POA: Diagnosis present

## 2016-05-22 HISTORY — DX: Nausea with vomiting, unspecified: R11.2

## 2016-05-22 HISTORY — DX: Other specified postprocedural states: Z98.890

## 2016-05-22 LAB — CBC
HCT: 37.1 % (ref 36.0–46.0)
HEMOGLOBIN: 13 g/dL (ref 12.0–15.0)
MCH: 30.7 pg (ref 26.0–34.0)
MCHC: 35 g/dL (ref 30.0–36.0)
MCV: 87.5 fL (ref 78.0–100.0)
Platelets: 248 10*3/uL (ref 150–400)
RBC: 4.24 MIL/uL (ref 3.87–5.11)
RDW: 12.3 % (ref 11.5–15.5)
WBC: 7.8 10*3/uL (ref 4.0–10.5)

## 2016-05-25 ENCOUNTER — Telehealth: Payer: Self-pay | Admitting: *Deleted

## 2016-05-25 NOTE — Telephone Encounter (Signed)
Joyce Rice called and left  A message this am that she needs a letter to go back to work faxed to [employer] Rice at [phone]. States it is ok to leave a detailed message.

## 2016-05-26 ENCOUNTER — Ambulatory Visit (HOSPITAL_COMMUNITY)
Admission: RE | Admit: 2016-05-26 | Discharge: 2016-05-26 | Disposition: A | Discharge status: Home / Self Care | Payer: Medicaid Other | Source: Ambulatory Visit | Attending: Obstetrics and Gynecology | Admitting: Obstetrics and Gynecology

## 2016-05-26 ENCOUNTER — Encounter (HOSPITAL_COMMUNITY): Payer: Self-pay | Admitting: Certified Registered Nurse Anesthetist

## 2016-05-26 ENCOUNTER — Ambulatory Visit (HOSPITAL_COMMUNITY): Payer: Medicaid Other | Admitting: Anesthesiology

## 2016-05-26 ENCOUNTER — Encounter (HOSPITAL_COMMUNITY)
Admission: RE | Disposition: A | Discharge status: Home / Self Care | Payer: Self-pay | Source: Ambulatory Visit | Attending: Obstetrics and Gynecology

## 2016-05-26 DIAGNOSIS — Z302 Encounter for sterilization: Secondary | ICD-10-CM | POA: Diagnosis present

## 2016-05-26 HISTORY — PX: LAPAROSCOPIC TUBAL LIGATION: SHX1937

## 2016-05-26 LAB — PREGNANCY, URINE: PREG TEST UR: NEGATIVE

## 2016-05-26 SURGERY — LIGATION, FALLOPIAN TUBE, LAPAROSCOPIC
Anesthesia: General | Site: Abdomen | Laterality: Bilateral

## 2016-05-26 MED ORDER — KETOROLAC TROMETHAMINE 30 MG/ML IJ SOLN
INTRAMUSCULAR | Status: DC | PRN
  Administered 2016-05-26: 30 mg via INTRAVENOUS

## 2016-05-26 MED ORDER — OXYCODONE-ACETAMINOPHEN 5-325 MG PO TABS: 1.0000 | ORAL_TABLET | ORAL | 0 refills | Status: DC | PRN

## 2016-05-26 MED ORDER — MEPERIDINE HCL 25 MG/ML IJ SOLN: 6.2500 mg | INTRAMUSCULAR | Status: DC | PRN

## 2016-05-26 MED ORDER — ONDANSETRON HCL 4 MG/2ML IJ SOLN
INTRAMUSCULAR | Status: AC
  Filled 2016-05-26: qty 4

## 2016-05-26 MED ORDER — HYDROMORPHONE HCL 1 MG/ML IJ SOLN
INTRAMUSCULAR | Status: AC
  Filled 2016-05-26: qty 1

## 2016-05-26 MED ORDER — MIDAZOLAM HCL 2 MG/2ML IJ SOLN
INTRAMUSCULAR | Status: AC
  Filled 2016-05-26: qty 2

## 2016-05-26 MED ORDER — GLYCOPYRROLATE 0.2 MG/ML IJ SOLN
INTRAMUSCULAR | Status: DC | PRN
  Administered 2016-05-26: 0.1 mg via INTRAVENOUS

## 2016-05-26 MED ORDER — FENTANYL CITRATE (PF) 250 MCG/5ML IJ SOLN
INTRAMUSCULAR | Status: AC
Start: 2016-05-26 — End: 2016-05-26
  Filled 2016-05-26: qty 5

## 2016-05-26 MED ORDER — PROPOFOL 10 MG/ML IV BOLUS
INTRAVENOUS | Status: AC
  Filled 2016-05-26: qty 40

## 2016-05-26 MED ORDER — BUPIVACAINE HCL (PF) 0.25 % IJ SOLN
INTRAMUSCULAR | Status: AC
  Filled 2016-05-26: qty 30

## 2016-05-26 MED ORDER — ROCURONIUM BROMIDE 100 MG/10ML IV SOLN
INTRAVENOUS | Status: DC | PRN
  Administered 2016-05-26: 40 mg via INTRAVENOUS

## 2016-05-26 MED ORDER — FENTANYL CITRATE (PF) 100 MCG/2ML IJ SOLN
INTRAMUSCULAR | Status: DC | PRN
  Administered 2016-05-26 (×4): 50 ug via INTRAVENOUS

## 2016-05-26 MED ORDER — KETOROLAC TROMETHAMINE 30 MG/ML IJ SOLN
INTRAMUSCULAR | Status: AC
  Filled 2016-05-26: qty 2

## 2016-05-26 MED ORDER — DEXAMETHASONE SODIUM PHOSPHATE 10 MG/ML IJ SOLN
INTRAMUSCULAR | Status: AC
  Filled 2016-05-26: qty 1

## 2016-05-26 MED ORDER — FENTANYL CITRATE (PF) 250 MCG/5ML IJ SOLN
INTRAMUSCULAR | Status: AC
  Filled 2016-05-26: qty 10

## 2016-05-26 MED ORDER — LIDOCAINE HCL (PF) 1 % IJ SOLN
INTRAMUSCULAR | Status: AC
  Filled 2016-05-26: qty 5

## 2016-05-26 MED ORDER — ONDANSETRON HCL 4 MG/2ML IJ SOLN
INTRAMUSCULAR | Status: DC | PRN
  Administered 2016-05-26: 4 mg via INTRAVENOUS

## 2016-05-26 MED ORDER — DOCUSATE SODIUM 100 MG PO CAPS: 100.0000 mg | ORAL_CAPSULE | Freq: Two times a day (BID) | ORAL | 2 refills | Status: DC | PRN

## 2016-05-26 MED ORDER — BUPIVACAINE HCL (PF) 0.25 % IJ SOLN
INTRAMUSCULAR | Status: DC | PRN
  Administered 2016-05-26: 10 mL

## 2016-05-26 MED ORDER — SUGAMMADEX SODIUM 200 MG/2ML IV SOLN
INTRAVENOUS | Status: DC | PRN
  Administered 2016-05-26: 200 mg via INTRAVENOUS

## 2016-05-26 MED ORDER — MIDAZOLAM HCL 2 MG/2ML IJ SOLN
INTRAMUSCULAR | Status: AC
  Filled 2016-05-26: qty 4

## 2016-05-26 MED ORDER — SCOPOLAMINE 1 MG/3DAYS TD PT72
MEDICATED_PATCH | TRANSDERMAL | Status: DC
Start: 2016-05-26 — End: 2016-05-26
  Administered 2016-05-26: 1.5 mg via TRANSDERMAL
  Filled 2016-05-26: qty 1

## 2016-05-26 MED ORDER — DEXAMETHASONE SODIUM PHOSPHATE 10 MG/ML IJ SOLN
INTRAMUSCULAR | Status: DC | PRN
  Administered 2016-05-26: 10 mg via INTRAVENOUS

## 2016-05-26 MED ORDER — PROPOFOL 10 MG/ML IV BOLUS
INTRAVENOUS | Status: DC | PRN
  Administered 2016-05-26: 150 mg via INTRAVENOUS

## 2016-05-26 MED ORDER — DEXAMETHASONE SODIUM PHOSPHATE 4 MG/ML IJ SOLN
INTRAMUSCULAR | Status: AC
  Filled 2016-05-26: qty 2

## 2016-05-26 MED ORDER — GLYCOPYRROLATE 0.2 MG/ML IJ SOLN
INTRAMUSCULAR | Status: AC
  Filled 2016-05-26: qty 1

## 2016-05-26 MED ORDER — HYDROMORPHONE HCL 1 MG/ML IJ SOLN
0.2500 mg | INTRAMUSCULAR | Status: DC | PRN
  Administered 2016-05-26: 0.5 mg via INTRAVENOUS

## 2016-05-26 MED ORDER — SCOPOLAMINE 1 MG/3DAYS TD PT72
1.0000 | MEDICATED_PATCH | Freq: Once | TRANSDERMAL | Status: DC
  Administered 2016-05-26: 1.5 mg via TRANSDERMAL

## 2016-05-26 MED ORDER — LIDOCAINE HCL (CARDIAC) 20 MG/ML IV SOLN
INTRAVENOUS | Status: DC | PRN
  Administered 2016-05-26: 100 mg via INTRAVENOUS

## 2016-05-26 MED ORDER — ONDANSETRON HCL 4 MG/2ML IJ SOLN: 4.0000 mg | Freq: Once | INTRAMUSCULAR | Status: DC | PRN

## 2016-05-26 MED ORDER — MIDAZOLAM HCL 2 MG/2ML IJ SOLN
INTRAMUSCULAR | Status: DC | PRN
  Administered 2016-05-26: 2 mg via INTRAVENOUS

## 2016-05-26 MED ORDER — LACTATED RINGERS IV SOLN
INTRAVENOUS | Status: DC
  Administered 2016-05-26: 09:00:00 via INTRAVENOUS

## 2016-05-26 MED ORDER — SUGAMMADEX SODIUM 200 MG/2ML IV SOLN
INTRAVENOUS | Status: AC
  Filled 2016-05-26: qty 2

## 2016-05-26 MED ORDER — NEOSTIGMINE METHYLSULFATE 10 MG/10ML IV SOLN
INTRAVENOUS | Status: AC
  Filled 2016-05-26: qty 2

## 2016-05-26 MED ORDER — GLYCOPYRROLATE 0.2 MG/ML IJ SOLN
INTRAMUSCULAR | Status: AC
  Filled 2016-05-26: qty 6

## 2016-05-26 MED ORDER — IBUPROFEN 600 MG PO TABS: 600.0000 mg | ORAL_TABLET | Freq: Four times a day (QID) | ORAL | 1 refills | Status: DC | PRN

## 2016-05-26 SURGICAL SUPPLY — 23 items
DRSG OPSITE POSTOP 3X4 (GAUZE/BANDAGES/DRESSINGS) ×2 IMPLANT
DURAPREP 26ML APPLICATOR (WOUND CARE) ×3 IMPLANT
ELECT REM PT RETURN 9FT ADLT (ELECTROSURGICAL) ×3
ELECTRODE REM PT RTRN 9FT ADLT (ELECTROSURGICAL) IMPLANT
GLOVE BIOGEL PI IND STRL 6.5 (GLOVE) ×1 IMPLANT
GLOVE BIOGEL PI IND STRL 7.0 (GLOVE) ×1 IMPLANT
GLOVE BIOGEL PI INDICATOR 6.5 (GLOVE) ×2
GLOVE BIOGEL PI INDICATOR 7.0 (GLOVE) ×2
GLOVE SURG SS PI 6.0 STRL IVOR (GLOVE) ×3 IMPLANT
GOWN STRL REUS W/TWL LRG LVL3 (GOWN DISPOSABLE) ×6 IMPLANT
NS IRRIG 1000ML POUR BTL (IV SOLUTION) ×3 IMPLANT
PACK LAPAROSCOPY BASIN (CUSTOM PROCEDURE TRAY) ×3 IMPLANT
PACK TRENDGUARD 450 HYBRID PRO (MISCELLANEOUS) IMPLANT
PACK TRENDGUARD 600 HYBRD PROC (MISCELLANEOUS) IMPLANT
PROTECTOR NERVE ULNAR (MISCELLANEOUS) ×6 IMPLANT
SUT MON AB 4-0 PS1 27 (SUTURE) ×3 IMPLANT
SUT VICRYL 0 UR6 27IN ABS (SUTURE) ×3 IMPLANT
TOWEL OR 17X24 6PK STRL BLUE (TOWEL DISPOSABLE) ×6 IMPLANT
TRAY FOLEY CATH SILVER 16FR (SET/KITS/TRAYS/PACK) ×3 IMPLANT
TRENDGUARD 450 HYBRID PRO PACK (MISCELLANEOUS)
TRENDGUARD 600 HYBRID PROC PK (MISCELLANEOUS)
TROCAR BALLN 12MMX100 BLUNT (TROCAR) ×3 IMPLANT
WATER STERILE IRR 1000ML POUR (IV SOLUTION) ×3 IMPLANT

## 2016-05-26 NOTE — Anesthesia Preprocedure Evaluation (Signed)
Anesthesia Evaluation  Patient identified by MRN, date of birth, ID band Patient awake    Reviewed: Allergy & Precautions, NPO status , Patient's Chart, lab work & pertinent test results  History of Anesthesia Complications (+) PONV  Airway Mallampati: I  TM Distance: >3 FB Neck ROM: Full    Dental   Pulmonary    Pulmonary exam normal        Cardiovascular Normal cardiovascular exam     Neuro/Psych    GI/Hepatic   Endo/Other    Renal/GU      Musculoskeletal   Abdominal   Peds  Hematology   Anesthesia Other Findings   Reproductive/Obstetrics                             Anesthesia Physical Anesthesia Plan  ASA: II  Anesthesia Plan: General   Post-op Pain Management:    Induction: Intravenous  Airway Management Planned: Oral ETT  Additional Equipment:   Intra-op Plan:   Post-operative Plan: Extubation in OR  Informed Consent: I have reviewed the patients History and Physical, chart, labs and discussed the procedure including the risks, benefits and alternatives for the proposed anesthesia with the patient or authorized representative who has indicated his/her understanding and acceptance.     Plan Discussed with: CRNA and Surgeon  Anesthesia Plan Comments:         Anesthesia Quick Evaluation  

## 2016-05-26 NOTE — Op Note (Signed)
Joyce Rice 05/26/2016  PREOPERATIVE DIAGNOSIS:  Undesired fertility  POSTOPERATIVE DIAGNOSIS:  Undesired fertility  PROCEDURE:  Laparoscopic Bilateral Tubal Sterilization  SURGEON: Dr. Catalina AntiguaPeggy Talena Neira  ANESTHESIA:  General endotracheal  COMPLICATIONS:  None immediate.  ESTIMATED BLOOD LOSS:  Less than 20 ml.  FLUIDS: 1000 ml LR.  URINE OUTPUT:  20 ml of clear urine.  INDICATIONS: 30 y.o. [id number]  with undesired fertility, desires permanent sterilization. Other reversible forms of contraception were discussed with patient; she declines all other modalities.  Risks of procedure discussed with patient including permanence of method, bleeding, infection, injury to surrounding organs and need for additional procedures including laparotomy, risk of regret.  Failure risk of 0.5-1% with increased risk of ectopic gestation if pregnancy occurs was also discussed with patient.      FINDINGS:  Normal uterus, tubes, and ovaries.  TECHNIQUE:  The patient was taken to the operating room where general anesthesia was obtained without difficulty.  She was then placed in the dorsal lithotomy position and prepared and draped in sterile fashion.  After an adequate timeout was performed, a bivalved speculum was then placed in the patient's vagina, and the anterior lip of cervix grasped with the single-tooth tenaculum.  The uterine manipulator was then advanced into the uterus.  The speculum was removed from the vagina.  Attention was then turned to the patient's abdomen where a 10-mm skin incision was made on the umbilical fold.  The underlying fascia was identified, grasped with Kocher clamps, tented up and entered sharply with mayo scissors. The fascia was tagged with 0-Vicryl. The peritoneum was entered bluntly.The 11 mm trocar and sleeve were introduced into the abdominal cavityl.  Intraperitoneal placement was confirmed with the use of the laparoscope. Pneumoperitoneum was achieved by the insufflation  of CO2 gas. A survey of the patient's pelvis and abdomen revealed entirely normal anatomy.  The fallopian tubes were observed and found to be normal in appearance. Bipolar forceps was then advanced through the operative port and used to coagulate a 3-cm portion of the left tube in the mid isthmic area.  Good blanching and coagulation was noted at the site of the application.  There was no bleeding noted in the mesosalpinx.  A similar process was carried out on the right fallopian tube.  Good hemostasis was noted overall. The instruments were then removed from the patient's abdomen and the fascial incision was repaired with 0 Vicryl, and the skin was closed with 4-0 Vicryl. The uterine manipulator and the tenaculum were removed from the vagina without complications. The patient tolerated the procedure well.  Sponge, lap, and needle counts were correct times two.  The patient was then taken to the recovery room awake, extubated and in stable  in stable condition.

## 2016-05-26 NOTE — H&P (Signed)
Joyce Rice is an 30 y.o. female. [id number] here for her desire of permanent sterilization. Patient has been using depo-provera for contraception. She denies any complaints  Pertinent Gynecological History: Menses: regular every month without intermenstrual spotting Bleeding: 5 days Contraception: Depo-Provera injections DES exposure: denies Blood transfusions: none Previous GYN Procedures: DNC  Last mammogram: n/a Last pap: normal Date: 02/2014 OB History: G4, P2022   Menstrual History:  Patient's last menstrual period was 05/18/2016 (exact date).    Past Medical History:  Diagnosis Date  . Bartholin cyst   . Chlamydia   . Chlamydia 12/29/2011   Azithromycin given 12/29/11, vomited. Partner Tx per pt. Rx Doxy 01/29/12.   Marland Kitchen. Headache   . Infection    UTI  . Irregular periods/menstrual cycles   . PONV (postoperative nausea and vomiting)     Past Surgical History:  Procedure Laterality Date  . DILATION AND CURETTAGE OF UTERUS    . INDUCED ABORTION      Family History  Problem Relation Age of Onset  . Hearing loss Neg Hx     Social History:  reports that she has never smoked. She has never used smokeless tobacco. She reports that she drinks alcohol. She reports that she does not use drugs.  Allergies: No Known Allergies  Prescriptions Prior to Admission  Medication Sig Dispense Refill Last Dose  . Prenat w/o A Vit-FeFum-FePo-FA (CONCEPT OB) 130-92.4-1 MG CAPS Take 1 capsule by mouth daily.   Past Week at unknown    ROS See pertinent in HPI  Blood pressure 124/83, pulse 66, temperature 98.2 F (36.8 C), temperature source Oral, resp. rate 16, last menstrual period 05/18/2016, SpO2 100 %, currently breastfeeding. Physical Exam GENERAL: Well-developed, well-nourished female in no acute distress.  HEENT: Normocephalic, atraumatic. Sclerae anicteric.  NECK: Supple. Normal thyroid.  LUNGS: Clear to auscultation bilaterally.  HEART: Regular rate and rhythm. ABDOMEN:  Soft, nontender, nondistended. No organomegaly. PELVIC: Deferred to OR EXTREMITIES: No cyanosis, clubbing, or edema, 2+ distal pulses.  Results for orders placed or performed during the hospital encounter of 05/26/16 (from the past 24 hour(s))  Pregnancy, urine     Status: None   Collection Time: 05/26/16  8:45 AM  Result Value Ref Range   Preg Test, Ur NEGATIVE NEGATIVE    No results found.  Assessment/Plan:  30 y.o. [id number]  with undesired fertility, desires permanent sterilization. Other reversible forms of contraception were discussed with patient; she declines all other modalities.  Risks of procedure discussed with patient including permanence of method, bleeding, infection, injury to surrounding organs and need for additional procedures including laparotomy, risk of regret.  Failure risk of 0.5-1% with increased risk of ectopic gestation if pregnancy occurs was also discussed with patient.   Patient verbalized understanding and all questions were answered        Joyce Rice 05/26/2016, 9:26 AM

## 2016-05-26 NOTE — Anesthesia Procedure Notes (Signed)
Procedure Name: Intubation Date/Time: 05/26/2016 10:35 AM Performed by: Earmon PhoenixWILKERSON, Flynn Lininger P Pre-anesthesia Checklist: Patient identified, Timeout performed, Emergency Drugs available, Patient being monitored and Suction available Patient Re-evaluated:Patient Re-evaluated prior to inductionOxygen Delivery Method: Circle system utilized Preoxygenation: Pre-oxygenation with 100% oxygen Intubation Type: IV induction Ventilation: Mask ventilation without difficulty Laryngoscope Size: Mac and 3 Grade View: Grade I Number of attempts: 1 Airway Equipment and Method: Stylet Placement Confirmation: ETT inserted through vocal cords under direct vision,  breath sounds checked- equal and bilateral,  positive ETCO2 and CO2 detector Secured at: 20 cm Tube secured with: Tape Dental Injury: Teeth and Oropharynx as per pre-operative assessment

## 2016-05-26 NOTE — Anesthesia Postprocedure Evaluation (Signed)
Anesthesia Post Note  Patient: Glass blower/designerLaquanda Rice  Procedure(s) Performed: Procedure(s) (LRB): LAPAROSCOPIC TUBAL LIGATION (Bilateral)  Patient location during evaluation: PACU Anesthesia Type: General Level of consciousness: awake and alert Pain management: pain level controlled Vital Signs Assessment: post-procedure vital signs reviewed and stable Respiratory status: spontaneous breathing, nonlabored ventilation, respiratory function stable and patient connected to nasal cannula oxygen Cardiovascular status: blood pressure returned to baseline and stable Postop Assessment: no signs of nausea or vomiting Anesthetic complications: no     Last Vitals:  Vitals:   05/26/16 1130 05/26/16 1145  BP: 109/72 109/69  Pulse: (!) 56 (!) 50  Resp: 13 12  Temp:      Last Pain:  Vitals:   05/26/16 1145  TempSrc:   PainSc: 5    Pain Goal: Patients Stated Pain Goal: 3 (05/26/16 1145)               [name] DAVID

## 2016-05-26 NOTE — Transfer of Care (Signed)
Immediate Anesthesia Transfer of Care Note  Patient: Joyce SanesLaquanda Krontz  Procedure(s) Performed: Procedure(s): LAPAROSCOPIC TUBAL LIGATION (Bilateral)  Patient Location: PACU  Anesthesia Type:General  Level of Consciousness: awake, alert , oriented and patient cooperative  Airway & Oxygen Therapy: Patient Spontanous Breathing and Patient connected to nasal cannula oxygen  Post-op Assessment: Report given to RN and Post -op Vital signs reviewed and stable  Post vital signs: Reviewed and stable  Last Vitals:  Vitals:   05/26/16 0850  BP: 124/83  Pulse: 66  Resp: 16  Temp: 36.8 C    Last Pain:  Vitals:   05/26/16 0850  TempSrc: Oral      Patients Stated Pain Goal: 3 (05/26/16 0850)  Complications: No apparent anesthesia complications

## 2016-05-26 NOTE — Discharge Instructions (Signed)
°Post Anesthesia Home Care Instructions  ° °Activity: °Get plenty of rest for the remainder of the day. A responsible adult should stay with you for 24 hours following the procedure.  °For the next 24 hours, DO NOT: °-Drive a car °-Operate machinery °-Drink alcoholic beverages °-Take any medication unless instructed by your physician °-Make any legal decisions or sign important papers. ° °Meals: °Start with liquid foods such as gelatin or soup. Progress to regular foods as tolerated. Avoid greasy, spicy, heavy foods. If nausea and/or vomiting occur, drink only clear liquids until the nausea and/or vomiting subsides. Call your physician if vomiting continues. ° °Special Instructions/Symptoms: °Your throat may feel dry or sore from the anesthesia or the breathing tube placed in your throat during surgery. If this causes discomfort, gargle with warm salt water. The discomfort should disappear within 24 hours. ° °If you had a scopolamine patch placed behind your ear for the management of post- operative nausea and/or vomiting: ° °1. The medication in the patch is effective for 72 hours, after which it should be removed.  Wrap patch in a tissue and discard in the trash. Wash hands thoroughly with soap and water. °2. You may remove the patch earlier than 72 hours if you experience unpleasant side effects which may include dry mouth, dizziness or visual disturbances. °3. Avoid touching the patch. Wash your hands with soap and water after contact with the patch. °  °Diagnostic Laparoscopy °A diagnostic laparoscopy is a procedure to diagnose diseases in the abdomen. During the procedure, a thin, lighted, pencil-sized instrument called a laparoscope is inserted into the abdomen through an incision. The laparoscope allows your health care provider to look at the organs inside your body. °LET YOUR HEALTH CARE PROVIDER KNOW ABOUT: °· Any allergies you have. °· All medicines you are taking, including vitamins, herbs, eye drops,  creams, and over-the-counter medicines. °· Previous problems you or members of your family have had with the use of anesthetics. °· Any blood disorders you have. °· Previous surgeries you have had. °· Medical conditions you have. °RISKS AND COMPLICATIONS  °Generally, this is a safe procedure. However, problems can occur, which may include: °· Infection. °· Bleeding. °· Damage to other organs. °· Allergic reaction to the anesthetics used during the procedure. °BEFORE THE PROCEDURE °· Do not eat or drink anything after midnight on the night before the procedure or as directed by your health care provider. °· Ask your health care provider about: °¨ Changing or stopping your regular medicines. °¨ Taking medicines such as aspirin and ibuprofen. These medicines can thin your blood. Do not take these medicines before your procedure if your health care provider instructs you not to. °· Plan to have someone take you home after the procedure. °PROCEDURE °· You may be given a medicine to help you relax (sedative). °· You will be given a medicine to make you sleep (general anesthetic). °· Your abdomen will be inflated with a gas. This will make your organs easier to see. °· Small incisions will be made in your abdomen. °· A laparoscope and other small instruments will be inserted into the abdomen through the incisions. °· A tissue sample may be removed from an organ in the abdomen for examination. °· The instruments will be removed from the abdomen. °· The gas will be released. °· The incisions will be closed with stitches (sutures). °AFTER THE PROCEDURE  °Your blood pressure, heart rate, breathing rate, and blood oxygen level will be monitored often until the   medicines you were given have worn off. °  °This information is not intended to replace advice given to you by your health care provider. Make sure you discuss any questions you have with your health care provider. °  °Document Released: 11/09/2000 Document Revised:  04/24/2015 Document Reviewed: 03/16/2014 °Elsevier Interactive Patient Education ©2016 [employer] ° °

## 2016-05-28 ENCOUNTER — Encounter (HOSPITAL_COMMUNITY): Payer: Self-pay | Admitting: Obstetrics and Gynecology

## 2016-05-28 NOTE — Telephone Encounter (Signed)
Called patient and left message stating we are trying to reach you to return your phone. I am a little confused as to what letter you need as FMLA paperwork was previously completed and submitted. Asked that she callback with more information/dates.

## 2016-05-29 NOTE — Telephone Encounter (Signed)
Patient left another message, wants instruction on caring for her honeycomb dressing, how to clean, when to remove, how should it look. Please return call.

## 2016-05-29 NOTE — Telephone Encounter (Signed)
Attempted to return call. There was no answer. Message left stating I am returning her calls.

## 2016-05-29 NOTE — Telephone Encounter (Signed)
Patient called looking for test results. May leave a detailed message.

## 2016-05-29 NOTE — Telephone Encounter (Signed)
Second call, looking for test results, may leave detailed message.

## 2016-06-01 NOTE — Telephone Encounter (Signed)
A letter has been has been faxed to patient employer.

## 2016-06-03 NOTE — Telephone Encounter (Signed)
I called [name] and left a detailed message that I was calling back to see if all your questions had been answered. If you still have questions from your previous call, please call us and leave a detailed message.

## 2016-06-05 ENCOUNTER — Encounter: Payer: Self-pay | Admitting: *Deleted

## 2016-06-10 ENCOUNTER — Encounter: Payer: Self-pay | Admitting: Obstetrics and Gynecology

## 2016-06-10 ENCOUNTER — Ambulatory Visit (INDEPENDENT_AMBULATORY_CARE_PROVIDER_SITE_OTHER): Payer: Self-pay | Admitting: Obstetrics and Gynecology

## 2016-06-10 VITALS — BP 109/67 | HR 60 | Wt 155.0 lb

## 2016-06-10 DIAGNOSIS — Z9889 Other specified postprocedural states: Secondary | ICD-10-CM

## 2016-06-10 NOTE — Progress Notes (Signed)
   Subjective:    Patient ID: Joyce SanesLaquanda Nakayama, female    DOB: 06/27/1986, 30 y.o.   MRN: [id number]  HPI  30 yo [id number] presenting today for post op check s/p lsc bilateral tubal ligation on 05/26/16. Patient reports feeling well. Her pain is well controlled with ibuprofen. She continues to experience some shoulder pain bilaterally and a pulling sensation in her umbilical incision with certain movements. She denies fever, chills, or abnormal drainage from her incision  Past Medical History:  Diagnosis Date  . Bartholin cyst   . Chlamydia   . Chlamydia 12/29/2011   Azithromycin given 12/29/11, vomited. Partner Tx per pt. Rx Doxy 01/29/12.   Marland Kitchen. Headache   . Infection    UTI  . Irregular periods/menstrual cycles   . PONV (postoperative nausea and vomiting)    Past Surgical History:  Procedure Laterality Date  . DILATION AND CURETTAGE OF UTERUS    . INDUCED ABORTION    . LAPAROSCOPIC TUBAL LIGATION Bilateral 05/26/2016   Procedure: LAPAROSCOPIC TUBAL LIGATION;  Surgeon: Catalina AntiguaPeggy Flynt Breeze, MD;  Location: [facility];  Service: Gynecology;  Laterality: Bilateral;   Family History  Problem Relation Age of Onset  . Hearing loss Neg Hx    Social History  Substance Use Topics  . Smoking status: Never Smoker  . Smokeless tobacco: Never Used  . Alcohol use Yes     Comment: once or twice a year    Review of Systems See pertinent in HPI    Objective:   Physical Exam Blood pressure 109/67, pulse 60, weight 155 lb (70.3 kg), last menstrual period 05/18/2016, currently breastfeeding. GENERAL: Well-developed, well-nourished female in no acute distress.  ABDOMEN: Soft, nontender, nondistended.  Incision: no erythema, induration or drainage. Umbilical incision healed well EXTREMITIES: No cyanosis, clubbing, or edema, 2+ distal pulses.     Assessment & Plan:  30 yo [id number] here for post op check s/l LSC BTL on 10/10 - Patient is medically cleared to resume all activities of daily living -  Reassurance provided regarding shoulder pain likely related to residual gas from insufflating the abdomen - Ibuprofen prn - RTC prn

## 2016-06-10 NOTE — Progress Notes (Signed)
Pt reports feeling tugging in her belly button area and pain in her lower back and bilateral upper shoulders.

## 2016-06-22 ENCOUNTER — Telehealth: Payer: Self-pay | Admitting: *Deleted

## 2016-06-22 NOTE — Telephone Encounter (Signed)
Joyce Rice left a voicemail this am stating she has a question. States she had a btl 05/26/16 and was supposed to have her cycle 06/17/16 and hasn't. Wants to know if this is normal. May leave detailed voice message as she may not answer because she may be at work.

## 2016-06-24 ENCOUNTER — Telehealth: Payer: Self-pay

## 2016-06-24 NOTE — Telephone Encounter (Signed)
I called Dotty and left a message it is not unusual that your cycle is late- the surgery probably affected your cycle ; as many things do affect a woman's cycle. May take a home pregnancy test on first urine of the day if you are worried you are pregnant. If you do not get a period in a few weeks, can call and make an appointment or call if you have other questions.

## 2016-06-24 NOTE — Telephone Encounter (Signed)
Pt called and wanted to know if it is normal not have a cycle after she had a tubal.

## 2016-07-08 ENCOUNTER — Encounter (HOSPITAL_COMMUNITY): Payer: Self-pay

## 2016-08-19 ENCOUNTER — Telehealth: Payer: Self-pay | Admitting: *Deleted

## 2016-08-19 NOTE — Telephone Encounter (Signed)
Pt left message yesterday stating that she had BTS 2 months ago. She has been on her cycle for 3 weeks and wants to know if this is normal. The bleeding is not heavy - just like a normal period. Please call back and may leave a detailed message if she does not answer.

## 2016-08-21 NOTE — Telephone Encounter (Signed)
I have left a message for patient to call us back regarding her bleeding.

## 2016-08-25 NOTE — Telephone Encounter (Signed)
Called pt and left detailed message @ her request stating that it is not unusual to have abnormal bleeding after surgery. Her normal menstrual cycle should return within 1-2 months. If her bleeding continues to be persistent or becomes heavier she should call our office to schedule a visit with any provider. She may also call back if sh has addition al questions for the nurse.

## 2016-09-24 ENCOUNTER — Telehealth: Payer: Self-pay | Admitting: General Practice

## 2016-09-24 NOTE — Telephone Encounter (Signed)
Patient called and left message stating she is concerned about her cycle. Patient states she had a period for 3 weeks in December & no period in January. Patient would like to know if this is normal. Called patient, no answer- left message that we are trying to return your phone call, please call us back

## 2016-09-29 NOTE — Telephone Encounter (Signed)
Left a detailed message for patient to start keeping track of her cycle. Per provider Erin patient should also do a home pregnancy test. If her period continued to be abnormal she will need to scheduled appointment with our office.

## 2016-10-29 IMAGING — US US MFM OB COMP +14 WKS
1 series · 14 of 28 positions shown · non-contrast
Comparison: none

[Series 1: us mfm ob comp +14 wks · 62 acquisitions, 14 frames shown]
[im 3/62]
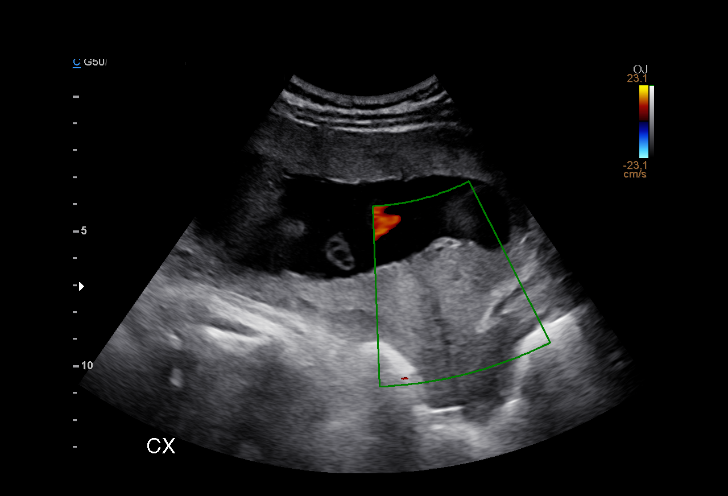
[im 7/62]
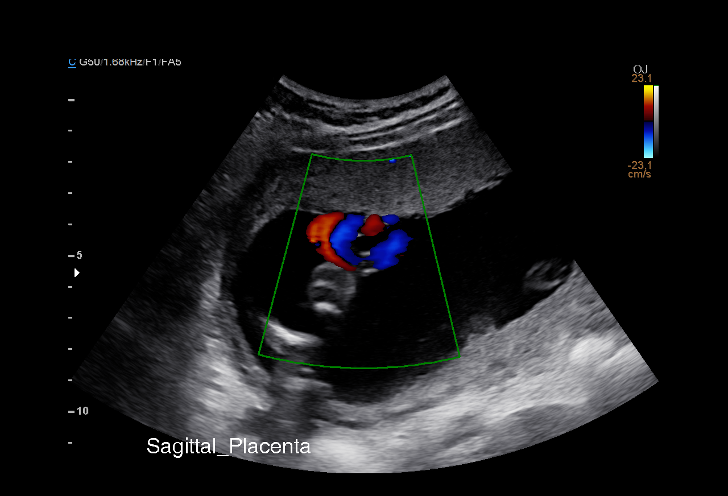
[im 12/62]
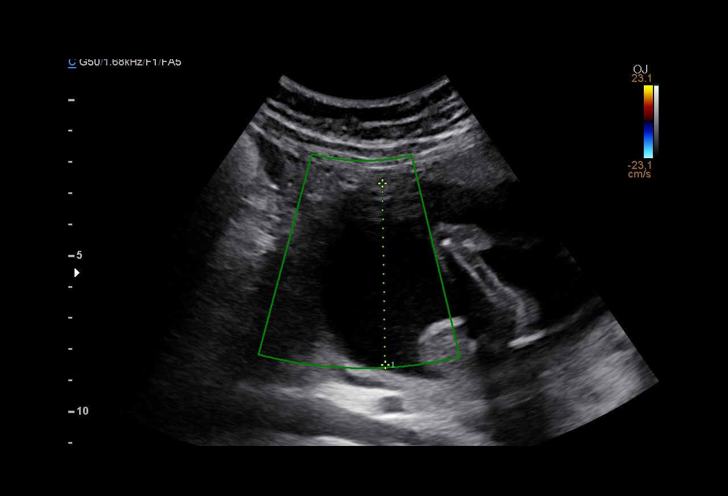
[im 16/62]
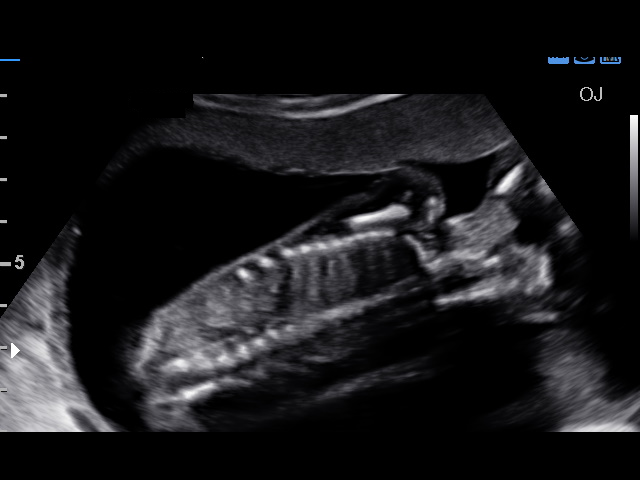
[im 21/62]
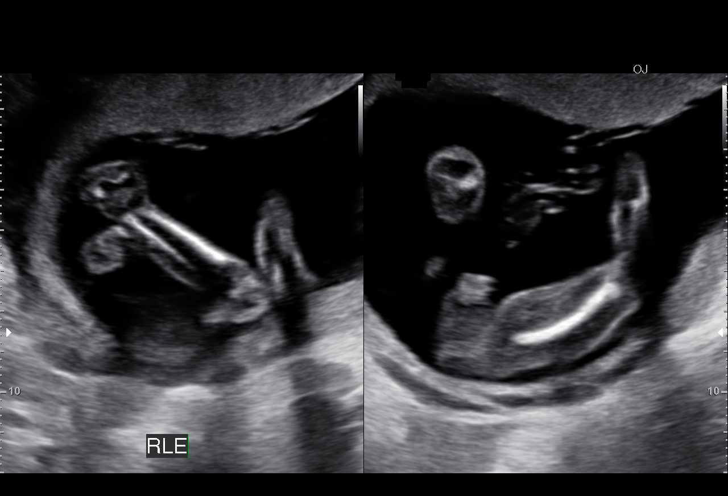
[im 25/62]
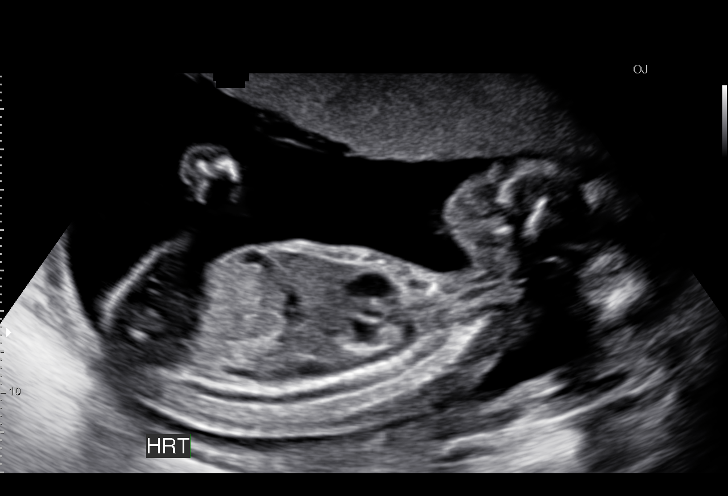
[im 30/62]
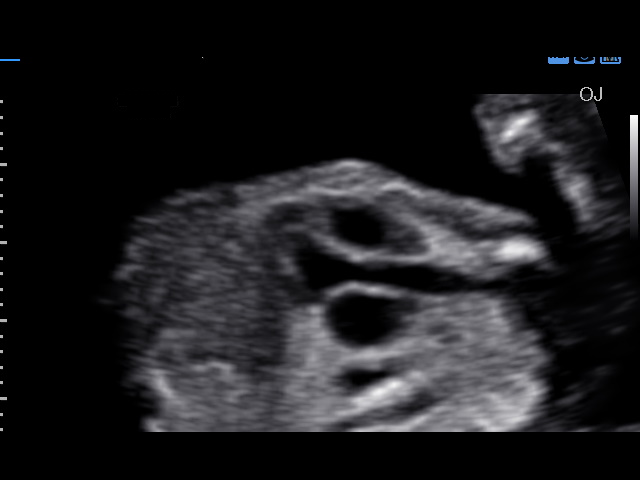
[im 34/62]
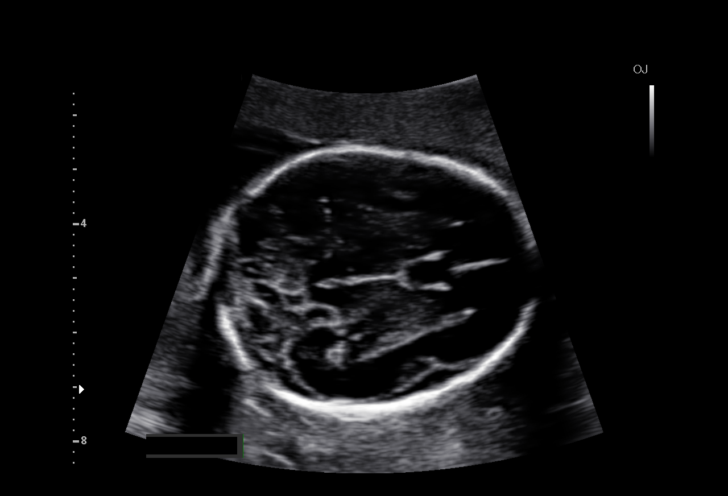
[im 39/62]
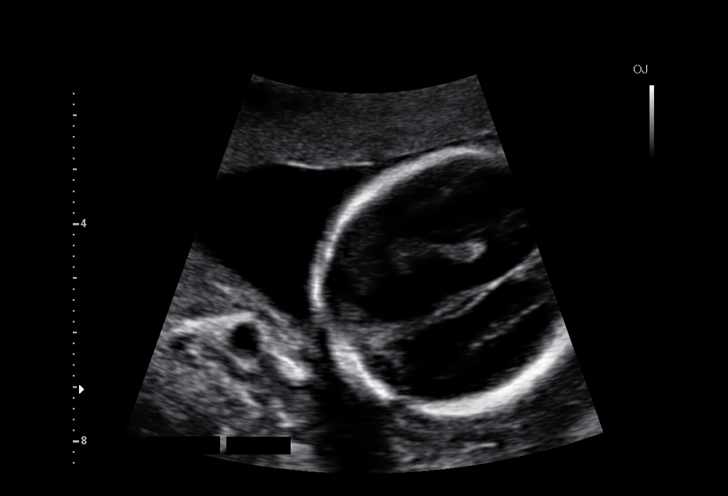
[im 43/62]
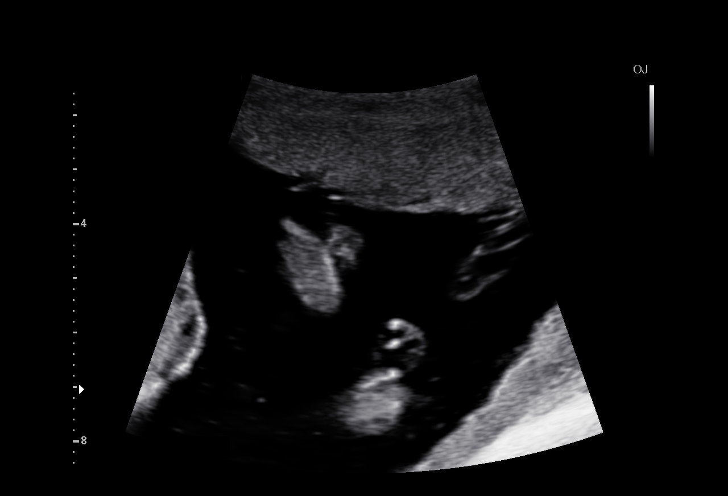
[im 48/62]
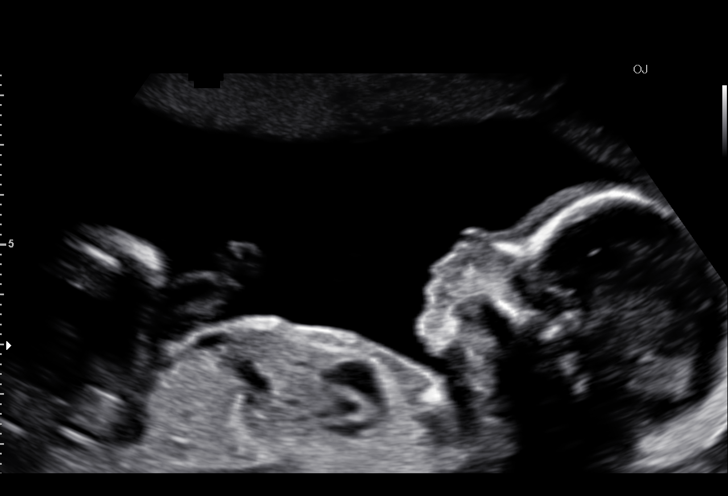
[im 52/62]
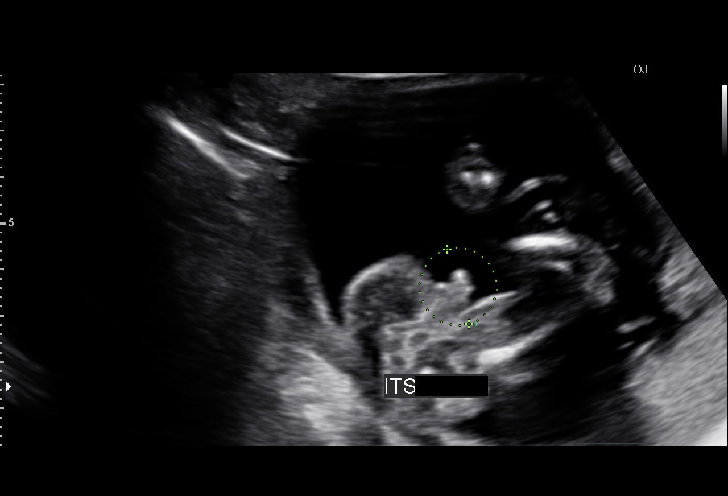
[im 57/62]
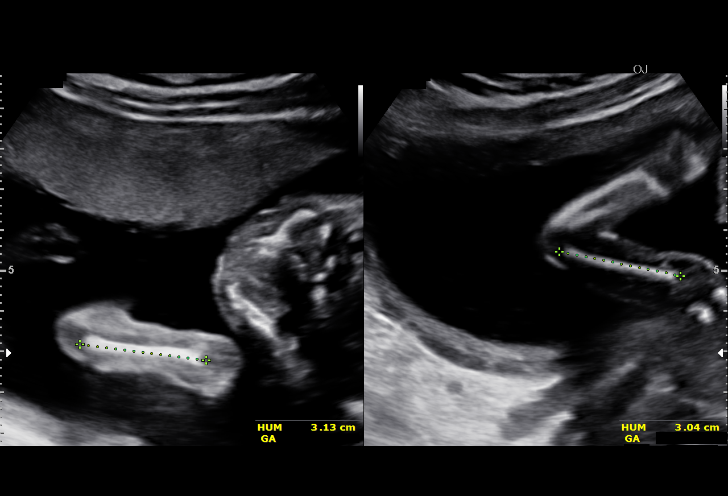
[im 62/62]
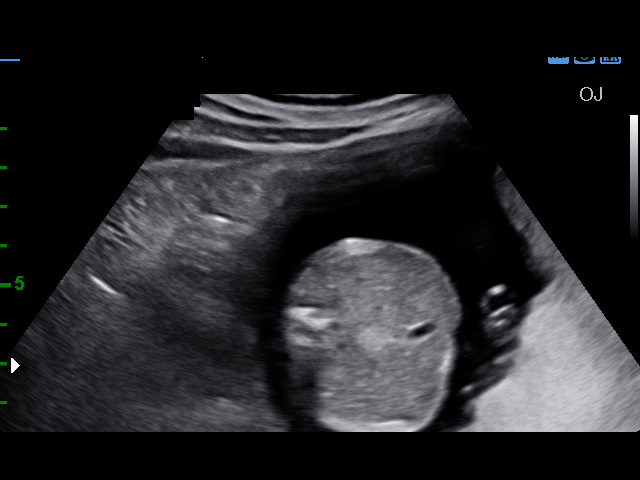

[14 of 28 positions shown; findings below may reference images not displayed]

OB/[facility]yn Clinic
[REDACTED]-
Faculty Physician

1  DUVAN ALBERTO NINGUNO              [id number]      [id number]     [id number]
Indications

19 weeks gestation of pregnancy
Basic anatomic survey                          Z36
OB History

Height:       5'1"    Weight:   138       BMI:
Gravidity:    4         Term:   1        Prem:   0        SAB:   2
TOP:          0       Ectopic:  0        Living: 1
Fetal Evaluation

Num Of Fetuses:     1
Fetal Heart         161
Rate(bpm):
Cardiac Activity:   Observed
Presentation:       Cephalic
Placenta:           Anterior, above cervical os
P. Cord Insertion:  Visualized

Amniotic Fluid
AFI FV:      Subjectively within normal limits
Larg Pckt:     5.8  cm
Biometry

BPD:      48.2  mm     G. Age:  20w 4d                  CI:        75.96   %   70 - 86
FL/HC:      17.1   %   16.8 -
HC:      175.3  mm     G. Age:  20w 0d         51  %    HC/AC:      1.14       1.09 -
AC:      153.9  mm     G. Age:  20w 4d         68  %    FL/BPD:     62.2   %
FL:         30  mm     G. Age:  19w 2d         23  %    FL/AC:      19.5   %   20 - 24
HUM:      30.8  mm     G. Age:  20w 2d         62  %
CER:      22.5  mm     G. Age:  21w 2d         76  %
NFT:       4.2  mm
CM:        3.9  mm

Est. FW:     326  gm    0 lb 11 oz      51  %
Gestational Age

U/S Today:     20w 1d                                        EDD:   03/09/16
Best:          19w 6d    Det. By:   Early Ultrasound         EDD:   03/11/16
(07/24/15)
Anatomy

Cranium:          Appears normal         Aortic Arch:      Appears normal
Fetal Cavum:      Appears normal         Ductal Arch:      Appears normal
Ventricles:       Appears normal         Diaphragm:        Appears normal
Choroid Plexus:   Appears normal         Stomach:          Appears normal, left
sided
Cerebellum:       Appears normal         Abdomen:          Appears normal
Posterior Fossa:  Appears normal         Abdominal Wall:   Appears nml (cord
insert, abd wall)
Nuchal Fold:      Appears normal         Cord Vessels:     Appears normal (3
vessel cord)
Face:             Appears normal         Kidneys:          Appear normal
(orbits and profile)
Lips:             Appears normal         Bladder:          Appears normal
Fetal Thoracic:   Appears normal         Spine:            Limited views
appear normal
Heart:            Appears normal         Upper             Appears normal
(4CH, axis, and        Extremities:
situs)
RVOT:             Appears normal         Lower             Appears normal
Extremities:
LVOT:             Appears normal

Other:  Male gender. Heels and 5th digit visualized.
Cervix Uterus Adnexa

Cervix
Length:            3.3  cm.
Normal appearance by transabdominal scan.

Left Ovary
Not visualized. No adnexal mass visualized.

Right Ovary
Not visualized. No adnexal mass visualized.
Impression

SIUP at 19+6 weeks
Normal detailed fetal anatomy
Markers of aneuploidy: none
Normal amniotic fluid volume
Measurements consistent with early US
Recommendations

Follow-up as clinically indicated

## 2016-11-03 ENCOUNTER — Ambulatory Visit (INDEPENDENT_AMBULATORY_CARE_PROVIDER_SITE_OTHER): Payer: Medicaid Other | Admitting: Obstetrics and Gynecology

## 2016-11-03 ENCOUNTER — Encounter: Payer: Self-pay | Admitting: Obstetrics and Gynecology

## 2016-11-03 VITALS — BP 109/66 | HR 63 | Ht 63.0 in | Wt 167.2 lb

## 2016-11-03 DIAGNOSIS — Z309 Encounter for contraceptive management, unspecified: Secondary | ICD-10-CM

## 2016-11-03 DIAGNOSIS — N938 Other specified abnormal uterine and vaginal bleeding: Secondary | ICD-10-CM

## 2016-11-03 MED ORDER — NORGESTIMATE-ETH ESTRADIOL 0.25-35 MG-MCG PO TABS: 1.0000 | ORAL_TABLET | Freq: Every day | ORAL | 11 refills | Status: DC

## 2016-11-03 NOTE — Progress Notes (Signed)
31 yo [id number] presenting today for the evaluation of abnormal vaginal bleeding. Patient reports irregular bleeding present since BTL in October 2017. Patient reports no period in November, a 3-week period in December, no period in January and 5 days of spotting in February. No period thus far in march. Patient denies any pelvic pain or abnormal discharge. She denies any stressful events in her life.  Past Medical History:  Diagnosis Date  . Bartholin cyst   . Chlamydia   . Chlamydia 12/29/2011   Azithromycin given 12/29/11, vomited. Partner Tx per pt. Rx Doxy 01/29/12.   Marland Kitchen. Headache   . Infection    UTI  . Irregular periods/menstrual cycles   . PONV (postoperative nausea and vomiting)    Past Surgical History:  Procedure Laterality Date  . DILATION AND CURETTAGE OF UTERUS    . INDUCED ABORTION    . LAPAROSCOPIC TUBAL LIGATION Bilateral 05/26/2016   Procedure: LAPAROSCOPIC TUBAL LIGATION;  Surgeon: Catalina AntiguaPeggy Jessi Jessop, MD;  Location: [facility];  Service: Gynecology;  Laterality: Bilateral;   Family History  Problem Relation Age of Onset  . Hearing loss Neg Hx    Social History  Substance Use Topics  . Smoking status: Never Smoker  . Smokeless tobacco: Never Used  . Alcohol use Yes     Comment: once or twice a year   ROS See pertinent in HPI Blood pressure 109/66, pulse 63, height 5\' 3"  (1.6 m), weight 167 lb 3.2 oz (75.8 kg), last menstrual period 10/06/2016, not currently breastfeeding.  GENERAL: Well-developed, well-nourished female in no acute distress.  ABDOMEN: Soft, nontender, nondistended. No organomegaly. PELVIC: Normal external female genitalia. Vagina is pink and rugated.  Normal discharge. Normal appearing cervix. Uterus is normal in size. No adnexal mass or tenderness. EXTREMITIES: No cyanosis, clubbing, or edema, 2+ distal pulses.  A/P 31 yo with DUB - pelvic ultrasound ordered - Discussed medical management with contraception to regulate her cycles. Patient opted for COC.  Rx Sprintec provided for a 3-6 month trial - RTC in August for follow up and annual exam

## 2016-11-04 LAB — POCT PREGNANCY, URINE: PREG TEST UR: NEGATIVE

## 2016-11-11 ENCOUNTER — Ambulatory Visit (HOSPITAL_COMMUNITY): Payer: Medicaid Other

## 2016-11-13 ENCOUNTER — Ambulatory Visit (HOSPITAL_COMMUNITY)
Admission: RE | Admit: 2016-11-13 | Discharge: 2016-11-13 | Disposition: A | Discharge status: Home / Self Care | Payer: Medicaid Other | Source: Ambulatory Visit | Attending: Obstetrics and Gynecology | Admitting: Obstetrics and Gynecology

## 2016-11-13 DIAGNOSIS — N938 Other specified abnormal uterine and vaginal bleeding: Secondary | ICD-10-CM | POA: Insufficient documentation

## 2016-11-16 ENCOUNTER — Telehealth: Payer: Self-pay | Admitting: *Deleted

## 2016-11-16 NOTE — Telephone Encounter (Addendum)
Called pt and left message that I am calling with test results. Please call back and state whether a detailed message can be left on her voicemail. Pt can be informed that Dr. Macon Large has reviewed her Pelvic US report and it is normal.   4/4  0840  Pt left message yesterday @ 1918 stating that a detailed message could be left on her voicemail. I called this morning and left message stating that Dr. Macon Large has reviewed her Pelvic US report. It was normal and showed no abnormalities.

## 2017-05-19 ENCOUNTER — Ambulatory Visit: Payer: Self-pay | Admitting: Family Medicine

## 2017-06-08 ENCOUNTER — Ambulatory Visit (INDEPENDENT_AMBULATORY_CARE_PROVIDER_SITE_OTHER): Payer: Medicaid Other | Admitting: Medical

## 2017-06-08 ENCOUNTER — Other Ambulatory Visit (HOSPITAL_COMMUNITY)
Admission: RE | Admit: 2017-06-08 | Discharge: 2017-06-08 | Disposition: A | Discharge status: Home / Self Care | Payer: Medicaid Other | Source: Ambulatory Visit | Attending: Medical | Admitting: Medical

## 2017-06-08 ENCOUNTER — Encounter: Payer: Self-pay | Admitting: Medical

## 2017-06-08 VITALS — BP 108/71 | HR 68 | Ht 63.0 in | Wt 154.2 lb

## 2017-06-08 DIAGNOSIS — Z01419 Encounter for gynecological examination (general) (routine) without abnormal findings: Secondary | ICD-10-CM | POA: Insufficient documentation

## 2017-06-08 DIAGNOSIS — Z309 Encounter for contraceptive management, unspecified: Secondary | ICD-10-CM | POA: Diagnosis present

## 2017-06-08 NOTE — Progress Notes (Signed)
Subjective:    Joyce SanesLaquanda Stogsdill is a 31 y.o. female who presents for an annual exam. The patient has no complaints today. The patient is sexually active. GYN screening history: last pap: approximate date 2015 and was normal. The patient wears seatbelts: yes. The patient participates in regular exercise: intermittent. Has the patient ever been transfused or tattooed?: no. The patient reports that there is not domestic violence in her life. BTS in 2017.   Menstrual History: OB History    Gravida Para Term Preterm AB Living   4 2 2  0 2 2   SAB TAB Ectopic Multiple Live Births   0 2 0 0 1      Menarche age: 31 years old Patient's last menstrual period was 05/19/2017 (exact date).    The following portions of the patient's history were reviewed and updated as appropriate: allergies, current medications, past family history, past medical history, past social history, past surgical history and problem list.  Review of Systems Pertinent items are noted in HPI.    Objective:     Physical Exam  Nursing note and vitals reviewed. Constitutional: She is oriented to person, place, and time. She appears well-developed and well-nourished. No distress.  HENT:  Head: Normocephalic and atraumatic.  Eyes: EOM are normal.  Neck: Normal range of motion. Neck supple. No thyromegaly present.  Cardiovascular: Normal rate, regular rhythm and normal heart sounds.   No murmur heard. Respiratory: Effort normal and breath sounds normal. No respiratory distress. She has no wheezes.  GI: Soft. Bowel sounds are normal. She exhibits no distension and no mass. There is no tenderness. There is no rebound and no guarding.  Genitourinary: Uterus is not enlarged and not tender. Cervix exhibits no motion tenderness, no discharge and no friability. Right adnexum displays no mass and no tenderness. Left adnexum displays no mass and no tenderness. No bleeding in the vagina. No vaginal discharge found.  Genitourinary  Comments: Pap smear obtained  Musculoskeletal: She exhibits no edema.  Neurological: She is alert and oriented to person, place, and time.  Skin: Skin is warm and dry. No erythema.  Psychiatric: She has a normal mood and affect.   Assessment:    Healthy female exam.      Plan:     Await pap smear results.   Patient will be contacted with any abnormal results Encouraged to sign-up for MyChart to receive results electronically Follow-up with [facility] in 1 year for annual exam or sooner PRN   Marny LowensteinWenzel, Destanie Tibbetts N, PA-C 06/08/2017 10:26 AM

## 2017-06-08 NOTE — Patient Instructions (Signed)

## 2017-06-09 LAB — CYTOLOGY - PAP
Chlamydia: NEGATIVE
Diagnosis: NEGATIVE
Neisseria Gonorrhea: NEGATIVE

## 2017-06-09 LAB — HEPATITIS C ANTIBODY: Hep C Virus Ab: <0.1 s/co ratio (ref 0.0–0.9)

## 2017-06-09 LAB — HIV ANTIBODY (ROUTINE TESTING W REFLEX): HIV Screen 4th Generation wRfx: NONREACTIVE

## 2017-06-09 LAB — HEPATITIS B SURFACE ANTIGEN: Hepatitis B Surface Ag: NEGATIVE

## 2017-06-09 LAB — SYPHILIS: RPR W/REFLEX TO RPR TITER AND TREPONEMAL ANTIBODIES, TRADITIONAL SCREENING AND DIAGNOSIS ALGORITHM: RPR Ser Ql: NONREACTIVE

## 2017-06-16 ENCOUNTER — Encounter: Payer: Self-pay | Admitting: *Deleted

## 2017-06-21 ENCOUNTER — Telehealth: Payer: Self-pay | Admitting: General Practice

## 2017-06-21 NOTE — Telephone Encounter (Signed)
Patient called and left message requesting results. Called patient, no answer- left message stating we are trying to reach you to return your phone call. We wanted to let you know your results you have requested are normal. You may call us back if you have questions

## 2017-11-21 IMAGING — US US PELVIS COMPLETE
1 series · 15 of 25 positions shown · non-contrast
Comparison: 01/28/2016

CLINICAL DATA: Dysfunctional uterine bleeding since bilateral tubal
ligation and 1.4. Gravida 4 para 2 TAB 2. LMP started 11/05/2016.
Patient is still bleeding.



[Series 1: us pelvis complete · 69 acquisitions, 15 frames shown]
[im 1/69]
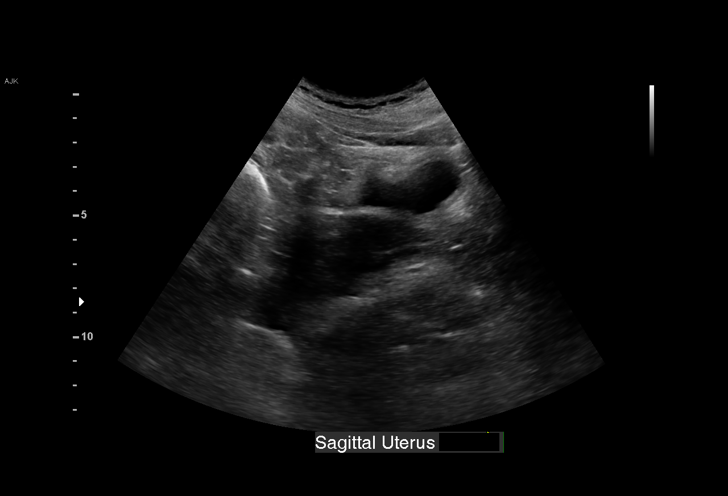
[im 6/69]
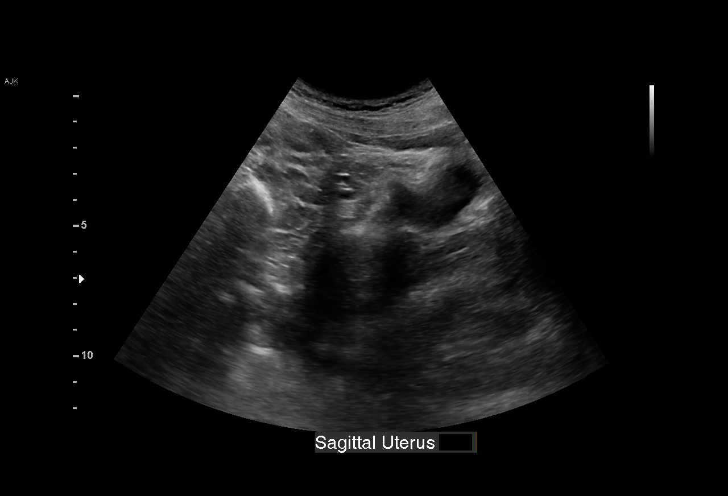
[im 12/69]
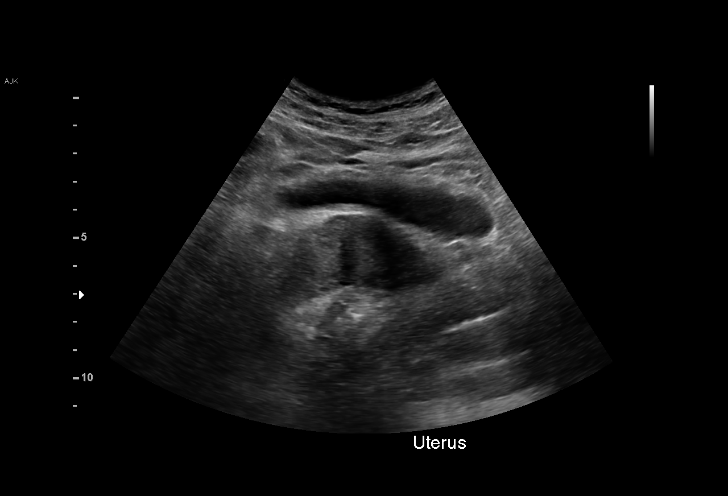
[im 15/69]
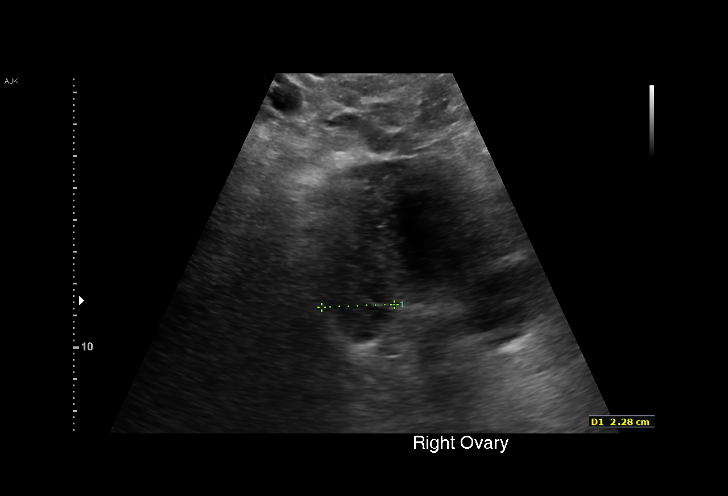
[im 20/69]
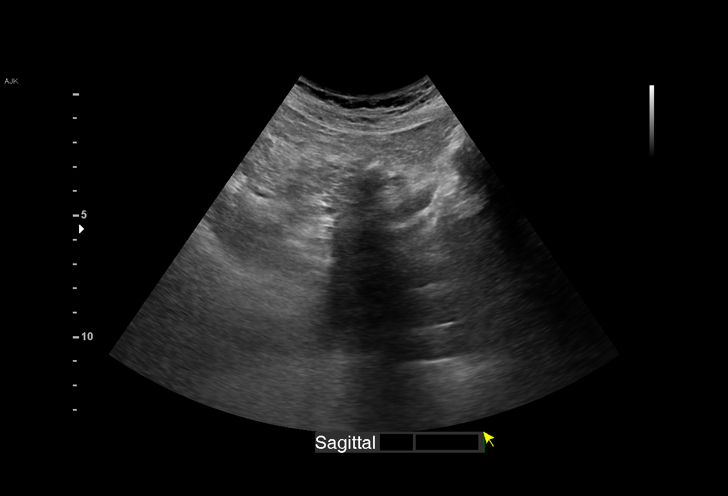
[im 26/69]
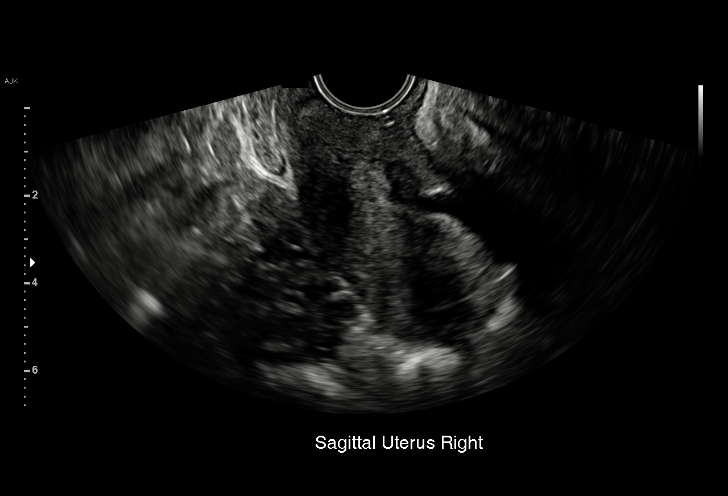
[im 29/69]
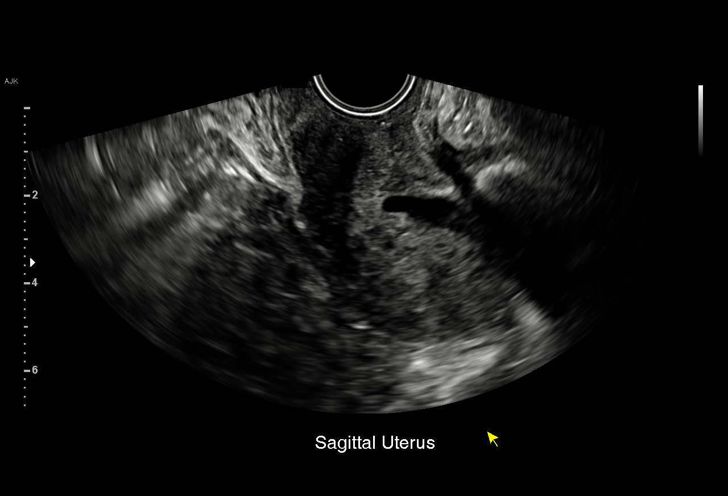
[im 35/69]
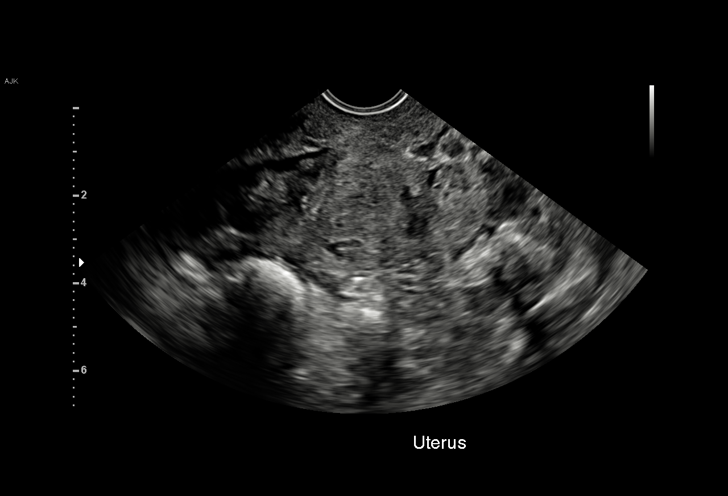
[im 40/69]
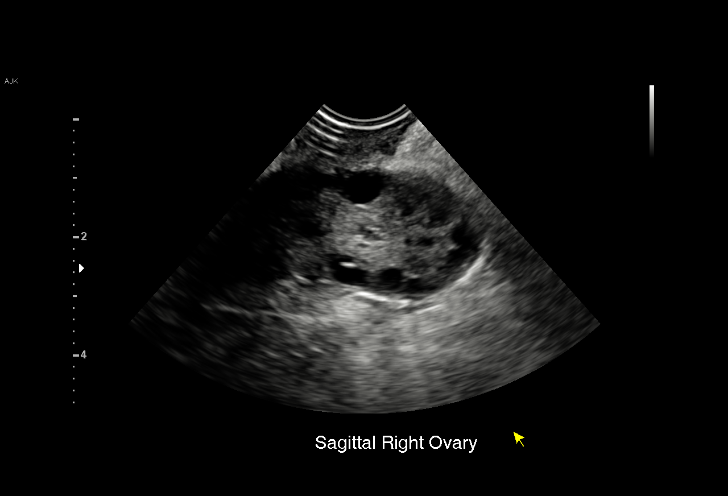
[im 43/69]
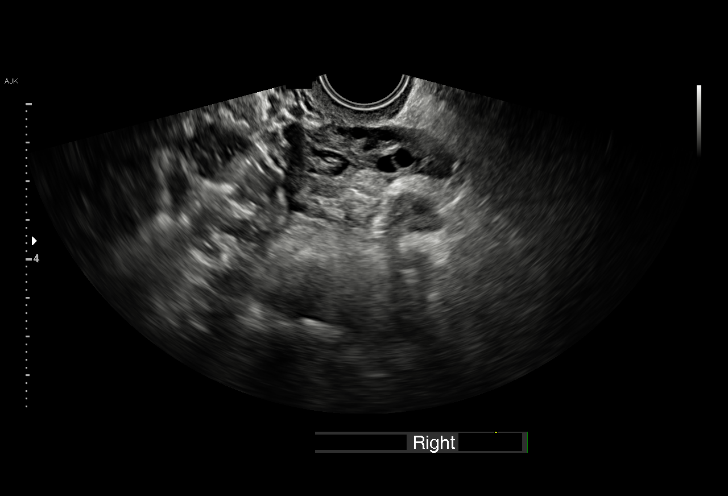
[im 49/69]
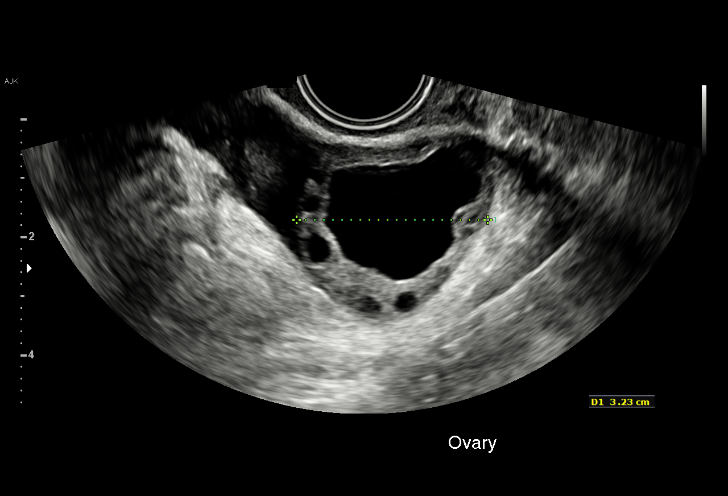
[im 54/69]
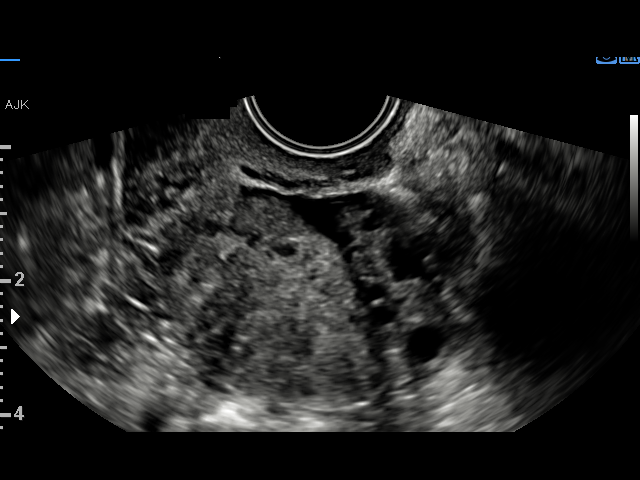
[im 57/69]
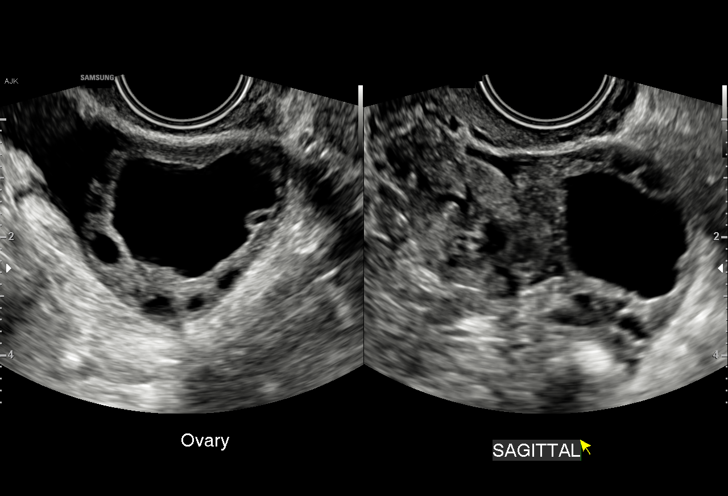
[im 63/69]
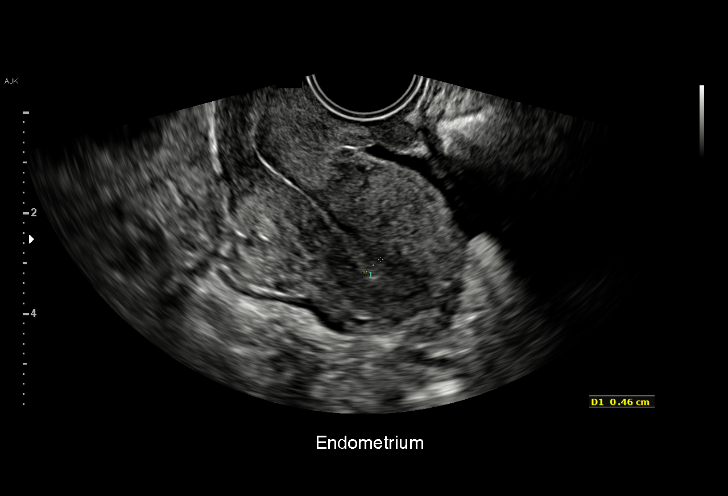
[im 69/69]
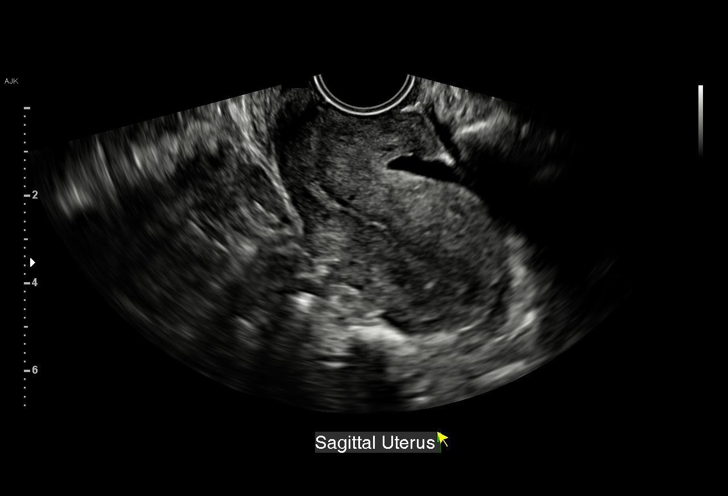

[15 of 25 positions shown; findings below may reference images not displayed]

FINDINGS: Uterus

Measurements: 7.8 x 3.6 x 4.9 cm. No fibroids or other mass
visualized.

Endometrium

Thickness: 5.6 mm.  No focal abnormality visualized.

Right ovary

Measurements: 3.6 x 2.1 x 2.1 cm. Normal appearance/no adnexal mass.

Left ovary

Measurements: 3.9 x 3.2 x 3.2 cm. A cyst measures 2.8 x 2.5 x
cm. Adjacent to left ovary, the left fallopian tube appears mildly
dilated and contains fluid.

Other findings

Small amount of free pelvic fluid noted.
IMPRESSION: 1. Normal appearance of the uterus and endometrium. If bleeding
remains unresponsive to hormonal or medical therapy, sonohysterogram
should be considered for focal lesion work-up. (Ref: Radiological
Reasoning: Algorithmic Workup of Abnormal Vaginal Bleeding with
Endovaginal Sonography and Sonohysterography. AJR [id number]; 191:S68-73)
2. Normal appearance of both ovaries.
3. Small amount of fluid noted within the left fallopian tube.

## 2019-07-02 ENCOUNTER — Emergency Department
Admission: EM | Admit: 2019-07-02 | Discharge: 2019-07-02 | Disposition: A | Discharge status: Home / Self Care | Payer: Medicaid Other | Attending: Emergency Medicine | Admitting: Emergency Medicine

## 2019-07-02 ENCOUNTER — Other Ambulatory Visit: Payer: Self-pay

## 2019-07-02 DIAGNOSIS — B9689 Other specified bacterial agents as the cause of diseases classified elsewhere: Secondary | ICD-10-CM

## 2019-07-02 DIAGNOSIS — N760 Acute vaginitis: Secondary | ICD-10-CM | POA: Insufficient documentation

## 2019-07-02 DIAGNOSIS — N3090 Cystitis, unspecified without hematuria: Secondary | ICD-10-CM | POA: Insufficient documentation

## 2019-07-02 DIAGNOSIS — N3000 Acute cystitis without hematuria: Secondary | ICD-10-CM

## 2019-07-02 LAB — CHLAMYDIA/NGC RT PCR (ARMC ONLY)
Chlamydia Tr: NOT DETECTED
N gonorrhoeae: NOT DETECTED

## 2019-07-02 LAB — COMPREHENSIVE METABOLIC PANEL
ALT: 14 U/L (ref 0–44)
AST: 16 U/L (ref 15–41)
Albumin: 4.1 g/dL (ref 3.5–5.0)
Alkaline Phosphatase: 55 U/L (ref 38–126)
Anion gap: 7 (ref 5–15)
BUN: 15 mg/dL (ref 6–20)
CO2: 24 mmol/L (ref 22–32)
Calcium: 8.7 mg/dL — ABNORMAL LOW (ref 8.9–10.3)
Chloride: 106 mmol/L (ref 98–111)
Creatinine, Ser: 0.74 mg/dL (ref 0.44–1.00)
GFR calc Af Amer: >60 mL/min (ref >60)
GFR calc non Af Amer: >60 mL/min (ref >60)
Glucose, Bld: 103 mg/dL — ABNORMAL HIGH (ref 70–99)
Potassium: 3.6 mmol/L (ref 3.5–5.1)
Sodium: 137 mmol/L (ref 135–145)
Total Bilirubin: 0.5 mg/dL (ref 0.3–1.2)
Total Protein: 7.6 g/dL (ref 6.5–8.1)

## 2019-07-02 LAB — WET PREP, GENITAL
Sperm: NONE SEEN
Trich, Wet Prep: NONE SEEN
Yeast Wet Prep HPF POC: NONE SEEN

## 2019-07-02 LAB — URINALYSIS, COMPLETE (UACMP) WITH MICROSCOPIC
Bilirubin Urine: NEGATIVE
Glucose, UA: NEGATIVE mg/dL
Hgb urine dipstick: NEGATIVE
Ketones, ur: NEGATIVE mg/dL
Nitrite: NEGATIVE
Protein, ur: NEGATIVE mg/dL
Specific Gravity, Urine: 1.026 (ref 1.005–1.030)
pH: 5 (ref 5.0–8.0)

## 2019-07-02 LAB — CBC
HCT: 37.1 % (ref 36.0–46.0)
Hemoglobin: 12.3 g/dL (ref 12.0–15.0)
MCH: 29.6 pg (ref 26.0–34.0)
MCHC: 33.2 g/dL (ref 30.0–36.0)
MCV: 89.4 fL (ref 80.0–100.0)
Platelets: 252 10*3/uL (ref 150–400)
RBC: 4.15 MIL/uL (ref 3.87–5.11)
RDW: 11.5 % (ref 11.5–15.5)
WBC: 7.4 10*3/uL (ref 4.0–10.5)
nRBC: 0 % (ref 0.0–0.2)

## 2019-07-02 LAB — LIPASE, BLOOD: Lipase: 18 U/L (ref 11–51)

## 2019-07-02 LAB — POCT PREGNANCY, URINE: Preg Test, Ur: NEGATIVE

## 2019-07-02 MED ORDER — METRONIDAZOLE 500 MG PO TABS: 500.0000 mg | ORAL_TABLET | Freq: Two times a day (BID) | ORAL | 0 refills | Status: AC

## 2019-07-02 MED ORDER — FLUCONAZOLE 100 MG PO TABS: 100.0000 mg | ORAL_TABLET | Freq: Every day | ORAL | 0 refills | Status: AC

## 2019-07-02 MED ORDER — SODIUM CHLORIDE 0.9% FLUSH: 3.0000 mL | Freq: Once | INTRAVENOUS | Status: DC

## 2019-07-02 MED ORDER — CEPHALEXIN 500 MG PO CAPS: 500.0000 mg | ORAL_CAPSULE | Freq: Two times a day (BID) | ORAL | 0 refills | Status: AC

## 2019-07-02 NOTE — ED Provider Notes (Signed)
[facility] Emergency Department Provider Note  ____________________________________________  Time seen: Approximately 12:37 PM  I have reviewed the triage vital signs and the nursing notes.   HISTORY  Chief Complaint Abdominal Pain    HPI Joyce Rice is a 33 y.o. female that presents to the emergency department for evaluation of some generalized lower abdominal discomfort and low back pain for 1 week.  Patient states that she has felt nauseous.  She has had some frequency of urination.  She is sexually active and monogamous with one partner.  She would still like to be checked for STDs because "you can never be too careful."  She is having some vaginal discharge but not a significant amount more than normal.  She recently reduced her caffeine intake.  No fever, chills, shortness of breath, chest pain, vomiting, diarrhea, constipation.  Past Medical History:  Diagnosis Date  . Bartholin cyst   . Chlamydia   . Chlamydia 12/29/2011   Azithromycin given 12/29/11, vomited. Partner Tx per pt. Rx Doxy 01/29/12.   Marland Kitchen Headache   . Infection    UTI  . Irregular periods/menstrual cycles   . PONV (postoperative nausea and vomiting)     Patient Active Problem List   Diagnosis Date Noted  . Encounter for sterilization     Past Surgical History:  Procedure Laterality Date  . DILATION AND CURETTAGE OF UTERUS    . INDUCED ABORTION    . LAPAROSCOPIC TUBAL LIGATION Bilateral 05/26/2016   Procedure: LAPAROSCOPIC TUBAL LIGATION;  Surgeon: Catalina Antigua, MD;  Location: [facility];  Service: Gynecology;  Laterality: Bilateral;    Prior to Admission medications   Medication Sig Start Date End Date Taking? Authorizing Provider  cephALEXin (KEFLEX) 500 MG capsule Take 1 capsule (500 mg total) by mouth 2 (two) times daily for 10 days. 07/02/19 07/12/19  Enid Derry, PA-C  fluconazole (DIFLUCAN) 100 MG tablet Take 1 tablet (100 mg total) by mouth daily. 07/02/19 08/01/19   Enid Derry, PA-C  metroNIDAZOLE (FLAGYL) 500 MG tablet Take 1 tablet (500 mg total) by mouth 2 (two) times daily for 7 days. 07/02/19 07/09/19  Enid Derry, PA-C    Allergies Patient has no known allergies.  Family History  Problem Relation Age of Onset  . Hearing loss Neg Hx     Social History Social History   Tobacco Use  . Smoking status: Never Smoker  . Smokeless tobacco: Never Used  Substance Use Topics  . Alcohol use: Yes    Comment: once or twice a year  . Drug use: No     Review of Systems  Constitutional: No fever/chills ENT: No upper respiratory complaints. Cardiovascular: No chest pain. Respiratory: No cough. No SOB. Gastrointestinal: Positive for abdominal discomfort and nausea. No vomiting.  Musculoskeletal: Negative for musculoskeletal pain. Skin: Negative for rash, abrasions, lacerations, ecchymosis. Neurological: Negative for headaches   ____________________________________________   PHYSICAL EXAM:  VITAL SIGNS: ED Triage Vitals  Enc Vitals Group     BP 07/02/19 0935 103/60     Pulse Rate 07/02/19 0935 (!) 56     Resp 07/02/19 0935 18     Temp 07/02/19 0935 99.2 F (37.3 C)     Temp Source 07/02/19 0935 Oral     SpO2 07/02/19 0935 98 %     Weight 07/02/19 0938 155 lb (70.3 kg)     Height 07/02/19 0938 5\' 3"  (1.6 m)     Head Circumference --      Peak Flow --  Pain Score 07/02/19 0937 8     Pain Loc --      Pain Edu? --      Excl. in GC? --      Constitutional: Alert and oriented. Well appearing and in no acute distress. Eyes: Conjunctivae are normal. PERRL. EOMI. Head: Atraumatic. ENT:      Ears:      Nose: No congestion/rhinnorhea.      Mouth/Throat: Mucous membranes are moist.  Neck: No stridor.  Cardiovascular: Normal rate, regular rhythm.  Good peripheral circulation. Respiratory: Normal respiratory effort without tachypnea or retractions. Lungs CTAB. Good air entry to the bases with no decreased or absent breath  sounds. Gastrointestinal: Bowel sounds 4 quadrants. Soft and nontender to palpation. No guarding or rigidity. No palpable masses. No distention. No CVA tenderness. Pelvic: No external rashes or lesions.  Thin white discharge in vaginal canal.  No other cervical motion tenderness. Musculoskeletal: Full range of motion to all extremities. No gross deformities appreciated. Neurologic:  Normal speech and language. No gross focal neurologic deficits are appreciated.  Skin:  Skin is warm, dry and intact. No rash noted. Psychiatric: Mood and affect are normal. Speech and behavior are normal. Patient exhibits appropriate insight and judgement.   ____________________________________________   LABS (all labs ordered are listed, but only abnormal results are displayed)  Labs Reviewed  WET PREP, GENITAL - Abnormal; Notable for the following components:      Result Value   Clue Cells Wet Prep HPF POC PRESENT (*)    WBC, Wet Prep HPF POC MODERATE (*)    All other components within normal limits  COMPREHENSIVE METABOLIC PANEL - Abnormal; Notable for the following components:   Glucose, Bld 103 (*)    Calcium 8.7 (*)    All other components within normal limits  URINALYSIS, COMPLETE (UACMP) WITH MICROSCOPIC - Abnormal; Notable for the following components:   Color, Urine YELLOW (*)    APPearance CLOUDY (*)    Leukocytes,Ua MODERATE (*)    Bacteria, UA RARE (*)    All other components within normal limits  CHLAMYDIA/NGC RT PCR ([facility] ONLY)  LIPASE, BLOOD  CBC  POC URINE PREG, ED  POCT PREGNANCY, URINE   ____________________________________________  EKG   ____________________________________________  RADIOLOGY   No results found.  ____________________________________________    PROCEDURES  Procedure(s) performed:    Procedures    Medications  sodium chloride flush (NS) 0.9 % injection 3 mL (3 mLs Intravenous Not Given 07/02/19 1128)      ____________________________________________   INITIAL IMPRESSION / ASSESSMENT AND PLAN / ED COURSE  Pertinent labs & imaging results that were available during my care of the patient were reviewed by me and considered in my medical decision making (see chart for details).  Review of the [place] CSRS was performed in accordance of the [facility] prior to dispensing any controlled drugs.   Patient's diagnosis is consistent with bacterial vaginosis and urinary tract infection.  Lab work largely unremarkable.  Vital signs and exam are reassuring.  Urinalysis consistent with infection.  Wet prep consistent with bacterial vaginosis.  Gonorrhea and Chlamydia tests are pending.  Patient will be discharged home with prescriptions for Keflex, Flagyl.  Patient would also like a prophylactic prescription for Diflucan.  Patient is to follow up with gynecology or primary care as directed. Patient is given ED precautions to return to the ED for any worsening or new symptoms.   Charlotte SanesLaquanda Stenglein was evaluated in Emergency Department on 07/02/2019 for the symptoms  described in the history of present illness. She was evaluated in the context of the global COVID-19 pandemic, which necessitated consideration that the patient might be at risk for infection with the SARS-CoV-2 virus that causes COVID-19. Institutional protocols and algorithms that pertain to the evaluation of patients at risk for COVID-19 are in a state of rapid change based on information released by regulatory bodies including the CDC and federal and state organizations. These policies and algorithms were followed during the patient's care in the ED.  ____________________________________________  FINAL CLINICAL IMPRESSION(S) / ED DIAGNOSES  Final diagnoses:  Acute cystitis without hematuria  Bacterial vaginosis      NEW MEDICATIONS STARTED DURING THIS VISIT:  ED Discharge Orders         Ordered    cephALEXin (KEFLEX) 500 MG capsule  2 times  daily     07/02/19 1417    metroNIDAZOLE (FLAGYL) 500 MG tablet  2 times daily     07/02/19 1417    fluconazole (DIFLUCAN) 100 MG tablet  Daily     07/02/19 1417              This chart was dictated using voice recognition software/Dragon. Despite best efforts to proofread, errors can occur which can change the meaning. Any change was purely unintentional.    Laban Emperor, PA-C 07/02/19 1521    Earleen Newport, MD 07/02/19 1534

## 2019-07-02 NOTE — ED Triage Notes (Signed)
Pt c/o generalized abd pain with N/V since last week, denies diarrhea. Pt is in NAD at present.

## 2019-07-02 NOTE — Discharge Instructions (Signed)
Please take Keflex for urinary tract infection and Flagyl for your bacterial vaginosis.  You can take Diflucan if you develop a yeast infection.

## 2019-07-02 NOTE — ED Notes (Signed)
See triage note  Presents with lower abd and back pain  States pain started last Monday  Did have some n/v   Low grade fever noted on arrival

## 2020-01-22 ENCOUNTER — Emergency Department
Admission: EM | Admit: 2020-01-22 | Discharge: 2020-01-22 | Disposition: A | Discharge status: Home / Self Care | Payer: Managed Care, Other (non HMO) | Attending: Emergency Medicine | Admitting: Emergency Medicine

## 2020-01-22 ENCOUNTER — Other Ambulatory Visit: Payer: Self-pay

## 2020-01-22 ENCOUNTER — Emergency Department: Payer: Managed Care, Other (non HMO)

## 2020-01-22 ENCOUNTER — Encounter: Payer: Self-pay | Admitting: Emergency Medicine

## 2020-01-22 DIAGNOSIS — R0602 Shortness of breath: Secondary | ICD-10-CM | POA: Insufficient documentation

## 2020-01-22 DIAGNOSIS — R05 Cough: Secondary | ICD-10-CM | POA: Insufficient documentation

## 2020-01-22 DIAGNOSIS — R0981 Nasal congestion: Secondary | ICD-10-CM | POA: Diagnosis not present

## 2020-01-22 DIAGNOSIS — Z20822 Contact with and (suspected) exposure to covid-19: Secondary | ICD-10-CM | POA: Insufficient documentation

## 2020-01-22 DIAGNOSIS — R079 Chest pain, unspecified: Secondary | ICD-10-CM | POA: Diagnosis present

## 2020-01-22 LAB — CBC
HCT: 39 % (ref 36.0–46.0)
Hemoglobin: 13 g/dL (ref 12.0–15.0)
MCH: 29.8 pg (ref 26.0–34.0)
MCHC: 33.3 g/dL (ref 30.0–36.0)
MCV: 89.4 fL (ref 80.0–100.0)
Platelets: 231 10*3/uL (ref 150–400)
RBC: 4.36 MIL/uL (ref 3.87–5.11)
RDW: 11.5 % (ref 11.5–15.5)
WBC: 6.3 10*3/uL (ref 4.0–10.5)
nRBC: 0 % (ref 0.0–0.2)

## 2020-01-22 LAB — BASIC METABOLIC PANEL
Anion gap: 7 (ref 5–15)
BUN: 9 mg/dL (ref 6–20)
CO2: 24 mmol/L (ref 22–32)
Calcium: 9 mg/dL (ref 8.9–10.3)
Chloride: 106 mmol/L (ref 98–111)
Creatinine, Ser: 0.78 mg/dL (ref 0.44–1.00)
GFR calc Af Amer: >60 mL/min (ref >60)
GFR calc non Af Amer: >60 mL/min (ref >60)
Glucose, Bld: 98 mg/dL (ref 70–99)
Potassium: 3.5 mmol/L (ref 3.5–5.1)
Sodium: 137 mmol/L (ref 135–145)

## 2020-01-22 LAB — PREGNANCY, URINE: Preg Test, Ur: NEGATIVE

## 2020-01-22 LAB — FIBRIN DERIVATIVES D-DIMER (ARMC ONLY): Fibrin derivatives D-dimer (ARMC): 254.43 ng/mL (FEU) (ref 0.00–499.00)

## 2020-01-22 LAB — TROPONIN I (HIGH SENSITIVITY)
Troponin I (High Sensitivity): <2 ng/L (ref <18)
Troponin I (High Sensitivity): <2 ng/L (ref <18)

## 2020-01-22 MED ORDER — LIDOCAINE VISCOUS HCL 2 % MT SOLN
15.0000 mL | Freq: Once | OROMUCOSAL | Status: AC
  Administered 2020-01-22: 15 mL via ORAL
  Filled 2020-01-22: qty 15

## 2020-01-22 MED ORDER — SODIUM CHLORIDE 0.9% FLUSH: 3.0000 mL | Freq: Once | INTRAVENOUS | Status: DC

## 2020-01-22 MED ORDER — ONDANSETRON 4 MG PO TBDP
4.0000 mg | ORAL_TABLET | Freq: Once | ORAL | Status: AC
  Administered 2020-01-22: 4 mg via ORAL
  Filled 2020-01-22: qty 1

## 2020-01-22 MED ORDER — ALUM & MAG HYDROXIDE-SIMETH 200-200-20 MG/5ML PO SUSP
15.0000 mL | Freq: Once | ORAL | Status: AC
  Administered 2020-01-22: 15 mL via ORAL
  Filled 2020-01-22: qty 30

## 2020-01-22 NOTE — Discharge Instructions (Signed)
It is possible that your symptoms are related to COVID-19.  You should remain out of work until your test result comes back negative.  If you are to test positive for COVID-19, you will need to stay out of work for at least 10 days from the start of your symptoms.  It is also possible that reflux is contributing to your symptoms and you may take over-the-counter Pepcid as needed for this.  Please return to the ER for any new or worsening symptoms.

## 2020-01-22 NOTE — ED Triage Notes (Signed)
First Nurse Note:  ARrives via [facility] from [facility] for ED evaluation of CP and SOB x 3 days.  EKG wnl.  VS wnl.  Per EMS report.

## 2020-01-22 NOTE — ED Notes (Signed)
Pt declined COVID test.

## 2020-01-22 NOTE — ED Triage Notes (Signed)
Pt presnets to ED via [facility] with c/o substernal CP x 3 days, worse when she lays back and SOB. Pt able to speak in full and complete sentences upon arrival. Pt states this morning took Omeprazole and pain became worse. Pt A&O x4, however noted to be unhelpful when this RN attempted to perform EKG in triage.

## 2020-01-22 NOTE — ED Provider Notes (Signed)
[facility] Emergency Department Provider Note   ____________________________________________   First MD Initiated Contact with Patient 01/22/20 1319     (approximate)  I have reviewed the triage vital signs and the nursing notes.   HISTORY  Chief Complaint Chest Pain    HPI Joyce Rice is a 34 y.o. female with no significant past medical history who presents to the ED complaining of chest pain or shortness of breath.  Patient reports that he has had constant sharp pain in the center of her chest for the past 3 days.  Pain is exacerbated when she goes to lay on her back and also when she tries to eat something.  It is been associated with a nonproductive cough as well as some congestion and difficulty breathing.  She thought it could be related to reflux and took Prilosec earlier this morning, but states that made her symptoms worse.  She initially presented to an urgent care, but was referred to the ED for further evaluation after she vomited there.  She denies any fevers, diarrhea, abdominal pain, dysuria, or hematuria.  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        Past Medical History:  Diagnosis Date  . Bartholin cyst   . Chlamydia   . Chlamydia 12/29/2011   Azithromycin given 12/29/11, vomited. Partner Tx per pt. Rx Doxy 01/29/12.   Marland Kitchen Headache   . Infection    UTI  . Irregular periods/menstrual cycles   . PONV (postoperative nausea and vomiting)     Patient Active Problem List   Diagnosis Date Noted  . Encounter for sterilization     Past Surgical History:  Procedure Laterality Date  . DILATION AND CURETTAGE OF UTERUS    . INDUCED ABORTION    . LAPAROSCOPIC TUBAL LIGATION Bilateral 05/26/2016   Procedure: LAPAROSCOPIC TUBAL LIGATION;  Surgeon: Catalina Antigua, MD;  Location: [facility];  Service: Gynecology;  Laterality: Bilateral;    Prior to Admission  medications   Not on File    Allergies Patient has no known allergies.  Family History  Problem Relation Age of Onset  . Hearing loss Neg Hx     Social History Social History   Tobacco Use  . Smoking status: Never Smoker  . Smokeless tobacco: Never Used  Substance Use Topics  . Alcohol use: Yes    Comment: once or twice a year  . Drug use: No    Review of Systems  Constitutional: No fever/chills Eyes: No visual changes. ENT: No sore throat. Cardiovascular: Positive for for chest pain. Respiratory: Positive for cough and shortness of breath. Gastrointestinal: No abdominal pain.  Positive for nausea and vomiting.  No diarrhea.  No constipation. Genitourinary: Negative for dysuria. Musculoskeletal: Negative for back pain. Skin: Negative for rash. Neurological: Negative for headaches, focal weakness or numbness.  ____________________________________________   PHYSICAL EXAM:  VITAL SIGNS: ED Triage Vitals  Enc Vitals Group     BP 01/22/20 1240 (!) 107/54     Pulse Rate 01/22/20 1240 73     Resp 01/22/20 1240 18     Temp 01/22/20 1240 99.6 F (37.6 C)     Temp Source 01/22/20 1240 Oral     SpO2 01/22/20 1240 98 %     Weight 01/22/20 1243 166 lb (75.3 kg)     Height 01/22/20 1243 5\' 3"  (1.6 m)     Head Circumference --  Peak Flow --      Pain Score 01/22/20 1241 10     Pain Loc --      Pain Edu? --      Excl. in [place]? --     Constitutional: Alert and oriented. Eyes: Conjunctivae are normal. Head: Atraumatic. Nose: No congestion/rhinnorhea. Mouth/Throat: Mucous membranes are moist. Neck: Normal ROM Cardiovascular: Normal rate, regular rhythm. Grossly normal heart sounds. Respiratory: Normal respiratory effort.  No retractions. Lungs CTAB. Gastrointestinal: Soft and nontender. No distention. Genitourinary: deferred Musculoskeletal: No lower extremity tenderness nor edema. Neurologic:  Normal speech and language. No gross focal neurologic deficits are  appreciated. Skin:  Skin is warm, dry and intact. No rash noted. Psychiatric: Mood and affect are normal. Speech and behavior are normal.  ____________________________________________   LABS (all labs ordered are listed, but only abnormal results are displayed)  Labs Reviewed  SARS CORONAVIRUS 2 (TAT 6-24 HRS)  BASIC METABOLIC PANEL  CBC  FIBRIN DERIVATIVES D-DIMER ([facility] ONLY)  PREGNANCY, URINE  POC SARS CORONAVIRUS 2 AG -  ED  TROPONIN I (HIGH SENSITIVITY)  TROPONIN I (HIGH SENSITIVITY)   ____________________________________________  EKG  ED ECG REPORT I, Blake Divine, the attending physician, personally viewed and interpreted this ECG.   Date: 01/22/2020  EKG Time: 12:39  Rate: 62  Rhythm: normal sinus rhythm  Axis: Normal  Intervals:none  ST&T Change: None  PROCEDURES  Procedure(s) performed (including Critical Care):  Procedures   ____________________________________________   INITIAL IMPRESSION / ASSESSMENT AND PLAN / ED COURSE       34 year old female with no significant past medical history presents to the ED complaining of constant sharp chest pain for the past 3 days that is exacerbated when she goes to lay flat as well as when she eats or drinks something.  This is been associated with some cough, shortness of breath, and congestion.  Differential includes COVID-19 given her recent exposure with respiratory symptoms, will perform chest x-ray and point-of-care Covid testing.  Low suspicion for ACS given EKG is unremarkable and patient with atypical symptoms.  Doubt PE as she is PERC negative.  Also consider reflux and will treat with GI cocktail plus Zofran.  Plan to check basic labs and troponin.  Patient continued to have symptoms despite GI cocktail.  I doubt PE as she is low risk by Wells and D-dimer within normal limits.  2 sets of troponin are also negative.  She describes the pain as a burning and it is possibly related to GERD, COVID-19 also  remains in the differential and testing is pending.  Patient was counseled to quarantine until the results of her test are known, also counseled to take Pepcid as needed for her symptoms and establish care with a PCP.  Patient counseled to return to the ED for new or worsening symptoms, patient agrees with plan.      ____________________________________________   FINAL CLINICAL IMPRESSION(S) / ED DIAGNOSES  Final diagnoses:  Nonspecific chest pain     ED Discharge Orders    None       Note:  This document was prepared using Dragon voice recognition software and may include unintentional dictation errors.   Blake Divine, MD 01/22/20 1745

## 2020-01-23 LAB — SARS CORONAVIRUS 2 (TAT 6-24 HRS): SARS Coronavirus 2: NEGATIVE

## 2020-11-22 ENCOUNTER — Ambulatory Visit: Payer: BC Managed Care – PPO | Admitting: Medical

## 2020-12-11 ENCOUNTER — Other Ambulatory Visit: Payer: Self-pay

## 2020-12-11 ENCOUNTER — Emergency Department (HOSPITAL_COMMUNITY): Payer: No Typology Code available for payment source

## 2020-12-11 ENCOUNTER — Encounter (HOSPITAL_COMMUNITY): Payer: Self-pay

## 2020-12-11 ENCOUNTER — Emergency Department (HOSPITAL_COMMUNITY)
Admission: EM | Admit: 2020-12-11 | Discharge: 2020-12-11 | Disposition: A | Discharge status: Home / Self Care | Payer: No Typology Code available for payment source | Attending: Emergency Medicine | Admitting: Emergency Medicine

## 2020-12-11 DIAGNOSIS — R110 Nausea: Secondary | ICD-10-CM | POA: Insufficient documentation

## 2020-12-11 DIAGNOSIS — R0789 Other chest pain: Secondary | ICD-10-CM | POA: Diagnosis present

## 2020-12-11 DIAGNOSIS — R0602 Shortness of breath: Secondary | ICD-10-CM | POA: Diagnosis not present

## 2020-12-11 DIAGNOSIS — N390 Urinary tract infection, site not specified: Secondary | ICD-10-CM

## 2020-12-11 LAB — BASIC METABOLIC PANEL
Anion gap: 7 (ref 5–15)
BUN: 11 mg/dL (ref 6–20)
CO2: 27 mmol/L (ref 22–32)
Calcium: 9.1 mg/dL (ref 8.9–10.3)
Chloride: 107 mmol/L (ref 98–111)
Creatinine, Ser: 0.72 mg/dL (ref 0.44–1.00)
GFR, Estimated: >60 mL/min (ref >60)
Glucose, Bld: 104 mg/dL — ABNORMAL HIGH (ref 70–99)
Potassium: 3.8 mmol/L (ref 3.5–5.1)
Sodium: 141 mmol/L (ref 135–145)

## 2020-12-11 LAB — CBC
HCT: 38.2 % (ref 36.0–46.0)
Hemoglobin: 12.6 g/dL (ref 12.0–15.0)
MCH: 30.7 pg (ref 26.0–34.0)
MCHC: 33 g/dL (ref 30.0–36.0)
MCV: 93.2 fL (ref 80.0–100.0)
Platelets: 282 10*3/uL (ref 150–400)
RBC: 4.1 MIL/uL (ref 3.87–5.11)
RDW: 11.7 % (ref 11.5–15.5)
WBC: 8.9 10*3/uL (ref 4.0–10.5)
nRBC: 0 % (ref 0.0–0.2)

## 2020-12-11 LAB — WET PREP, GENITAL
Clue Cells Wet Prep HPF POC: NONE SEEN
Sperm: NONE SEEN
Trich, Wet Prep: NONE SEEN
Yeast Wet Prep HPF POC: NONE SEEN

## 2020-12-11 LAB — URINALYSIS, ROUTINE W REFLEX MICROSCOPIC
Bilirubin Urine: NEGATIVE
Glucose, UA: NEGATIVE mg/dL
Hgb urine dipstick: NEGATIVE
Ketones, ur: NEGATIVE mg/dL
Nitrite: NEGATIVE
Protein, ur: NEGATIVE mg/dL
Specific Gravity, Urine: 1.023 (ref 1.005–1.030)
WBC, UA: >50 WBC/hpf — ABNORMAL HIGH (ref 0–5)
pH: 5 (ref 5.0–8.0)

## 2020-12-11 LAB — I-STAT BETA HCG BLOOD, ED (MC, WL, AP ONLY): I-stat hCG, quantitative: <5 m[IU]/mL (ref <5)

## 2020-12-11 LAB — TSH: TSH: 2.292 u[IU]/mL (ref 0.350–4.500)

## 2020-12-11 LAB — TROPONIN I (HIGH SENSITIVITY)
Troponin I (High Sensitivity): 2 ng/L (ref <18)
Troponin I (High Sensitivity): <2 ng/L (ref <18)

## 2020-12-11 MED ORDER — PANTOPRAZOLE SODIUM 20 MG PO TBEC
20.0000 mg | DELAYED_RELEASE_TABLET | Freq: Every day | ORAL | 0 refills | Status: DC
Start: 2020-12-11 — End: 2021-01-24

## 2020-12-11 MED ORDER — FLUCONAZOLE 150 MG PO TABS
150.0000 mg | ORAL_TABLET | Freq: Once | ORAL | 0 refills | Status: AC
Start: 2020-12-11 — End: 2020-12-11

## 2020-12-11 MED ORDER — CEPHALEXIN 500 MG PO CAPS: ORAL_CAPSULE | ORAL | 0 refills | Status: DC

## 2020-12-11 NOTE — ED Notes (Signed)
Pt ambulatory to and from bathroom.  

## 2020-12-11 NOTE — Discharge Instructions (Addendum)
Take antibiotic as prescribed for urinary tract infection.  If you develop signs of yeast infection, take Diflucan

## 2020-12-11 NOTE — ED Provider Notes (Signed)
[facility]-EMERGENCY DEPT Provider Note   CSN: [id number] Arrival date & time: 12/11/20  1838     History Chief Complaint  Patient presents with  . Chest Pain    Joyce Rice is a 35 y.o. female.  The history is provided by the patient and medical records. No language interpreter was used.  Chest Pain    35 year old female who presents for evaluation of chest pain.  Patient reports she has had intermittent chest pain ongoing for the past several months.  She described pain as a sharp sensation sometimes to the left side of her chest and sometimes to the right side of the chest and today is more to the left upper chest.  Pain can be lasting for seconds to hours sometimes associate with nausea and some mild shortness of breath.  For the past several days she also noticed that her hands will turn blue and she would have to shower herself to warm up.  She was planning to follow-up with her primary care provider but missed her appointment and the next appointment is not until next month.  She is actively having chest discomfort at this time.  She denies alcohol or tobacco use.  She does not have any significant cardiac history.  No history of thyroid disease.  No fever chills productive cough abdominal pain or rash.  She is unsure if the pain is related to eating.  Past Medical History:  Diagnosis Date  . Bartholin cyst   . Chlamydia   . Chlamydia 12/29/2011   Azithromycin given 12/29/11, vomited. Partner Tx per pt. Rx Doxy 01/29/12.   Marland Kitchen Headache   . Infection    UTI  . Irregular periods/menstrual cycles   . PONV (postoperative nausea and vomiting)     Patient Active Problem List   Diagnosis Date Noted  . Encounter for sterilization     Past Surgical History:  Procedure Laterality Date  . DILATION AND CURETTAGE OF UTERUS    . INDUCED ABORTION    . LAPAROSCOPIC TUBAL LIGATION Bilateral 05/26/2016   Procedure: LAPAROSCOPIC TUBAL LIGATION;  Surgeon: Catalina Antigua, MD;  Location: [facility];  Service: Gynecology;  Laterality: Bilateral;     OB History    Gravida  4   Para  2   Term  2   Preterm  0   AB  2   Living  2     SAB  0   IAB  2   Ectopic  0   Multiple  0   Live Births  1           Family History  Problem Relation Age of Onset  . Hearing loss Neg Hx     Social History   Tobacco Use  . Smoking status: Never Smoker  . Smokeless tobacco: Never Used  Substance Use Topics  . Alcohol use: Yes    Comment: once or twice a year  . Drug use: No    Home Medications Prior to Admission medications   Not on File    Allergies    Patient has no known allergies.  Review of Systems   Review of Systems  Cardiovascular: Positive for chest pain.  All other systems reviewed and are negative.   Physical Exam Updated Vital Signs BP 119/75 (BP Location: Right Arm)   Pulse 75   Temp 98.9 F (37.2 C) (Oral)   Resp 16   Ht 5\' 3"  (1.6 m)   Wt 77.1 kg  SpO2 100%   BMI 30.11 kg/m   Physical Exam Vitals and nursing note reviewed.  Constitutional:      General: She is not in acute distress.    Appearance: She is well-developed.  HENT:     Head: Atraumatic.  Eyes:     Conjunctiva/sclera: Conjunctivae normal.  Neck:     Thyroid: No thyromegaly.  Cardiovascular:     Rate and Rhythm: Normal rate and regular rhythm.     Pulses: Normal pulses.     Heart sounds: Normal heart sounds.  Pulmonary:     Effort: Pulmonary effort is normal.     Breath sounds: Normal breath sounds.  Chest:     Chest wall: No tenderness.  Abdominal:     Palpations: Abdomen is soft.     Tenderness: There is no abdominal tenderness.  Musculoskeletal:        General: Normal range of motion.     Cervical back: Normal range of motion and neck supple.  Skin:    General: Skin is warm.     Findings: No rash.  Neurological:     Mental Status: She is alert. Mental status is at baseline.  Psychiatric:        Mood and Affect: Mood  normal.     ED Results / Procedures / Treatments   Labs (all labs ordered are listed, but only abnormal results are displayed) Labs Reviewed  BASIC METABOLIC PANEL - Abnormal; Notable for the following components:      Result Value   Glucose, Bld 104 (*)    All other components within normal limits  CBC  TSH  I-STAT BETA HCG BLOOD, ED ([facility], [facility], [facility] ONLY)  TROPONIN I (HIGH SENSITIVITY)  TROPONIN I (HIGH SENSITIVITY)    EKG None   Date: 12/11/2020  Rate: 80  Rhythm: normal sinus rhythm  QRS Axis: normal  Intervals: normal  ST/T Wave abnormalities: normal  Conduction Disutrbances: none  Narrative Interpretation:   Old EKG Reviewed: No significant changes noted     Radiology DG Chest 2 View  Result Date: 12/11/2020 CLINICAL DATA:  Intermittent chest pain for a few months. EXAM: CHEST - 2 VIEW COMPARISON:  01/22/2020 FINDINGS: The heart size and mediastinal contours are within normal limits. Both lungs are clear. The visualized skeletal structures are unremarkable. IMPRESSION: No active cardiopulmonary disease. Electronically Signed   By: Burman Nieves M.D.   On: 12/11/2020 20:02    Procedures Pelvic exam  Date/Time: 12/11/2020 9:39 PM Performed by: Fayrene Helper, PA-C Authorized by: Fayrene Helper, PA-C  Comments: Pelvic exam performed with permission of pt and female ED RN Jae Dire assist during exam.  External genitalia w/out lesions.  Vaginal vault with moderate discharge.  Cervix w/out lesions, not friable, GC/Chlamydia and wet prep obtained and sent to lab.  Bimanual exam w/out CMT, uterine or adnexal tenderness       Medications Ordered in ED Medications - No data to display  ED Course  I have reviewed the triage vital signs and the nursing notes.  Pertinent labs & imaging results that were available during my care of the patient were reviewed by me and considered in my medical decision making (see chart for details).    MDM Rules/Calculators/A&P                           BP 105/73   Pulse 78   Temp 98.9 F (37.2 C) (Oral)   Resp 20  Ht 5\' 3"  (1.6 m)   Wt 77.1 kg   LMP 11/28/2020   SpO2 99%   BMI 30.11 kg/m   Final Clinical Impression(s) / ED Diagnoses Final diagnoses:  Atypical chest pain  Acute lower UTI    Rx / DC Orders ED Discharge Orders         Ordered    pantoprazole (PROTONIX) 20 MG tablet  Daily        12/11/20 2307    cephALEXin (KEFLEX) 500 MG capsule        12/11/20 2307    fluconazole (DIFLUCAN) 150 MG tablet   Once        12/11/20 2309         7:35 PM Patient here with recurrent chest pain, atypical of ACS.  Also complaining of her hands turning blue.  On exam, skin color appears normal with brisk cap refill throughout all digits.  Possibly Raynaud's however I am unable to confirm as her skin are of normal appearance at this time.  9:11 PM EKG without concerning ischemic changes, pregnancy test is negative, electrolyte panels are reassuring, no evidence of anemia, troponin unremarkable, normal TSH, chest x-ray unremarkable. HEAR score of 1, low risk of MACE.  sxs not consistent with PE.    9:39 PM Cardiac work-up unremarkable.  Patient now request for urinalysis and states that she thinks she has a UTI.  She denies any dysuria but noticed some vaginal discharge.  She also requests an STI screen.  Pelvic performed with moderate vaginal discharge but no significant discomfort to suggest PID.  11:04 PM UA with  evidence of UTI, will treat with Keflex.  Pt otherwise stable for discharge.  Return precaution given.  Patient also request Diflucan to be prescribed as she is prone for yeast infection.   2310, PA-C 12/11/20 2310    12/13/20, MD 12/11/20 [id number]

## 2020-12-11 NOTE — ED Triage Notes (Signed)
Pt c/o intermittent chest pain for the past few months. Pt states last Friday 4/22 her hands turned purple. Pt states the chest pain is sharp when she has it.

## 2020-12-12 ENCOUNTER — Other Ambulatory Visit: Payer: Self-pay | Admitting: Certified Nurse Midwife

## 2020-12-12 DIAGNOSIS — A749 Chlamydial infection, unspecified: Secondary | ICD-10-CM

## 2020-12-12 LAB — GC/CHLAMYDIA PROBE AMP (~~LOC~~) NOT AT ARMC
Chlamydia: POSITIVE — AB
Comment: NEGATIVE
Comment: NORMAL
Neisseria Gonorrhea: NEGATIVE

## 2020-12-12 MED ORDER — AZITHROMYCIN 500 MG PO TABS: 1000.0000 mg | ORAL_TABLET | Freq: Once | ORAL | 0 refills | Status: AC

## 2020-12-13 ENCOUNTER — Other Ambulatory Visit: Payer: Self-pay | Admitting: Obstetrics and Gynecology

## 2020-12-13 MED ORDER — AZITHROMYCIN 500 MG PO TABS
1000.0000 mg | ORAL_TABLET | Freq: Once | ORAL | 0 refills | Status: AC
Start: 2020-12-13 — End: 2020-12-13

## 2020-12-13 NOTE — Progress Notes (Signed)
+  chlamydia, RX sent   Heman Que, Harolyn Rutherford, NP 12/13/2020 8:11 AM

## 2021-01-18 ENCOUNTER — Other Ambulatory Visit: Payer: Self-pay

## 2021-01-18 ENCOUNTER — Encounter: Payer: Self-pay | Admitting: Emergency Medicine

## 2021-01-18 ENCOUNTER — Emergency Department: Payer: No Typology Code available for payment source

## 2021-01-18 DIAGNOSIS — W1830XA Fall on same level, unspecified, initial encounter: Secondary | ICD-10-CM | POA: Insufficient documentation

## 2021-01-18 DIAGNOSIS — S82832A Other fracture of upper and lower end of left fibula, initial encounter for closed fracture: Secondary | ICD-10-CM | POA: Diagnosis not present

## 2021-01-18 DIAGNOSIS — Y9354 Activity, bowling: Secondary | ICD-10-CM | POA: Insufficient documentation

## 2021-01-18 DIAGNOSIS — S99912A Unspecified injury of left ankle, initial encounter: Secondary | ICD-10-CM | POA: Diagnosis present

## 2021-01-18 MED ORDER — OXYCODONE-ACETAMINOPHEN 5-325 MG PO TABS
1.0000 | ORAL_TABLET | Freq: Once | ORAL | Status: AC
  Administered 2021-01-18: 1 via ORAL
  Filled 2021-01-18: qty 1

## 2021-01-18 NOTE — ED Triage Notes (Signed)
Pt reports that she [place]. She was unable to get up on her own. Ankle is swollen.

## 2021-01-19 ENCOUNTER — Emergency Department
Admission: EM | Admit: 2021-01-19 | Discharge: 2021-01-19 | Disposition: A | Discharge status: Home / Self Care | Payer: No Typology Code available for payment source | Attending: Emergency Medicine | Admitting: Emergency Medicine

## 2021-01-19 DIAGNOSIS — S82832A Other fracture of upper and lower end of left fibula, initial encounter for closed fracture: Secondary | ICD-10-CM

## 2021-01-19 MED ORDER — IBUPROFEN 600 MG PO TABS: 600.0000 mg | ORAL_TABLET | Freq: Three times a day (TID) | ORAL | 0 refills | Status: DC | PRN

## 2021-01-19 MED ORDER — KETOROLAC TROMETHAMINE 60 MG/2ML IM SOLN
60.0000 mg | Freq: Once | INTRAMUSCULAR | Status: AC
  Administered 2021-01-19: 60 mg via INTRAMUSCULAR
  Filled 2021-01-19: qty 2

## 2021-01-19 MED ORDER — OXYCODONE-ACETAMINOPHEN 5-325 MG PO TABS: 1.0000 | ORAL_TABLET | ORAL | 0 refills | Status: DC | PRN

## 2021-01-19 MED ORDER — OXYCODONE-ACETAMINOPHEN 5-325 MG PO TABS
1.0000 | ORAL_TABLET | Freq: Once | ORAL | Status: AC
  Administered 2021-01-19: 1 via ORAL
  Filled 2021-01-19: qty 1

## 2021-01-19 NOTE — ED Provider Notes (Signed)
[facility] Emergency Department Provider Note   ____________________________________________   Event Date/Time   First MD Initiated Contact with Patient 01/19/21 0119     (approximate)  I have reviewed the triage vital signs and the nursing notes.   HISTORY  Chief Complaint Ankle Pain    HPI Joyce Rice is a 35 y.o. female who presents to the ED status post mechanical fall while bowling.  Presents with left ankle pain and swelling.  Denies striking head or LOC.  Voices no other complaints or injuries.     Past Medical History:  Diagnosis Date  . Bartholin cyst   . Chlamydia   . Chlamydia 12/29/2011   Azithromycin given 12/29/11, vomited. Partner Tx per pt. Rx Doxy 01/29/12.   Marland Kitchen Headache   . Infection    UTI  . Irregular periods/menstrual cycles   . PONV (postoperative nausea and vomiting)     Patient Active Problem List   Diagnosis Date Noted  . Encounter for sterilization     Past Surgical History:  Procedure Laterality Date  . DILATION AND CURETTAGE OF UTERUS    . INDUCED ABORTION    . LAPAROSCOPIC TUBAL LIGATION Bilateral 05/26/2016   Procedure: LAPAROSCOPIC TUBAL LIGATION;  Surgeon: Catalina Antigua, MD;  Location: [facility];  Service: Gynecology;  Laterality: Bilateral;    Prior to Admission medications   Medication Sig Start Date End Date Taking? Authorizing Provider  ibuprofen (ADVIL) 600 MG tablet Take 1 tablet (600 mg total) by mouth every 8 (eight) hours as needed. 01/19/21  Yes Irean Hong, MD  oxyCODONE-acetaminophen (PERCOCET/ROXICET) 5-325 MG tablet Take 1 tablet by mouth every 4 (four) hours as needed for severe pain. 01/19/21  Yes Irean Hong, MD  cephALEXin [facility]) 500 MG capsule 2 caps po bid x 7 days 12/11/20   Fayrene Helper, PA-C  Multiple Vitamin (MULTIVITAMIN WITH MINERALS) TABS tablet Take 1 tablet by mouth daily.    [provider]  pantoprazole (PROTONIX) 20 MG tablet Take 1 tablet (20 mg total) by mouth  daily. 12/11/20   Fayrene Helper, PA-C    Allergies Patient has no known allergies.  Family History  Problem Relation Age of Onset  . Hearing loss Neg Hx     Social History Social History   Tobacco Use  . Smoking status: Never Smoker  . Smokeless tobacco: Never Used  Substance Use Topics  . Alcohol use: Yes    Comment: once or twice a year  . Drug use: No    Review of Systems  Constitutional: No fever/chills Eyes: No visual changes. ENT: No sore throat. Cardiovascular: Denies chest pain. Respiratory: Denies shortness of breath. Gastrointestinal: No abdominal pain.  No nausea, no vomiting.  No diarrhea.  No constipation. Genitourinary: Negative for dysuria. Musculoskeletal: Positive for left ankle pain and swelling.  Negative for back pain. Skin: Negative for rash. Neurological: Negative for headaches, focal weakness or numbness.   ____________________________________________   PHYSICAL EXAM:  VITAL SIGNS: ED Triage Vitals  Enc Vitals Group     BP 01/18/21 2247 (!) 153/99     Pulse Rate 01/18/21 2247 79     Resp 01/18/21 2247 18     Temp 01/18/21 2247 98.3 F (36.8 C)     Temp Source 01/18/21 2247 Oral     SpO2 01/18/21 2247 99 %     Weight 01/18/21 2244 170 lb (77.1 kg)     Height 01/18/21 2244 5\' 3"  (1.6 m)  Head Circumference --      Peak Flow --      Pain Score 01/18/21 2247 10     Pain Loc --      Pain Edu? --      Excl. in [place]? --     Constitutional: Alert and oriented. Well appearing and in no acute distress. Eyes: Conjunctivae are normal. PERRL. EOMI. Head: Atraumatic. Nose: Atraumatic. Mouth/Throat: Mucous membranes are moist.  No dental malocclusion. Neck: No stridor.  No cervical spine tenderness to palpation. Cardiovascular: Normal rate, regular rhythm. Grossly normal heart sounds.  Good peripheral circulation. Respiratory: Normal respiratory effort.  No retractions. Lungs CTAB. Gastrointestinal: Soft and nontender. No distention. No  abdominal bruits. No CVA tenderness. Musculoskeletal:  Moderate swelling to left lateral malleolus with tenderness to palpation and limited range of motion secondary to pain.  2+ distal pulses.  Brisk, less than 5-second capillary refill. Neurologic:  Normal speech and language. No gross focal neurologic deficits are appreciated.  Skin:  Skin is warm, dry and intact. No rash noted. Psychiatric: Mood and affect are normal. Speech and behavior are normal.  ____________________________________________   LABS (all labs ordered are listed, but only abnormal results are displayed)  Labs Reviewed - No data to display ____________________________________________  EKG  None ____________________________________________  RADIOLOGY I, Sharian Delia J, personally viewed and evaluated these images (plain radiographs) as part of my medical decision making, as well as reviewing the written report by the radiologist.  ED MD interpretation: Distal left fibula fracture  Official radiology report(s): DG Ankle Complete Left  Result Date: 01/18/2021 CLINICAL DATA:  Fall, left ankle pain EXAM: LEFT ANKLE COMPLETE - 3+ VIEW COMPARISON:  None. FINDINGS: Three view radiograph left ankle demonstrates an acute, minimally displaced, oblique fracture of the distal left fibular epiphysis with the fracture plane extending obliquely into the a lateral joint space of the ankle mortise below the level of the tibial plafond. Minimal lateral displacement of the a distal fracture fragment. The ankle mortise is not optimally profiled, however, appears aligned. No other fracture or dislocation. Ankle effusion noted. Soft tissue swelling is noted superficial to the lateral malleolus, as well as medially, at the level of the left midfoot. IMPRESSION: Intra-articular oblique minimally displaced fracture of the distal left fibular epiphysis below the level of the tibial plafond with fracture fragments in anatomic alignment. Soft tissue  swelling involving the medial left midfoot, not well assessed on this exam. Electronically Signed   By: Helyn Numbers MD   On: 01/18/2021 23:38    ____________________________________________   PROCEDURES  Procedure(s) performed (including Critical Care):  Procedures   ____________________________________________   INITIAL IMPRESSION / ASSESSMENT AND PLAN / ED COURSE  As part of my medical decision making, I reviewed the following data within the electronic MEDICAL RECORD NUMBER Nursing notes reviewed and incorporated, Old chart reviewed, Radiograph reviewed, Notes from prior ED visits and [place] Controlled Substance Database     35 year old female presenting with left ankle injury.  Distal left fibular fracture noted on x-ray.  Still hurting despite Percocet given in triage.  Will administer IM Toradol, additional Percocet.  Place OCL stirrup splint, crutches and orthopedic follow-up.  Strict return precautions given.  Patient verbalizes understanding agrees with plan of care.      ____________________________________________   FINAL CLINICAL IMPRESSION(S) / ED DIAGNOSES  Final diagnoses:  Closed fracture of distal end of left fibula, unspecified fracture morphology, initial encounter     ED Discharge Orders  Ordered    ibuprofen (ADVIL) 600 MG tablet  Every 8 hours PRN        01/19/21 0125    oxyCODONE-acetaminophen (PERCOCET/ROXICET) 5-325 MG tablet  Every 4 hours PRN        01/19/21 0125           Note:  This document was prepared using Dragon voice recognition software and may include unintentional dictation errors.   Irean Hong, MD 01/19/21 Cleophas Dunker

## 2021-01-19 NOTE — Discharge Instructions (Signed)
1.  You may take pain medicines as needed (Motrin/Percocet). 2.  Keep splint clean and dry. 3.  Elevate affected area and apply ice over splint several times daily x3 days to reduce swelling. 4.  Do not bear weight until seen by the specialist. 5.  Return to the ER for worsening symptoms, persistent vomiting, difficulty breathing or other concerns.

## 2021-01-24 ENCOUNTER — Encounter: Payer: Self-pay | Admitting: Family Medicine

## 2021-01-24 ENCOUNTER — Ambulatory Visit: Payer: No Typology Code available for payment source | Admitting: Family Medicine

## 2021-01-24 VITALS — BP 130/80 | HR 79

## 2021-01-24 DIAGNOSIS — Z8619 Personal history of other infectious and parasitic diseases: Secondary | ICD-10-CM

## 2021-01-24 DIAGNOSIS — K219 Gastro-esophageal reflux disease without esophagitis: Secondary | ICD-10-CM

## 2021-01-24 DIAGNOSIS — Z7689 Persons encountering health services in other specified circumstances: Secondary | ICD-10-CM

## 2021-01-24 DIAGNOSIS — R079 Chest pain, unspecified: Secondary | ICD-10-CM | POA: Diagnosis not present

## 2021-01-24 DIAGNOSIS — S82832A Other fracture of upper and lower end of left fibula, initial encounter for closed fracture: Secondary | ICD-10-CM

## 2021-01-24 DIAGNOSIS — N898 Other specified noninflammatory disorders of vagina: Secondary | ICD-10-CM | POA: Diagnosis not present

## 2021-01-24 DIAGNOSIS — L989 Disorder of the skin and subcutaneous tissue, unspecified: Secondary | ICD-10-CM

## 2021-01-24 MED ORDER — FLUCONAZOLE 150 MG PO TABS
ORAL_TABLET | ORAL | 0 refills | Status: DC
Start: 2021-01-24 — End: 2021-03-07

## 2021-01-24 NOTE — Progress Notes (Signed)
Subjective:    Patient ID: Joyce Rice, female    DOB: [birth year], 35 y.o.   MRN: [id number]  HPI Chief Complaint  Patient presents with   new pt get established    New pt get established, chest pain off and on couple months. Went to ER for chest pains and blue hands and feet   She is new to the practice and here to establish care.   Other providers: OB/GYN Orthopedist   Fracture of distal fibula on 01/19/2021. She has seen an orthopedic surgery and will have surgery soon.   Reports from December 11 2020 until May 2022, her hands and feet were turning "blue". She was not having pain, numbness or tingling.  Later she felt a pulling sensation in her hands and feet. She has pictures of this on her phone. She reported this to the provider who evaluated her in the ED for chest pain.  Reports having a negative cardiac work up.   Denies chest pain and no discoloration of her hands or feet currently.   States she was diagnosed with a UTI in the ED and asked for 2 diflucan due to hx of yeast infections after antibiotics. She was only given one and her symptoms including white, thick vaginal discharge and itching has not cleared up. States she cannot have a pelvic exam today because of her current lower leg fracture and she also started her menstrual cycle and is bleeding heavily.   States she also was prescribed an antibiotic for chlamydia infection and could not take the entire dose. She would like to return to be checked for this as well.   No significant cardiac risk factors. Does not smoke. No first degree relatives with heart disease. PGM and PGF with heart disease   Social history: Lives with spouse and 2 children.  works as Theatre manager for [employer].   Denies fever, chills, dizziness, chest pain, palpitations, shortness of breath, abdominal pain, N/V/D, urinary symptoms.    Reviewed allergies, medications, past medical, surgical, family, and social  history.    Review of Systems Pertinent positives and negatives in the history of present illness.     Objective:   Physical Exam BP 130/80   Pulse 79   LMP 01/23/2021   SpO2 97%  Alert and in no distress. Cardiac exam shows a regular sinus rhythm without murmurs or gallops. Lungs are clear to auscultation. RLE is in a boot and she is sitting in a wheelchair. Skin is warm and dry, no discoloration noted. Unable to perform pelvic exam       Assessment & Plan:  Vaginal itching - Plan: fluconazole (DIFLUCAN) 150 MG tablet - I will treat her with diflucan as requested and if her symptoms do not resolve she will follow up.   History of chlamydia -she was treated but unable to tolerate the entire antibiotic. She will return for testing at her convenience. Cannot tolerate a GU exam today   Intermittent chest pain -resolved. Recent work up in the ED. She will return if symptoms return. Discussed low risk for heart disease based on current risk factors.   Gastroesophageal reflux disease, unspecified whether esophagitis present -most likely the cause for her chest pain. She will take the recommended antacid as needed.   Skin abnormality -pictures reviewed. Symptoms  currently present. She will follow up if this occurs again.   Encounter to establish care  Closed fracture of distal end of left fibula, unspecified fracture morphology, initial encounter

## 2021-03-07 ENCOUNTER — Ambulatory Visit: Payer: No Typology Code available for payment source | Admitting: Family Medicine

## 2021-03-07 ENCOUNTER — Encounter: Payer: Self-pay | Admitting: Family Medicine

## 2021-03-07 ENCOUNTER — Telehealth: Payer: Self-pay | Admitting: Family Medicine

## 2021-03-07 ENCOUNTER — Other Ambulatory Visit: Payer: Self-pay

## 2021-03-07 VITALS — BP 120/78 | HR 85 | Temp 98.2°F | Wt 160.8 lb

## 2021-03-07 DIAGNOSIS — N3000 Acute cystitis without hematuria: Secondary | ICD-10-CM

## 2021-03-07 DIAGNOSIS — R350 Frequency of micturition: Secondary | ICD-10-CM | POA: Diagnosis not present

## 2021-03-07 DIAGNOSIS — N898 Other specified noninflammatory disorders of vagina: Secondary | ICD-10-CM

## 2021-03-07 LAB — POCT URINALYSIS DIP (CLINITEK)
Bilirubin, UA: NEGATIVE
Glucose, UA: NEGATIVE mg/dL
Nitrite, UA: NEGATIVE
POC PROTEIN,UA: 30 — AB
Spec Grav, UA: >=1.03 — AB (ref 1.010–1.025)
Urobilinogen, UA: 0.2 E.U./dL
pH, UA: 6 (ref 5.0–8.0)

## 2021-03-07 MED ORDER — SULFAMETHOXAZOLE-TRIMETHOPRIM 800-160 MG PO TABS: 1.0000 | ORAL_TABLET | Freq: Two times a day (BID) | ORAL | 0 refills | Status: DC

## 2021-03-07 MED ORDER — FLUCONAZOLE 150 MG PO TABS: ORAL_TABLET | ORAL | 0 refills | Status: DC

## 2021-03-07 NOTE — Patient Instructions (Signed)
Try some Azo Standard for a couple of days to help with your urinary symptoms and take all the antibiotic and if you are not back to normal when you finish let us know

## 2021-03-07 NOTE — Telephone Encounter (Signed)
Pt called she forgot to tell you that she would need rx Diflucan also since she will be taking antibiotics

## 2021-03-07 NOTE — Progress Notes (Signed)
   Subjective:    Patient ID: Joyce Rice, female    DOB: [birth year], 35 y.o.   MRN: [id number]  HPI She states that she has a several month history of difficulty with frequency and urgency as well as some dysuria.  Is taken this long to come in mainly because of recent difficulty with a fracture which has interfered with her ability to function.   Review of Systems     Objective:   Physical Exam Alert and in no distress.  Walking cast noted on left leg.  Urine dipstick was equivocal.       Assessment & Plan:  Acute cystitis without hematuria  Frequent urination - Plan: POCT URINALYSIS DIP (CLINITEK) Bactrim was called in.  She is to call if she has further difficulties.

## 2021-07-03 ENCOUNTER — Other Ambulatory Visit: Payer: Self-pay

## 2021-07-03 ENCOUNTER — Encounter: Payer: Self-pay | Admitting: Family Medicine

## 2021-07-03 ENCOUNTER — Ambulatory Visit: Payer: No Typology Code available for payment source | Admitting: Family Medicine

## 2021-07-03 VITALS — BP 120/78 | HR 72 | Temp 98.3°F | Wt 173.6 lb

## 2021-07-03 DIAGNOSIS — R350 Frequency of micturition: Secondary | ICD-10-CM

## 2021-07-03 DIAGNOSIS — N760 Acute vaginitis: Secondary | ICD-10-CM | POA: Diagnosis not present

## 2021-07-03 DIAGNOSIS — B9689 Other specified bacterial agents as the cause of diseases classified elsewhere: Secondary | ICD-10-CM

## 2021-07-03 LAB — POCT URINALYSIS DIP (PROADVANTAGE DEVICE)
Bilirubin, UA: NEGATIVE
Blood, UA: NEGATIVE
Glucose, UA: NEGATIVE mg/dL
Ketones, POC UA: NEGATIVE mg/dL
Nitrite, UA: NEGATIVE
Specific Gravity, Urine: 1.02
Urobilinogen, Ur: 0.2
pH, UA: 6 (ref 5.0–8.0)

## 2021-07-03 MED ORDER — METRONIDAZOLE 500 MG PO TABS: 500.0000 mg | ORAL_TABLET | Freq: Two times a day (BID) | ORAL | 0 refills | Status: AC

## 2021-07-03 NOTE — Progress Notes (Signed)
   Subjective:    Patient ID: Joyce Rice, female    DOB: [birth year], 35 y.o.   MRN: [id number]  HPI She states that 2 weeks ago she started noting urinary frequency and then shortly after that had a vaginal discharge as well as irritation in that area.  She has a previous history of UTI in July.  She has also had BV in the past and thinks it is more of the BV.   Review of Systems     Objective:   Physical Exam Vaginal exam shows mucosa to look entirely normal.  KOH and wet prep was equivocal.  Urine microscopic showed contamination.       Assessment & Plan:  BV (bacterial vaginosis) - Plan: metroNIDAZOLE (FLAGYL) 500 MG tablet  Frequent urination - Plan: POCT Urinalysis DIP (Proadvantage Device) Since her symptoms seem to be mostly BV related I will go ahead and treat that.  She was comfortable with that.

## 2021-08-19 ENCOUNTER — Encounter (HOSPITAL_COMMUNITY): Payer: Self-pay

## 2021-08-19 ENCOUNTER — Ambulatory Visit (INDEPENDENT_AMBULATORY_CARE_PROVIDER_SITE_OTHER): Payer: No Typology Code available for payment source

## 2021-08-19 ENCOUNTER — Other Ambulatory Visit: Payer: Self-pay

## 2021-08-19 ENCOUNTER — Ambulatory Visit (HOSPITAL_COMMUNITY)
Admission: EM | Admit: 2021-08-19 | Discharge: 2021-08-19 | Disposition: A | Discharge status: Home / Self Care | Payer: No Typology Code available for payment source | Attending: Emergency Medicine | Admitting: Emergency Medicine

## 2021-08-19 DIAGNOSIS — J209 Acute bronchitis, unspecified: Secondary | ICD-10-CM | POA: Diagnosis not present

## 2021-08-19 DIAGNOSIS — R059 Cough, unspecified: Secondary | ICD-10-CM | POA: Diagnosis not present

## 2021-08-19 MED ORDER — AEROCHAMBER PLUS MISC: 2 refills | Status: DC

## 2021-08-19 MED ORDER — FLUTICASONE PROPIONATE 50 MCG/ACT NA SUSP: 2.0000 | Freq: Every day | NASAL | 0 refills | Status: DC

## 2021-08-19 MED ORDER — ALBUTEROL SULFATE HFA 108 (90 BASE) MCG/ACT IN AERS: 1.0000 | INHALATION_SPRAY | RESPIRATORY_TRACT | 0 refills | Status: DC | PRN

## 2021-08-19 MED ORDER — HYDROCOD POLST-CPM POLST ER 10-8 MG/5ML PO SUER: 5.0000 mL | Freq: Two times a day (BID) | ORAL | 0 refills | Status: DC | PRN

## 2021-08-19 MED ORDER — PREDNISONE 20 MG PO TABS: 40.0000 mg | ORAL_TABLET | Freq: Every day | ORAL | 0 refills | Status: AC

## 2021-08-19 NOTE — ED Triage Notes (Signed)
Pt presents with non productive cough and congestion for over a week ago.

## 2021-08-19 NOTE — ED Provider Notes (Signed)
HPI  SUBJECTIVE:  Joyce Rice is a 36 y.o. female who presents with constant cough for the past 10 days that was preceded by sore throat.  She reports nasal congestion, rhinorrhea, chest soreness secondary to the cough.  She reports occasional blood-streaked sputum, denies hemoptysis.  Some nausea, no vomiting, diarrhea, abdominal pain.  No fevers, body aches, headaches, sinus pain or pressure no postnasal drip, facial swelling, upper dental pain, wheezing, shortness of breath.  No known COVID or flu exposure.  She got both her COVID and flu vaccines.  No GERD symptoms.  No antibiotics in the past 3 months.  No antipyretic in the past 6 hours.  States that she is unable to sleep at night secondary to the cough.  She is worried about pneumonia.  She has tried NyQuil, Benadryl, cough drops without improvement in her symptoms.  Symptoms worse at night.  Past medical history negative for pulmonary disease, smoking, allergies, GERD.  LMP: 12/19.  Denies possibility being pregnant.  PCP: [facility]    Past Medical History:  Diagnosis Date   Bartholin cyst    Chlamydia    Chlamydia 12/29/2011   Azithromycin given 12/29/11, vomited. Partner Tx per pt. Rx Doxy 01/29/12.    Headache    Infection    UTI   Irregular periods/menstrual cycles    PONV (postoperative nausea and vomiting)     Past Surgical History:  Procedure Laterality Date   DILATION AND CURETTAGE OF UTERUS     INDUCED ABORTION     LAPAROSCOPIC TUBAL LIGATION Bilateral 05/26/2016   Procedure: LAPAROSCOPIC TUBAL LIGATION;  Surgeon: Mora Bellman, MD;  Location: [facility];  Service: Gynecology;  Laterality: Bilateral;    Family History  Problem Relation Age of Onset   Hearing loss Neg Hx     Social History   Tobacco Use   Smoking status: Never   Smokeless tobacco: Never  Substance Use Topics   Alcohol use: Yes    Comment: once or twice a year   Drug use: No    No current facility-administered medications  for this encounter.  Current Outpatient Medications:    albuterol (VENTOLIN HFA) 108 (90 Base) MCG/ACT inhaler, Inhale 1-2 puffs into the lungs every 4 (four) hours as needed for wheezing or shortness of breath., Disp: 1 each, Rfl: 0   chlorpheniramine-HYDROcodone (TUSSIONEX PENNKINETIC ER) 10-8 MG/5ML SUER, Take 5 mLs by mouth every 12 (twelve) hours as needed for cough., Disp: 60 mL, Rfl: 0   fluticasone (FLONASE) 50 MCG/ACT nasal spray, Place 2 sprays into both nostrils daily., Disp: 16 g, Rfl: 0   predniSONE (DELTASONE) 20 MG tablet, Take 2 tablets (40 mg total) by mouth daily with breakfast for 5 days., Disp: 10 tablet, Rfl: 0   Spacer/Aero-Holding Chambers (AEROCHAMBER PLUS) inhaler, Use with inhaler, Disp: 1 each, Rfl: 2   Multiple Vitamin (MULTIVITAMIN WITH MINERALS) TABS tablet, Take 1 tablet by mouth daily., Disp: , Rfl:   No Known Allergies   ROS  As noted in HPI.   Physical Exam  BP 118/69 (BP Location: Left Arm)    Pulse 70    Temp 99.1 F (37.3 C) (Oral)    Resp 17    LMP 08/04/2021    SpO2 98%   Constitutional: Well developed, well nourished, no acute distress.  Coughing. Eyes: PERRL, EOMI, conjunctiva normal bilaterally HENT: Normocephalic, atraumatic,mucus membranes moist.  Positive nasal congestion.  Swollen erythematous, turbinates.  No maxillary, frontal sinus tenderness.  No obvious postnasal drip. Respiratory:  Clear to auscultation bilaterally, no rales, no wheezing, no rhonchi.  Positive anterior, lateral chest wall tenderness Cardiovascular: Normal rate and rhythm, no murmurs, no gallops, no rubs GI: Nondistended skin: No rash, skin intact Musculoskeletal: No deformities Neurologic: Alert & oriented x 3, CN III-XII grossly intact, no motor deficits, sensation grossly intact Psychiatric: Speech and behavior appropriate   ED Course   Medications - No data to display  Orders Placed This Encounter  Procedures   DG Chest 2 View    Standing Status:    Standing    Number of Occurrences:   1    Order Specific Question:   Reason for Exam (SYMPTOM  OR DIAGNOSIS REQUIRED)    Answer:   Cough 10 days rule out pneumonia   No results found for this or any previous visit (from the past 24 hour(s)). DG Chest 2 View  Result Date: 08/19/2021 CLINICAL DATA:  Cough for 10 days.  Rule out pneumonia. EXAM: CHEST - 2 VIEW COMPARISON:  12/11/2020 FINDINGS: The cardiomediastinal contours are normal. Bronchial thickening. Pulmonary vasculature is normal. No consolidation, pleural effusion, or pneumothorax. No acute osseous abnormalities are seen. IMPRESSION: Bronchial thickening suggesting bronchitis. No consolidation. Electronically Signed   By: Narda RutherfordMelanie  Sanford M.D.   On: 08/19/2021 21:02    ED Clinical Impression  1. Acute bronchitis, unspecified organism      ED Assessment/Plan  Patient concerned that she may have a walking pneumonia.  Will obtain chest x-ray due to duration of symptoms.  If negative for pneumonia, will treat as bronchitis with prednisone, bronchodilators, Tussionex, Flonase, saline nasal irrigation and Mucinex D.  There is no evidence of a bacterial sinus infection at this time.  Follow-up with PMD if not better in 5 days, ER return precautions given.  [place] Narcotic database reviewed for this patient, and feel that the risk/benefit ratio today is favorable for proceeding with a prescription for controlled substance.  Four short courses of opiates over the past year, last in August.  Reviewed imaging independently.  Bronchial thickening consistent with bronchitis.  No consolidation.  See radiology report for full details.  Presentation consistent with bronchitis.  There is no pneumonia on x-ray.  Plan as above.  Discussed imaging, MDM, treatment plan, and plan for follow-up with patient Discussed sn/sx that should prompt return to the ED. patient agrees with plan.   Meds ordered this encounter  Medications   albuterol (VENTOLIN HFA) 108  (90 Base) MCG/ACT inhaler    Sig: Inhale 1-2 puffs into the lungs every 4 (four) hours as needed for wheezing or shortness of breath.    Dispense:  1 each    Refill:  0   chlorpheniramine-HYDROcodone (TUSSIONEX PENNKINETIC ER) 10-8 MG/5ML SUER    Sig: Take 5 mLs by mouth every 12 (twelve) hours as needed for cough.    Dispense:  60 mL    Refill:  0   Spacer/Aero-Holding Chambers (AEROCHAMBER PLUS) inhaler    Sig: Use with inhaler    Dispense:  1 each    Refill:  2    Please educate patient on use   fluticasone (FLONASE) 50 MCG/ACT nasal spray    Sig: Place 2 sprays into both nostrils daily.    Dispense:  16 g    Refill:  0   predniSONE (DELTASONE) 20 MG tablet    Sig: Take 2 tablets (40 mg total) by mouth daily with breakfast for 5 days.    Dispense:  10 tablet    Refill:  0      *This clinic note was created using [employer]. Therefore, there may be occasional mistakes despite careful proofreading. ?    Melynda Ripple, MD 08/21/21 [phone]

## 2021-08-19 NOTE — Discharge Instructions (Signed)
Your x-ray was negative for pneumonia.  Take 2 puffs from your albuterol inhaler using your spacer every 4 hours for 2 days, then every 6 hours for 2 days, then as needed.  You may back off on the albuterol if you start to feel better sooner.  Flonase, Mucinex D, saline nasal irrigation with a Milta Deiters Med rinse and distilled water as often as you want for the nasal congestion, postnasal drip.  Finish the prednisone, even if you feel better.  Follow-up with your doctor or here in 5 days if not getting any better and we can consider antibiotics at that time.

## 2021-08-21 ENCOUNTER — Telehealth: Payer: Self-pay | Admitting: Family Medicine

## 2021-08-21 MED ORDER — METRONIDAZOLE 500 MG PO TABS: 500.0000 mg | ORAL_TABLET | Freq: Three times a day (TID) | ORAL | 0 refills | Status: AC

## 2021-08-21 NOTE — Telephone Encounter (Signed)
Pt called and is requesting a refill for flagyl states she is having the same symptoms she had in November, for BV she does not want to come in if she does not have to Pt uses [id number] - [place], [place] - [address] AT [facility] OF [address]

## 2021-08-29 ENCOUNTER — Ambulatory Visit: Payer: No Typology Code available for payment source | Admitting: Family Medicine

## 2021-09-08 ENCOUNTER — Emergency Department (HOSPITAL_COMMUNITY): Payer: No Typology Code available for payment source

## 2021-09-08 ENCOUNTER — Encounter (HOSPITAL_COMMUNITY): Payer: Self-pay

## 2021-09-08 ENCOUNTER — Other Ambulatory Visit: Payer: Self-pay

## 2021-09-08 ENCOUNTER — Emergency Department (HOSPITAL_COMMUNITY)
Admission: EM | Admit: 2021-09-08 | Discharge: 2021-09-08 | Disposition: A | Discharge status: Home / Self Care | Payer: No Typology Code available for payment source | Attending: Emergency Medicine | Admitting: Emergency Medicine

## 2021-09-08 DIAGNOSIS — R1031 Right lower quadrant pain: Secondary | ICD-10-CM | POA: Diagnosis present

## 2021-09-08 DIAGNOSIS — Z7951 Long term (current) use of inhaled steroids: Secondary | ICD-10-CM | POA: Insufficient documentation

## 2021-09-08 DIAGNOSIS — N898 Other specified noninflammatory disorders of vagina: Secondary | ICD-10-CM | POA: Insufficient documentation

## 2021-09-08 DIAGNOSIS — K529 Noninfective gastroenteritis and colitis, unspecified: Secondary | ICD-10-CM | POA: Insufficient documentation

## 2021-09-08 DIAGNOSIS — Z79899 Other long term (current) drug therapy: Secondary | ICD-10-CM | POA: Insufficient documentation

## 2021-09-08 LAB — URINALYSIS, ROUTINE W REFLEX MICROSCOPIC
Bilirubin Urine: NEGATIVE
Glucose, UA: NEGATIVE mg/dL
Hgb urine dipstick: NEGATIVE
Ketones, ur: NEGATIVE mg/dL
Nitrite: NEGATIVE
Protein, ur: NEGATIVE mg/dL
Specific Gravity, Urine: 1.023 (ref 1.005–1.030)
pH: 5 (ref 5.0–8.0)

## 2021-09-08 LAB — COMPREHENSIVE METABOLIC PANEL
ALT: 27 U/L (ref 0–44)
AST: 25 U/L (ref 15–41)
Albumin: 4.1 g/dL (ref 3.5–5.0)
Alkaline Phosphatase: 55 U/L (ref 38–126)
Anion gap: 6 (ref 5–15)
BUN: 13 mg/dL (ref 6–20)
CO2: 26 mmol/L (ref 22–32)
Calcium: 9.1 mg/dL (ref 8.9–10.3)
Chloride: 107 mmol/L (ref 98–111)
Creatinine, Ser: 0.61 mg/dL (ref 0.44–1.00)
GFR, Estimated: >60 mL/min (ref >60)
Glucose, Bld: 103 mg/dL — ABNORMAL HIGH (ref 70–99)
Potassium: 3.7 mmol/L (ref 3.5–5.1)
Sodium: 139 mmol/L (ref 135–145)
Total Bilirubin: 0.3 mg/dL (ref 0.3–1.2)
Total Protein: 7.8 g/dL (ref 6.5–8.1)

## 2021-09-08 LAB — CBC
HCT: 39.1 % (ref 36.0–46.0)
Hemoglobin: 13.1 g/dL (ref 12.0–15.0)
MCH: 30.5 pg (ref 26.0–34.0)
MCHC: 33.5 g/dL (ref 30.0–36.0)
MCV: 91.1 fL (ref 80.0–100.0)
Platelets: 278 10*3/uL (ref 150–400)
RBC: 4.29 MIL/uL (ref 3.87–5.11)
RDW: 12.1 % (ref 11.5–15.5)
WBC: 6.4 10*3/uL (ref 4.0–10.5)
nRBC: 0 % (ref 0.0–0.2)

## 2021-09-08 LAB — I-STAT BETA HCG BLOOD, ED (MC, WL, AP ONLY): I-stat hCG, quantitative: <5 m[IU]/mL (ref <5)

## 2021-09-08 LAB — LIPASE, BLOOD: Lipase: 22 U/L (ref 11–51)

## 2021-09-08 LAB — WET PREP, GENITAL
Clue Cells Wet Prep HPF POC: NONE SEEN
Sperm: NONE SEEN
Trich, Wet Prep: NONE SEEN
WBC, Wet Prep HPF POC: <10 (ref <10)
Yeast Wet Prep HPF POC: NONE SEEN

## 2021-09-08 MED ORDER — MORPHINE SULFATE (PF) 4 MG/ML IV SOLN
4.0000 mg | Freq: Once | INTRAVENOUS | Status: AC
  Administered 2021-09-08: 4 mg via INTRAVENOUS
  Filled 2021-09-08: qty 1

## 2021-09-08 MED ORDER — ONDANSETRON HCL 4 MG/2ML IJ SOLN
4.0000 mg | Freq: Once | INTRAMUSCULAR | Status: AC
  Administered 2021-09-08: 4 mg via INTRAVENOUS
  Filled 2021-09-08: qty 2

## 2021-09-08 MED ORDER — HYDROCODONE-ACETAMINOPHEN 5-325 MG PO TABS: 1.0000 | ORAL_TABLET | Freq: Four times a day (QID) | ORAL | 0 refills | Status: DC | PRN

## 2021-09-08 MED ORDER — LACTATED RINGERS IV BOLUS
1000.0000 mL | Freq: Once | INTRAVENOUS | Status: AC
  Administered 2021-09-08: 1000 mL via INTRAVENOUS

## 2021-09-08 MED ORDER — SODIUM CHLORIDE (PF) 0.9 % IJ SOLN
INTRAMUSCULAR | Status: AC
  Filled 2021-09-08: qty 50

## 2021-09-08 MED ORDER — ONDANSETRON HCL 4 MG PO TABS: 4.0000 mg | ORAL_TABLET | Freq: Four times a day (QID) | ORAL | 0 refills | Status: DC

## 2021-09-08 MED ORDER — IOHEXOL 300 MG/ML  SOLN
100.0000 mL | Freq: Once | INTRAMUSCULAR | Status: AC | PRN
  Administered 2021-09-08: 100 mL via INTRAVENOUS

## 2021-09-08 MED ORDER — AMOXICILLIN-POT CLAVULANATE 875-125 MG PO TABS: 1.0000 | ORAL_TABLET | Freq: Two times a day (BID) | ORAL | 0 refills | Status: DC

## 2021-09-08 NOTE — Discharge Instructions (Signed)
Today on your CAT scan it appears that you have colitis which is swelling in your colon.  This is most likely what is causing you to have the symptoms intermittently.  It will be very important for you to follow-up with a gastroenterologist and have a colonoscopy to figure out why this is happening.  It also appears that you had a small ovarian cyst which there is nothing to do about that and it should get better on its own.  You were given a prescription for an antibiotic to take for the colitis and the urinary tract infection.  If you start having high fever, severe pain, persistent vomiting or other concerns return to the emergency room.

## 2021-09-08 NOTE — ED Triage Notes (Signed)
Pt reports lower abdominal pain, nausea, and diarrhea since 3am this morning. Denies vomiting and blood in stool.

## 2021-09-08 NOTE — ED Provider Notes (Signed)
[facility] DEPT Provider Note   CSN: [id number] Arrival date & time: 09/08/21  0740     History  Chief Complaint  Patient presents with   Abdominal Pain    Joyce Rice is a 36 y.o. female.  Patient is a 36 year old female with a history of irregular menses, recurrent abdominal pain that has never been worked up who is presenting today with complaints of abdominal pain.  Patient reports that it became severe last night and feels similar to pain she is having the past but it has been more severe.  It a is a boring deep type of pain mostly in her lower abdomen but does radiate throughout her abdomen.  She reports the pain is present all the time but then will have waves of worsening pain.  It is associated with diarrhea and nausea but she denies any vomiting.  She has not had a fever, or urinary complaints.  She has had some vaginal discharge which has been ongoing and reports a few months ago she saw her PCP about this and was told she probably had BV and was given metronidazole but reports its never fully gone away.  Her last menses was 2 days ago.  She is currently sexually active but has been with the same partner for 15 years.  She does not use protection.  She has had a tubal ligation in the past.  She does report that she was sick with bronchitis last week and had been given multiple medications including a cough syrup, albuterol, Flonase which he had been using but no indication that she was given any antibiotics.  She denies any bad food exposures.  She has never been evaluated for these episodes of abdominal pain.  The history is provided by the patient.  Abdominal Pain     Home Medications Prior to Admission medications   Medication Sig Start Date End Date Taking? Authorizing Provider  albuterol (VENTOLIN HFA) 108 (90 Base) MCG/ACT inhaler Inhale 1-2 puffs into the lungs every 4 (four) hours as needed for wheezing or shortness of breath. 08/19/21    Melynda Ripple, MD  chlorpheniramine-HYDROcodone (TUSSIONEX PENNKINETIC ER) 10-8 MG/5ML SUER Take 5 mLs by mouth every 12 (twelve) hours as needed for cough. 08/19/21   Melynda Ripple, MD  fluticasone (FLONASE) 50 MCG/ACT nasal spray Place 2 sprays into both nostrils daily. 08/19/21   Melynda Ripple, MD  Multiple Vitamin (MULTIVITAMIN WITH MINERALS) TABS tablet Take 1 tablet by mouth daily.    [provider]  Spacer/Aero-Holding Chambers (AEROCHAMBER PLUS) inhaler Use with inhaler 08/19/21   Melynda Ripple, MD      Allergies    Patient has no known allergies.    Review of Systems   Review of Systems  Gastrointestinal:  Positive for abdominal pain.   Physical Exam Updated Vital Signs BP 110/75    Pulse (!) 52    Temp 98 F (36.7 C) (Oral)    Resp 16    LMP 08/31/2021    SpO2 100%  Physical Exam Vitals and nursing note reviewed.  Constitutional:      General: She is not in acute distress.    Appearance: She is well-developed.  HENT:     Head: Normocephalic and atraumatic.  Eyes:     Pupils: Pupils are equal, round, and reactive to light.  Cardiovascular:     Rate and Rhythm: Normal rate and regular rhythm.     Heart sounds: Normal heart sounds. No murmur heard.  No friction rub.  Pulmonary:     Effort: Pulmonary effort is normal.     Breath sounds: Normal breath sounds. No wheezing or rales.  Abdominal:     General: Bowel sounds are increased. There is no distension.     Palpations: Abdomen is soft.     Tenderness: There is abdominal tenderness in the right lower quadrant, periumbilical area, suprapubic area and left lower quadrant. There is no guarding or rebound.  Genitourinary:    Vagina: Vaginal discharge present. No tenderness.     Cervix: Normal.     Uterus: Normal.      Adnexa: Right adnexa normal and left adnexa normal.  Musculoskeletal:        General: No tenderness. Normal range of motion.     Comments: No edema  Skin:    General: Skin is warm  and dry.     Findings: No rash.  Neurological:     General: No focal deficit present.     Mental Status: She is alert and oriented to person, place, and time.     Cranial Nerves: No cranial nerve deficit.  Psychiatric:        Mood and Affect: Mood normal.        Behavior: Behavior normal.    ED Results / Procedures / Treatments   Labs (all labs ordered are listed, but only abnormal results are displayed) Labs Reviewed  COMPREHENSIVE METABOLIC PANEL - Abnormal; Notable for the following components:      Result Value   Glucose, Bld 103 (*)    All other components within normal limits  URINALYSIS, ROUTINE W REFLEX MICROSCOPIC - Abnormal; Notable for the following components:   APPearance HAZY (*)    Leukocytes,Ua MODERATE (*)    Bacteria, UA RARE (*)    All other components within normal limits  WET PREP, GENITAL  LIPASE, BLOOD  CBC  I-STAT BETA HCG BLOOD, ED ([facility], [facility], [facility] ONLY)  GC/CHLAMYDIA PROBE AMP ([place]) NOT AT [facility]    EKG None  Radiology CT ABDOMEN PELVIS W CONTRAST  Result Date: 09/08/2021 CLINICAL DATA:  Abdominal pain, nausea, diarrhea EXAM: CT ABDOMEN AND PELVIS WITH CONTRAST TECHNIQUE: Multidetector CT imaging of the abdomen and pelvis was performed using the standard protocol following bolus administration of intravenous contrast. RADIATION DOSE REDUCTION: This exam was performed according to the departmental dose-optimization program which includes automated exposure control, adjustment of the mA and/or kV according to patient size and/or use of iterative reconstruction technique. CONTRAST:  153mL OMNIPAQUE IOHEXOL 300 MG/ML  SOLN COMPARISON:  None. FINDINGS: Lower chest: Unremarkable Hepatobiliary: Liver measures 21 cm in length. There is fatty infiltration. There is no dilation of bile ducts. Gallbladder is unremarkable. Pancreas: No focal abnormality is seen. Spleen: Spleen measures 11.1 cm in maximum diameter. Adrenals/Urinary Tract: Adrenals are unremarkable.  There is no hydronephrosis. There is 1.7 cm smooth marginated fluid density lesion in the lower pole of right kidney suggesting renal cyst. There are no demonstrable renal or ureteral stones. Urinary bladder is unremarkable. Stomach/Bowel: Stomach is unremarkable. Small bowel loops are not dilated. Appendix is not dilated. There is mild diffuse wall thickening in the ascending and transverse colon. There is no pericolic stranding or fluid collection. Vascular/Lymphatic: Unremarkable. Reproductive: Uterus is retroverted. There are multiple follicles in both ovaries. There is 1.6 cm fluid density structure with slightly enhancing margins in the right adnexa, possibly suggesting recently ruptured follicle. Trace amount of free fluid is seen in the pelvis. Other: There is  no pneumoperitoneum. Small umbilical hernia containing fat is seen. Musculoskeletal: Unremarkable. IMPRESSION: There is wall thickening in the ascending and transverse colon suggesting inflammatory or infectious colitis. There is no evidence of intestinal obstruction or pneumoperitoneum. Appendix is not dilated. There is no hydronephrosis. Enlarged liver. Possible recent rupture of right ovarian follicle. Other findings as described in the body of the report. Electronically Signed   By: Ernie Avena M.D.   On: 09/08/2021 11:35    Procedures Procedures    Medications Ordered in ED Medications  sodium chloride (PF) 0.9 % injection (has no administration in time range)  morphine 4 MG/ML injection 4 mg (4 mg Intravenous Given 09/08/21 0848)  ondansetron (ZOFRAN) injection 4 mg (4 mg Intravenous Given 09/08/21 0846)  lactated ringers bolus 1,000 mL (0 mLs Intravenous Stopped 09/08/21 1201)  iohexol (OMNIPAQUE) 300 MG/ML solution 100 mL (100 mLs Intravenous Contrast Given 09/08/21 1054)    ED Course/ Medical Decision Making/ A&P                           Medical Decision Making Amount and/or Complexity of Data Reviewed Labs:  ordered. Radiology: ordered.  Risk Prescription drug management.   Patient is a 36 year old female complaining of abdominal pain today.  She does have pain in more of her lower abdomen and does have some vaginal complaints.  However she is also having diarrhea associated with the symptoms.  Unsure if patient's symptoms are pelvic in nature versus GI.  Vital signs are all normal.  It sounds like this is something that has been ongoing but seems worse today.  Possibility for diverticulitis, renal stones, endometriosis, PID, TOA, lower suspicion at this time for cholecystitis, hepatitis, pancreatitis.  Lower suspicion for appendicitis.  External medical records from patient's most recent doctor's visit was evaluated and she had been given Flagyl for BV but she reports she thinks she may have been misdiagnosed.  She has had no recent antibiotics to suggest C. difficile.  Patient given supportive care.  Labs are pending.  12:30 PM Patient's pelvic exam without significant findings.  Low suspicion for TOA or PID based on her exam.  Only minimal discharge which appears to be more physiologic.  I independently evaluated and interpreted and hCG is negative, lipase within normal limits today, CMP without acute findings, CBC within normal limits, UA does show moderate leukocytes with 11-20 white blood cells but rare bacteria and does not appear to be contaminated.  CT is currently pending for further evaluation.  12:30 PM I independently evaluated the patient's abdominal scan and did not identify appendicitis.  Radiology reports wall thickening in the ascending and transverse colon suggestive of inflammatory or infectious colitis but no evidence of obstruction or appendicitis at this time.  No hydronephrosis but possible recent ruptured right ovarian follicle.  Findings discussed with the patient.  On repeat evaluation she did report that there is some frequency with urination.  Will cover with Augmentin for both  urine and abdominal pathology.  However given patient has had ongoing issues with her bowels concerned that this may be inflammatory bowel disease and will need to follow-up with a GI provider for further testing.  All of patient's questions were answered.  Considered admission but at this time she does not meet criteria.        Final Clinical Impression(s) / ED Diagnoses Final diagnoses:  Colitis    Rx / DC Orders ED Discharge Orders  Ordered    amoxicillin-clavulanate (AUGMENTIN) 875-125 MG tablet  Every 12 hours        09/08/21 1233    ondansetron (ZOFRAN) 4 MG tablet  Every 6 hours        09/08/21 1233    HYDROcodone-acetaminophen (NORCO/VICODIN) 5-325 MG tablet  Every 6 hours PRN        09/08/21 1233    Ambulatory referral to Gastroenterology       Comments: Inflammatory bowel changes   09/08/21 1233              Blanchie Dessert, MD 09/08/21 1237

## 2021-09-08 NOTE — ED Notes (Signed)
Pt states that her pain is a 5/10, states that she doesn't need anything for pain at this time.

## 2021-09-09 ENCOUNTER — Encounter: Payer: Self-pay | Admitting: Gastroenterology

## 2021-09-09 LAB — GC/CHLAMYDIA PROBE AMP (~~LOC~~) NOT AT ARMC
Chlamydia: NEGATIVE
Comment: NEGATIVE
Comment: NORMAL
Neisseria Gonorrhea: NEGATIVE

## 2021-09-11 ENCOUNTER — Telehealth: Payer: Self-pay

## 2021-09-11 MED ORDER — FLUCONAZOLE 150 MG PO TABS: 150.0000 mg | ORAL_TABLET | Freq: Once | ORAL | 0 refills | Status: AC

## 2021-09-11 NOTE — Telephone Encounter (Signed)
Pt aware.

## 2021-09-11 NOTE — Telephone Encounter (Signed)
Pt. Called wanting to know if you could call her in 2 diflucan. She recently was put on an anti biotic and feels like she has a yeast infection. She said this always happens to her went put on an anti biotic. I did offer her an apt for tomorrow pt. Stated she can't come in tomorrow just had to go to the ED earlier this week.

## 2021-09-25 ENCOUNTER — Ambulatory Visit: Payer: No Typology Code available for payment source | Admitting: Gastroenterology

## 2021-10-08 ENCOUNTER — Encounter: Payer: Self-pay | Admitting: Gastroenterology

## 2021-10-08 ENCOUNTER — Ambulatory Visit (INDEPENDENT_AMBULATORY_CARE_PROVIDER_SITE_OTHER): Payer: No Typology Code available for payment source | Admitting: Gastroenterology

## 2021-10-08 VITALS — BP 112/66 | HR 68 | Ht 63.0 in | Wt 168.8 lb

## 2021-10-08 DIAGNOSIS — R109 Unspecified abdominal pain: Secondary | ICD-10-CM | POA: Insufficient documentation

## 2021-10-08 DIAGNOSIS — R197 Diarrhea, unspecified: Secondary | ICD-10-CM

## 2021-10-08 DIAGNOSIS — R9389 Abnormal findings on diagnostic imaging of other specified body structures: Secondary | ICD-10-CM

## 2021-10-08 DIAGNOSIS — K580 Irritable bowel syndrome with diarrhea: Secondary | ICD-10-CM | POA: Insufficient documentation

## 2021-10-08 HISTORY — DX: Unspecified abdominal pain: R10.9

## 2021-10-08 HISTORY — DX: Diarrhea, unspecified: R19.7

## 2021-10-08 MED ORDER — NA SULFATE-K SULFATE-MG SULF 17.5-3.13-1.6 GM/177ML PO SOLN: 1.0000 | Freq: Once | ORAL | 0 refills | Status: AC

## 2021-10-08 NOTE — Progress Notes (Signed)
Agree with the assessment and plan as outlined by Jessica Zehr, PA-C. ? ?Saachi Zale, DO, FACG ? ?

## 2021-10-08 NOTE — Progress Notes (Signed)
10/08/2021 Joyce SanesLaquanda Godbee [id number] 1985/10/09   HISTORY OF PRESENT ILLNESS: This is a 36 year old female who is new to our office.  She been referred here from the ER, Dr. Anitra LauthPlunkett, for evaluation regarding findings of colitis on the CT scan.  She tells me that for the past couple of months she has been having a lot of progressive GI symptoms including nausea, vomiting, generalized abdominal pains and diarrhea.  She was in the ER on 09/08/2021 at which time CBC, CMP, lipase were unremarkable.  CT scan of the abdomen and pelvis with contrast was performed and showed wall thickening in the ascending and transverse colon suggesting inflammatory or infectious colitis.  She also had a possible recent rupture of a right ovarian follicle.  She was treated with a course of Augmentin, which she completed.  Since then her symptoms have improved, but not completely resolved.  She still has some nausea, but no vomiting.  Has intermittent diarrhea.  She says her last bowel movement was yesterday and was normal at that time.  Still has abdominal pains sometimes in the upper abdomen sometimes in the lower abdomen and sometimes into her back.  She denies rectal bleeding.  She has no family history of inflammatory bowel disease.  She says that dating back 6 months ago she was fine at that point and has never really had any other previous GI issues or complaints in the past.   Past Medical History:  Diagnosis Date   Bartholin cyst    Chlamydia    Chlamydia 12/29/2011   Azithromycin given 12/29/11, vomited. Partner Tx per pt. Rx Doxy 01/29/12.    Headache    Infection    UTI   Irregular periods/menstrual cycles    PONV (postoperative nausea and vomiting)    Past Surgical History:  Procedure Laterality Date   DILATION AND CURETTAGE OF UTERUS     INDUCED ABORTION     LAPAROSCOPIC TUBAL LIGATION Bilateral 05/26/2016   Procedure: LAPAROSCOPIC TUBAL LIGATION;  Surgeon: Catalina AntiguaPeggy Constant, MD;  Location: [facility];   Service: Gynecology;  Laterality: Bilateral;    reports that she has never smoked. She has never used smokeless tobacco. She reports current alcohol use. She reports that she does not use drugs. family history is not on file. No Known Allergies    Outpatient Encounter Medications as of 10/08/2021  Medication Sig   albuterol (VENTOLIN HFA) 108 (90 Base) MCG/ACT inhaler Inhale 1-2 puffs into the lungs every 4 (four) hours as needed for wheezing or shortness of breath.   Multiple Vitamin (MULTIVITAMIN WITH MINERALS) TABS tablet Take 1 tablet by mouth daily.   ondansetron (ZOFRAN) 4 MG tablet Take 1 tablet (4 mg total) by mouth every 6 (six) hours.   Spacer/Aero-Holding Chambers (AEROCHAMBER PLUS) inhaler Use with inhaler   [DISCONTINUED] amoxicillin-clavulanate (AUGMENTIN) 875-125 MG tablet Take 1 tablet by mouth every 12 (twelve) hours.   [DISCONTINUED] chlorpheniramine-HYDROcodone (TUSSIONEX PENNKINETIC ER) 10-8 MG/5ML SUER Take 5 mLs by mouth every 12 (twelve) hours as needed for cough.   [DISCONTINUED] fluticasone (FLONASE) 50 MCG/ACT nasal spray Place 2 sprays into both nostrils daily.   [DISCONTINUED] HYDROcodone-acetaminophen (NORCO/VICODIN) 5-325 MG tablet Take 1 tablet by mouth every 6 (six) hours as needed for severe pain.   No facility-administered encounter medications on file as of 10/08/2021.     REVIEW OF SYSTEMS  : All other systems reviewed and negative except where noted in the History of Present Illness.   PHYSICAL EXAM: BP 112/66  Pulse 68    Ht 5\' 3"  (1.6 m)    Wt 168 lb 12.8 oz (76.6 kg)    BMI 29.90 kg/m  General: Well developed AA female in no acute distress Head: Normocephalic and atraumatic Eyes:  Sclerae anicteric, conjunctiva pink. Ears: Normal auditory acuity. Lungs: Clear throughout to auscultation; no W/R/R. Heart: Regular rate and rhythm; no M/R/G. Abdomen: Soft, non-distended.  BS present.  Minimal diffuse TTP. Rectal:  Will be done at the time of  colonoscopy. Musculoskeletal: Symmetrical with no gross deformities  Skin: No lesions on visible extremities Extremities: No edema  Neurological: Alert oriented x 4, grossly non-focal Psychological:  Alert and cooperative. Normal mood and affect  ASSESSMENT AND PLAN: *36 year old female with symptoms of nausea, vomiting, generalized abdominal pain, and diarrhea with a CT scan showing wall thickening in the ascending and transverse colon suggesting  inflammatory or infectious colitis.  Has had some progressive symptoms over the past couple of months.  Was treated with a course of Augmentin after the CT scan and symptoms did improve some, but not completely resolved.  Still with nausea, but no vomiting.  Has intermittent diarrhea, normal bowel movement yesterday, and abdominal pains in different areas.  No family history of IBD.  CBC and CMP are normal at the time of her ED visit.  We will plan for colonoscopy with Dr. Bryan Lemma to look for any residual inflammation, rule out infectious versus chronic inflammatory colitis.  If inflammation has resolved consider that she may have some ongoing symptoms related to postinfectious IBS type of issue.  The risks, benefits, and alternatives to colonoscopy were discussed with the patient and she consents to proceed.    CC:  Henson, Vickie L, PA-C

## 2021-10-08 NOTE — Patient Instructions (Signed)
COLONOSCOPY:   You have been scheduled for a colonoscopy. Please follow written instructions given to you at your visit today.   PREP:   Please pick up your prep supplies at the pharmacy within the next 1-3 days.  INHALERS:   If you use inhalers (even only as needed), please bring them with you on the day of your procedure.  COLONOSCOPY TIPS:  To reduce nausea and dehydration, stay well hydrated for 3-4 days prior to the exam.  To prevent skin/hemorrhoid irritation - prior to wiping, put A&Dointment or vaseline on the toilet paper. Keep a towel or pad on the bed.  BEFORE STARTING YOUR PREP, drink  64oz of clear liquids in the morning. This will help to flush the colon and will ensure you are well hydrated!!!!  NOTE - This is in addition to the fluids required for to complete your prep. Use of a flavored hard candy, such as grape Anise Salvo, can counteract some of the flavor of the prep and may prevent some nausea.   If you are age 32 or younger, your body mass index should be between 19-25. Your Body mass index is 29.9 kg/m. If this is out of the aformentioned range listed, please consider follow up with your Primary Care Provider.   ________________________________________________________  The [facility] providers would like to encourage you to use [facility] to communicate with providers for non-urgent requests or questions.  Due to long hold times on the telephone, sending your provider a message by [facility] may be a faster and more efficient way to get a response.  Please allow 48 business hours for a response.  Please remember that this is for non-urgent requests.  _______________________________________________________

## 2021-10-09 ENCOUNTER — Encounter: Payer: Self-pay | Admitting: Gastroenterology

## 2021-10-14 ENCOUNTER — Ambulatory Visit (AMBULATORY_SURGERY_CENTER): Payer: No Typology Code available for payment source | Admitting: Gastroenterology

## 2021-10-14 ENCOUNTER — Encounter: Payer: Self-pay | Admitting: Gastroenterology

## 2021-10-14 VITALS — BP 101/68 | HR 65 | Temp 97.8°F | Resp 16 | Ht 63.0 in | Wt 168.0 lb

## 2021-10-14 DIAGNOSIS — R933 Abnormal findings on diagnostic imaging of other parts of digestive tract: Secondary | ICD-10-CM

## 2021-10-14 DIAGNOSIS — R9389 Abnormal findings on diagnostic imaging of other specified body structures: Secondary | ICD-10-CM | POA: Diagnosis present

## 2021-10-14 DIAGNOSIS — R197 Diarrhea, unspecified: Secondary | ICD-10-CM

## 2021-10-14 DIAGNOSIS — R1084 Generalized abdominal pain: Secondary | ICD-10-CM

## 2021-10-14 HISTORY — PX: COLONOSCOPY: SHX174

## 2021-10-14 MED ORDER — SODIUM CHLORIDE 0.9 % IV SOLN: 500.0000 mL | Freq: Once | INTRAVENOUS | Status: DC

## 2021-10-14 NOTE — Patient Instructions (Signed)
YOU HAD AN ENDOSCOPIC PROCEDURE TODAY AT THE [facility]:   Refer to the procedure report that was given to you for any specific questions about what was found during the examination.  If the procedure report does not answer your questions, please call your gastroenterologist to clarify.  If you requested that your care partner not be given the details of your procedure findings, then the procedure report has been included in a sealed envelope for you to review at your convenience later. ? ?YOU SHOULD EXPECT: Some feelings of bloating in the abdomen. Passage of more gas than usual.  Walking can help get rid of the air that was put into your GI tract during the procedure and reduce the bloating. If you had a lower endoscopy (such as a colonoscopy or flexible sigmoidoscopy) you may notice spotting of blood in your stool or on the toilet paper. If you underwent a bowel prep for your procedure, you may not have a normal bowel movement for a few days. ? ?Please Note:  You might notice some irritation and congestion in your nose or some drainage.  This is from the oxygen used during your procedure.  There is no need for concern and it should clear up in a day or so. ? ?SYMPTOMS TO REPORT IMMEDIATELY: ? ?Following lower endoscopy (colonoscopy or flexible sigmoidoscopy): ? Excessive amounts of blood in the stool ? Significant tenderness or worsening of abdominal pains ? Swelling of the abdomen that is new, acute ? ? ?For urgent or emergent issues, a gastroenterologist can be reached at any hour by calling [phone]. ?Do not use MyChart messaging for urgent concerns.  ? ? ?DIET:  We do recommend a small meal at first, but then you may proceed to your regular diet.  Drink plenty of fluids but you should avoid alcoholic beverages for 24 hours. ? ?ACTIVITY:  You should plan to take it easy for the rest of today and you should NOT DRIVE or use heavy machinery until tomorrow (because of the sedation medicines  used during the test).   ? ?FOLLOW UP: ?Our staff will call the number listed on your records 48-72 hours following your procedure to check on you and address any questions or concerns that you may have regarding the information given to you following your procedure. If we do not reach you, we will leave a message.  We will attempt to reach you two times.  During this call, we will ask if you have developed any symptoms of COVID 19. If you develop any symptoms (ie: fever, flu-like symptoms, shortness of breath, cough etc.) before then, please call [phone].  If you test positive for Covid 19 in the 2 weeks post procedure, please call and report this information to us.   ? ?If any biopsies were taken you will be contacted by phone or by letter within the next 1-3 weeks.  Please call us at [phone] if you have not heard about the biopsies in 3 weeks.  ? ? ?SIGNATURES/CONFIDENTIALITY: ?You and/or your care partner have signed paperwork which will be entered into your electronic medical record.  These signatures attest to the fact that that the information above on your After Visit Summary has been reviewed and is understood.  Full responsibility of the confidentiality of this discharge information lies with you and/or your care-partner.  ?

## 2021-10-14 NOTE — Progress Notes (Signed)
Called to room to assist during endoscopic procedure.  Patient ID and intended procedure confirmed with present staff. Received instructions for my participation in the procedure from the performing physician.  

## 2021-10-14 NOTE — Progress Notes (Signed)
VS- Joyce Rice. °

## 2021-10-14 NOTE — Progress Notes (Signed)
VSS, transported to PACU °

## 2021-10-14 NOTE — Progress Notes (Signed)
GASTROENTEROLOGY PROCEDURE H&P NOTE   Primary Care Physician: Girtha Rm, PA-C (Inactive)    Reason for Procedure:   Colitis on CT, generalized abdominal pain, diarrhea  Plan:    Colonoscopy  Patient is appropriate for endoscopic procedure(s) in the ambulatory ([place]) setting.  The nature of the procedure, as well as the risks, benefits, and alternatives were carefully and thoroughly reviewed with the patient. Ample time for discussion and questions allowed. The patient understood, was satisfied, and agreed to proceed.     HPI: Joyce Rice is a 36 y.o. female who presents for Colonoscopy for evaluation of colitis on CT on 09/08/2021 along with generalized abdominal pain and diarrhea. Was see in the office on 10/08/2021.Please see note dated then for full details.     Past Medical History:  Diagnosis Date   Bartholin cyst    Chlamydia    Chlamydia 12/29/2011   Azithromycin given 12/29/11, vomited. Partner Tx per pt. Rx Doxy 01/29/12.    Headache    Infection    UTI   Irregular periods/menstrual cycles    PONV (postoperative nausea and vomiting)     Past Surgical History:  Procedure Laterality Date   DILATION AND CURETTAGE OF UTERUS     INDUCED ABORTION     LAPAROSCOPIC TUBAL LIGATION Bilateral 05/26/2016   Procedure: LAPAROSCOPIC TUBAL LIGATION;  Surgeon: Mora Bellman, MD;  Location: [facility];  Service: Gynecology;  Laterality: Bilateral;    Prior to Admission medications   Medication Sig Start Date End Date Taking? Authorizing Provider  Multiple Vitamin (MULTIVITAMIN WITH MINERALS) TABS tablet Take 1 tablet by mouth daily.   Yes [provider]  albuterol (VENTOLIN HFA) 108 (90 Base) MCG/ACT inhaler Inhale 1-2 puffs into the lungs every 4 (four) hours as needed for wheezing or shortness of breath. 08/19/21   Melynda Ripple, MD  ondansetron (ZOFRAN) 4 MG tablet Take 1 tablet (4 mg total) by mouth every 6 (six) hours. Patient not taking: Reported on  10/14/2021 09/08/21   Blanchie Dessert, MD  Spacer/Aero-Holding Chambers (AEROCHAMBER PLUS) inhaler Use with inhaler Patient not taking: Reported on 10/14/2021 08/19/21   Melynda Ripple, MD    Current Outpatient Medications  Medication Sig Dispense Refill   Multiple Vitamin (MULTIVITAMIN WITH MINERALS) TABS tablet Take 1 tablet by mouth daily.     albuterol (VENTOLIN HFA) 108 (90 Base) MCG/ACT inhaler Inhale 1-2 puffs into the lungs every 4 (four) hours as needed for wheezing or shortness of breath. 1 each 0   ondansetron (ZOFRAN) 4 MG tablet Take 1 tablet (4 mg total) by mouth every 6 (six) hours. (Patient not taking: Reported on 10/14/2021) 12 tablet 0   Spacer/Aero-Holding Chambers (AEROCHAMBER PLUS) inhaler Use with inhaler (Patient not taking: Reported on 10/14/2021) 1 each 2   Current Facility-Administered Medications  Medication Dose Route Frequency Provider Last Rate Last Admin   0.9 %  sodium chloride infusion  500 mL Intravenous Once Tilda Samudio V, DO        Allergies as of 10/14/2021   (No Known Allergies)    Family History  Problem Relation Age of Onset   Hearing loss Neg Hx    Colon cancer Neg Hx    Esophageal cancer Neg Hx     Social History   Socioeconomic History   Marital status: Single    Spouse name: Not on file   Number of children: Not on file   Years of education: Not on file   Highest education level: Not on  file  Occupational History   Not on file  Tobacco Use   Smoking status: Never   Smokeless tobacco: Never  Vaping Use   Vaping Use: Never used  Substance and Sexual Activity   Alcohol use: Yes    Comment: once or twice a year   Drug use: No   Sexual activity: Not Currently    Birth control/protection: Surgical  Other Topics Concern   Not on file  Social History Narrative   Not on file   Social Determinants of Health   Financial Resource Strain: Not on file  Food Insecurity: Not on file  Transportation Needs: Not on file  Physical  Activity: Not on file  Stress: Not on file  Social Connections: Not on file  Intimate Partner Violence: Not on file    Physical Exam: Vital signs in last 24 hours: @BP  104/68    Pulse 68    Temp 97.8 F (36.6 C)    Ht 5\' 3"  (1.6 m)    Wt 168 lb (76.2 kg)    LMP 09/25/2021    SpO2 98%    BMI 29.76 kg/m  GEN: NAD EYE: Sclerae anicteric ENT: MMM CV: Non-tachycardic Pulm: CTA b/l GI: Soft, NT/ND NEURO:  Alert & Oriented x 3   Gerrit Heck, DO [facility]   10/14/2021 10:24 AM

## 2021-10-14 NOTE — Op Note (Signed)
[place] Patient Name: Joyce Rice Procedure Date: 10/14/2021 10:42 AM MRN: [id number] Endoscopist: Gerrit Heck , MD Age: 36 Referring MD:  Date of Birth: 06-02-1986 Gender: Female Account #: [id number] Procedure:                Colonoscopy Indications:              Generalized abdominal pain, Abnormal CT of the GI                            tract, Diarrhea Medicines:                Monitored Anesthesia Care Procedure:                Pre-Anesthesia Assessment:                           - Prior to the procedure, a History and Physical                            was performed, and patient medications and                            allergies were reviewed. The patient's tolerance of                            previous anesthesia was also reviewed. The risks                            and benefits of the procedure and the sedation                            options and risks were discussed with the patient.                            All questions were answered, and informed consent                            was obtained. Prior Anticoagulants: The patient has                            taken no previous anticoagulant or antiplatelet                            agents. ASA Grade Assessment: I - A normal, healthy                            patient. After reviewing the risks and benefits,                            the patient was deemed in satisfactory condition to                            undergo the procedure.  After obtaining informed consent, the colonoscope                            was passed under direct vision. Throughout the                            procedure, the patient's blood pressure, pulse, and                            oxygen saturations were monitored continuously. The                            CF [id number] was introduced through the anus                            and advanced to the the terminal ileum. The                             colonoscopy was performed without difficulty. The                            patient tolerated the procedure well. The quality                            of the bowel preparation was excellent. The                            terminal ileum, ileocecal valve, appendiceal                            orifice, and rectum were photographed. Scope In: 11:01:26 AM Scope Out: 11:13:08 AM Scope Withdrawal Time: 0 hours 6 minutes 53 seconds  Total Procedure Duration: 0 hours 11 minutes 42 seconds  Findings:                 The perianal and digital rectal examinations were                            normal.                           The entire colon appeared normal. No areas of                            mucosal erythema, edema, erosions, or ulceration.                            Biopsies were taken with a cold forceps for                            histology. Estimated blood loss was minimal.                           The retroflexed view of the distal rectum and anal  verge was normal and showed no anal or rectal                            abnormalities.                           The terminal ileum appeared normal. Complications:            No immediate complications. Estimated Blood Loss:     Estimated blood loss was minimal. Impression:               - The entire examined colon is normal. Biopsied.                           - The distal rectum and anal verge are normal on                            retroflexion view.                           - The examined portion of the ileum was normal. Recommendation:           - Patient has a contact number available for                            emergencies. The signs and symptoms of potential                            delayed complications were discussed with the                            patient. Return to normal activities tomorrow.                            Written discharge instructions were provided to  the                            patient.                           - Resume previous diet.                           - Continue present medications.                           - Await pathology results.                           - Repeat colonoscopy at age 89 for screening                            purposes.                           - Use fiber, for example Citrucel, Fibercon, [employer]  or Metamucil.                           - Return to GI clinic PRN. Gerrit Heck, MD 10/14/2021 11:18:28 AM

## 2021-10-16 ENCOUNTER — Telehealth: Payer: Self-pay | Admitting: *Deleted

## 2021-10-16 NOTE — Telephone Encounter (Signed)
1640- spoke with pt and she is pain free at this time and "eating lightly" ?

## 2021-10-16 NOTE — Telephone Encounter (Signed)
?  Follow up Call- ? ?Call back number 10/14/2021  ?Post procedure Call Back phone  # [phone]  ?Permission to leave phone message Yes  ?Some recent data might be hidden  ?  ? ?Patient questions: ? ?Do you have a fever, pain , or abdominal swelling? no ?Pain Score  0 * ? ?Have you tolerated food without any problems? Yes.   ? ?Have you been able to return to your normal activities? Yes.   ? ?Do you have any questions about your discharge instructions: ?Diet   No. ?Medications  No. ?Follow up visit  No. ? ?Do you have questions or concerns about your Care? No. ? ?Actions: ?* If pain score is 4 or above: ?No action needed, pain <4. ? ?Have you developed a fever since your procedure? no ? ?2.   Have you had an respiratory symptoms (SOB or cough) since your procedure? no ? ?3.   Have you tested positive for COVID 19 since your procedure no ? ?4.   Have you had any family members/close contacts diagnosed with the COVID 19 since your procedure?  no ? ? ?If yes to any of these questions please route to Laverna Peace, RN and Karlton Lemon, RN ? ? ?Dr. Barron Alvine, ? ?I spoke with this pt this am.  She was not having pain at the time, but stated "since my procedure I have been having lower left abdominal pain.  When I eat, I have to go the bathroom immediately."  She states these pains are "sharper than before", rating them at a "7" when they occur.  They are off and on.  I know she came in for generalized abdominal pain and diarrhea, which I asked her about- she states, "the pains are just sharper now."  No blood noted and she states abdomen is soft.  I told her biopsies were taken and we are awaiting those results. ?Please advise. ? ?Thanks, ?Emigdio Wildeman ? ? ?

## 2021-10-17 NOTE — Telephone Encounter (Signed)
Thank you for checking in on her.  Glad to hear that she is pain-free and eating lightly at the end of the afternoon yesterday.  Can call us with updates and changes in status prn.  ?

## 2021-10-20 ENCOUNTER — Telehealth: Payer: Self-pay | Admitting: Gastroenterology

## 2021-10-20 ENCOUNTER — Other Ambulatory Visit: Payer: Self-pay

## 2021-10-20 ENCOUNTER — Encounter: Payer: Self-pay | Admitting: Gastroenterology

## 2021-10-20 DIAGNOSIS — R1084 Generalized abdominal pain: Secondary | ICD-10-CM

## 2021-10-20 DIAGNOSIS — R197 Diarrhea, unspecified: Secondary | ICD-10-CM

## 2021-10-20 MED ORDER — DICYCLOMINE HCL 10 MG PO CAPS: 10.0000 mg | ORAL_CAPSULE | Freq: Four times a day (QID) | ORAL | 0 refills | Status: DC | PRN

## 2021-10-20 NOTE — Telephone Encounter (Signed)
Inbound call from patient had procedure 2/28. States she is experiencing abd pain on and off but stayed consistent beginning 3/5 ?

## 2021-10-20 NOTE — Telephone Encounter (Signed)
Order placed for GI pcr panel and CBC. Bentyl sent to pt's preferred pharmacy. Called and spoke with pt and gave pt recommendations. Pt verbalized understanding  ?

## 2021-10-20 NOTE — Telephone Encounter (Signed)
Pt reports that she had sharp lower abd pain in her left to mid abd. Pain began last night at around 10 pm. Pt also reports she had diarrhea and vomiting last night. Asked pt how many times she vomited and had diarrhea and pt was unable to answer question. Pt stated "I was in the bathroom for an hour" asked pt again about how many times did she vomit pt then asked "Am I speaking to the nurse?" Let pt know that she was speaking to the nurse. Pt then stated again that she was in the bathroom for an hour. Asked pt consistency of diarrhea whether it was watery or loose stool. Pt reports it was watery.   ?

## 2021-10-20 NOTE — Telephone Encounter (Signed)
Given relatively acute onset of nausea/vomiting/diarrhea, infectious etiology would be most likely.  Did recently have a normal colonoscopy, to include normal biopsies (no microscopic colitis, IBD, etc.).  Plan for the following: ? ?- GI PCR panel, CBC ?- Bentyl 10 mg prn every 6 hours for abdominal pain/cramping ?- Ensure drinking plenty of fluids ?- Brat diet as tolerated ?

## 2021-12-01 ENCOUNTER — Other Ambulatory Visit (INDEPENDENT_AMBULATORY_CARE_PROVIDER_SITE_OTHER): Payer: No Typology Code available for payment source

## 2021-12-01 DIAGNOSIS — R1084 Generalized abdominal pain: Secondary | ICD-10-CM | POA: Diagnosis not present

## 2021-12-01 DIAGNOSIS — R197 Diarrhea, unspecified: Secondary | ICD-10-CM

## 2021-12-01 LAB — CBC WITH DIFFERENTIAL/PLATELET
Basophils Absolute: 0 10*3/uL (ref 0.0–0.1)
Basophils Relative: 0.6 % (ref 0.0–3.0)
Eosinophils Absolute: 0.1 10*3/uL (ref 0.0–0.7)
Eosinophils Relative: 2.3 % (ref 0.0–5.0)
HCT: 37.3 % (ref 36.0–46.0)
Hemoglobin: 12.6 g/dL (ref 12.0–15.0)
Lymphocytes Relative: 36.8 % (ref 12.0–46.0)
Lymphs Abs: 2.3 10*3/uL (ref 0.7–4.0)
MCHC: 33.7 g/dL (ref 30.0–36.0)
MCV: 89.3 fl (ref 78.0–100.0)
Monocytes Absolute: 0.5 10*3/uL (ref 0.1–1.0)
Monocytes Relative: 7.6 % (ref 3.0–12.0)
Neutro Abs: 3.4 10*3/uL (ref 1.4–7.7)
Neutrophils Relative %: 52.7 % (ref 43.0–77.0)
Platelets: 269 10*3/uL (ref 150.0–400.0)
RBC: 4.18 Mil/uL (ref 3.87–5.11)
RDW: 12.4 % (ref 11.5–15.5)
WBC: 6.4 10*3/uL (ref 4.0–10.5)

## 2022-02-10 ENCOUNTER — Ambulatory Visit (INDEPENDENT_AMBULATORY_CARE_PROVIDER_SITE_OTHER): Payer: No Typology Code available for payment source | Admitting: Physician Assistant

## 2022-02-10 ENCOUNTER — Encounter: Payer: Self-pay | Admitting: Physician Assistant

## 2022-02-10 ENCOUNTER — Other Ambulatory Visit: Payer: No Typology Code available for payment source

## 2022-02-10 ENCOUNTER — Other Ambulatory Visit (HOSPITAL_COMMUNITY)
Admission: RE | Admit: 2022-02-10 | Discharge: 2022-02-10 | Disposition: A | Discharge status: Home / Self Care | Payer: No Typology Code available for payment source | Source: Ambulatory Visit | Attending: Physician Assistant | Admitting: Physician Assistant

## 2022-02-10 VITALS — BP 100/70 | HR 93 | Ht 63.0 in | Wt 159.2 lb

## 2022-02-10 DIAGNOSIS — Z124 Encounter for screening for malignant neoplasm of cervix: Secondary | ICD-10-CM

## 2022-02-10 DIAGNOSIS — K219 Gastro-esophageal reflux disease without esophagitis: Secondary | ICD-10-CM | POA: Diagnosis not present

## 2022-02-10 DIAGNOSIS — Z01419 Encounter for gynecological examination (general) (routine) without abnormal findings: Secondary | ICD-10-CM | POA: Insufficient documentation

## 2022-02-10 DIAGNOSIS — Z0000 Encounter for general adult medical examination without abnormal findings: Secondary | ICD-10-CM | POA: Diagnosis not present

## 2022-02-10 DIAGNOSIS — R1084 Generalized abdominal pain: Secondary | ICD-10-CM

## 2022-02-10 DIAGNOSIS — R300 Dysuria: Secondary | ICD-10-CM | POA: Diagnosis not present

## 2022-02-10 DIAGNOSIS — R197 Diarrhea, unspecified: Secondary | ICD-10-CM

## 2022-02-10 DIAGNOSIS — R9389 Abnormal findings on diagnostic imaging of other specified body structures: Secondary | ICD-10-CM

## 2022-02-10 LAB — POCT URINALYSIS DIP (CLINITEK)
Bilirubin, UA: NEGATIVE
Blood, UA: NEGATIVE
Glucose, UA: NEGATIVE mg/dL
Leukocytes, UA: NEGATIVE
Nitrite, UA: NEGATIVE
POC PROTEIN,UA: NEGATIVE
Spec Grav, UA: 1.025 (ref 1.010–1.025)
Urobilinogen, UA: 0.2 E.U./dL
pH, UA: 6 (ref 5.0–8.0)

## 2022-02-10 NOTE — Progress Notes (Signed)
[place]   Complete physical exam   Patient: Joyce Rice   DOB: [birth year]   36 y.o. Female  MRN: [id number] Visit Date: 02/10/2022  Chief Complaint  Patient presents with   Annual Exam    Fasting CPE- With pap no other concerns.   Subjective    Joyce Rice is a 36 y.o. female who presents today for a complete physical exam.   Reports is generally feeling well; is eating a healthy diet; is sleeping well 6 - 7 hours; drinks 3 - 4 bottles of water a day; is exercising sometimes with walking; reports that she continues to have intermittent abdominal pain, nausea, vomiting, and diarrhea since 08/2021; normal colonoscopy 09/2021; requests a referral back to GI for evaluation; also reports burning with urination for 1 week; denies vaginal discharge; requests to have a urinalysis done.   HPI HPI     Annual Exam    Additional comments: Fasting CPE- With pap no other concerns.      Last edited by Deforest Hoyles, [place] on 02/10/2022  3:24 PM.        Past Medical History:  Diagnosis Date   Abdominal pain 10/08/2021   Bartholin cyst    Chlamydia    Chlamydia 12/29/2011   Azithromycin given 12/29/11, vomited. Partner Tx per pt. Rx Doxy 01/29/12.    Diarrhea 10/08/2021   Headache    Infection    UTI   Irregular periods/menstrual cycles    PONV (postoperative nausea and vomiting)    Past Surgical History:  Procedure Laterality Date   DILATION AND CURETTAGE OF UTERUS     INDUCED ABORTION     LAPAROSCOPIC TUBAL LIGATION Bilateral 05/26/2016   Procedure: LAPAROSCOPIC TUBAL LIGATION;  Surgeon: Mora Bellman, MD;  Location: [facility];  Service: Gynecology;  Laterality: Bilateral;   Social History   Socioeconomic History   Marital status: Single    Spouse name: Not on file   Number of children: Not on file   Years of education: Not on file   Highest education level: Not on file  Occupational History   Not on file  Tobacco Use   Smoking status: Never   Smokeless tobacco: Never   Vaping Use   Vaping Use: Never used  Substance and Sexual Activity   Alcohol use: Yes    Comment: once or twice a year   Drug use: No   Sexual activity: Not Currently    Birth control/protection: Surgical  Other Topics Concern   Not on file  Social History Narrative   Not on file   Social Determinants of Health   Financial Resource Strain: Not on file  Food Insecurity: Not on file  Transportation Needs: Not on file  Physical Activity: Not on file  Stress: Not on file  Social Connections: Not on file  Intimate Partner Violence: Not on file   Family Status  Relation Name Status   Mother  Alive   Father  Alive   Sister  Alive   Sister  Alive   MGM  Alive   MGF  Alive   PGM  Alive   PGF  Alive   Neg Hx  (Not Specified)   Family History  Problem Relation Age of Onset   Hearing loss Neg Hx    Colon cancer Neg Hx    Esophageal cancer Neg Hx    No Known Allergies  Patient Care Team: Marcellina Millin as PCP - General (Physician Assistant)   Medications: Outpatient Medications Prior to  Visit  Medication Sig   pantoprazole (PROTONIX) 20 MG tablet    [DISCONTINUED] albuterol (VENTOLIN HFA) 108 (90 Base) MCG/ACT inhaler Inhale 1-2 puffs into the lungs every 4 (four) hours as needed for wheezing or shortness of breath.   [DISCONTINUED] dicyclomine (BENTYL) 10 MG capsule Take 1 capsule (10 mg total) by mouth every 6 (six) hours as needed for spasms (for abdominal pain/cramping).   [DISCONTINUED] Multiple Vitamin (MULTIVITAMIN WITH MINERALS) TABS tablet Take 1 tablet by mouth daily.   [DISCONTINUED] ondansetron (ZOFRAN) 4 MG tablet Take 1 tablet (4 mg total) by mouth every 6 (six) hours. (Patient not taking: Reported on 10/14/2021)   [DISCONTINUED] Spacer/Aero-Holding Chambers (AEROCHAMBER PLUS) inhaler Use with inhaler (Patient not taking: Reported on 10/14/2021)   No facility-administered medications prior to visit.    Review of Systems  Constitutional:  Negative  for activity change and fever.  HENT:  Negative for congestion, ear pain and voice change.   Eyes:  Negative for redness.  Respiratory:  Negative for cough.   Cardiovascular:  Negative for chest pain.  Gastrointestinal:  Negative for constipation and diarrhea.  Endocrine: Negative for polyuria.  Genitourinary:  Positive for dysuria. Negative for flank pain and vaginal discharge.  Musculoskeletal:  Negative for gait problem and neck stiffness.  Skin:  Negative for color change and rash.  Neurological:  Negative for dizziness.  Hematological:  Negative for adenopathy.  Psychiatric/Behavioral:  Negative for agitation, behavioral problems and confusion.     Last CBC Lab Results  Component Value Date   WBC 6.4 12/01/2021   HGB 12.6 12/01/2021   HCT 37.3 12/01/2021   MCV 89.3 12/01/2021   MCH 30.5 09/08/2021   RDW 12.4 12/01/2021   PLT 269.0 123456   Last metabolic panel Lab Results  Component Value Date   GLUCOSE 103 (H) 09/08/2021   NA 139 09/08/2021   K 3.7 09/08/2021   CL 107 09/08/2021   CO2 26 09/08/2021   BUN 13 09/08/2021   CREATININE 0.61 09/08/2021   GFRNONAA >60 09/08/2021   CALCIUM 9.1 09/08/2021   PROT 7.8 09/08/2021   ALBUMIN 4.1 09/08/2021   BILITOT 0.3 09/08/2021   ALKPHOS 55 09/08/2021   AST 25 09/08/2021   ALT 27 09/08/2021   ANIONGAP 6 09/08/2021   Last lipids Lab Results  Component Value Date   CHOL 153 02/10/2022   HDL 53 02/10/2022   LDLCALC 81 02/10/2022   TRIG 106 02/10/2022   CHOLHDL 2.9 02/10/2022      The ASCVD Risk score (Arnett DK, et al., 2019) failed to calculate for the following reasons:   The 2019 ASCVD risk score is only valid for ages 60 to 70   Objective    BP 100/70   Pulse 93   Ht 5\' 3"  (1.6 m)   Wt 159 lb 3.2 oz (72.2 kg)   LMP 01/27/2022   SpO2 97%   BMI 28.20 kg/m   BP Readings from Last 3 Encounters:  02/10/22 100/70  10/14/21 101/68  10/08/21 112/66   Wt Readings from Last 3 Encounters:  02/10/22  159 lb 3.2 oz (72.2 kg)  10/14/21 168 lb (76.2 kg)  10/08/21 168 lb 12.8 oz (76.6 kg)       Physical Exam Vitals and nursing note reviewed.  Constitutional:      General: She is not in acute distress.    Appearance: Normal appearance. She is not ill-appearing.  HENT:     Head: Normocephalic and atraumatic.  Right Ear: Tympanic membrane, ear canal and external ear normal.     Left Ear: Tympanic membrane, ear canal and external ear normal.     Nose: No congestion.  Eyes:     Extraocular Movements: Extraocular movements intact.     Conjunctiva/sclera: Conjunctivae normal.     Pupils: Pupils are equal, round, and reactive to light.  Neck:     Vascular: No carotid bruit.  Cardiovascular:     Rate and Rhythm: Normal rate and regular rhythm.     Pulses: Normal pulses.     Heart sounds: Normal heart sounds.  Pulmonary:     Effort: Pulmonary effort is normal.     Breath sounds: Normal breath sounds. No wheezing.  Abdominal:     General: Bowel sounds are normal.     Palpations: Abdomen is soft.  Genitourinary:    Comments: Pelvic Exam: External Genitalia:  No lesions, BUS negative for discharge Vagina:  Pink, good support, normal discharge Cervix: No lesions present, negative CMT Uterus:  NSSC mobile and non tender Adnexa:  No masses or tenderness bilaterally + Musculoskeletal:        General: Normal range of motion.     Cervical back: Normal range of motion and neck supple.     Right lower leg: No edema.     Left lower leg: No edema.  Skin:    General: Skin is warm and dry.     Findings: No bruising.  Neurological:     General: No focal deficit present.     Mental Status: She is alert and oriented to person, place, and time.  Psychiatric:        Mood and Affect: Mood normal.        Behavior: Behavior normal.        Thought Content: Thought content normal.       Last depression screening scores    02/10/2022    3:26 PM 01/24/2021   11:11 AM 06/08/2017   10:23 AM   PHQ 2/9 Scores  PHQ - 2 Score 0 0 0  PHQ- 9 Score   0   Last fall risk screening    02/10/2022    3:26 PM  Fall Risk   Falls in the past year? 1  Number falls in past yr: 0  Injury with Fall? 1  Comment Pt broke her ankle  Risk for fall due to : No Fall Risks  Follow up Falls evaluation completed     Results for orders placed or performed in visit on 02/10/22  Lipid panel  Result Value Ref Range   Cholesterol, Total 153 100 - 199 mg/dL   Triglycerides 073 0 - 149 mg/dL   HDL 53 >71 mg/dL   VLDL Cholesterol Cal 19 5 - 40 mg/dL   LDL Chol Calc (NIH) 81 0 - 99 mg/dL   Chol/HDL Ratio 2.9 0.0 - 4.4 ratio  POCT URINALYSIS DIP (CLINITEK)  Result Value Ref Range   Color, UA yellow yellow   Clarity, UA clear clear   Glucose, UA negative negative mg/dL   Bilirubin, UA negative negative   Ketones, POC UA trace (5) (A) negative mg/dL   Spec Grav, UA 0.626 9.485 - 1.025   Blood, UA negative negative   pH, UA 6.0 5.0 - 8.0   POC PROTEIN,UA negative negative, trace   Urobilinogen, UA 0.2 0.2 or 1.0 E.U./dL   Nitrite, UA Negative Negative   Leukocytes, UA Negative Negative    Assessment & Plan  Routine Health Maintenance and Physical Exam  Exercise Activities and Dietary recommendations  Goals   None     There is no immunization history for the selected administration types on file for this patient.  Health Maintenance  Topic Date Due   PAP SMEAR-Modifier  06/08/2020   COVID-19 Vaccine (1) 06/22/2022 (Originally 06/10/1986)   TETANUS/TDAP  06/29/2022 (Originally 12/10/2006)   INFLUENZA VACCINE  03/17/2022   Hepatitis C Screening  Completed   HIV Screening  Completed   HPV VACCINES  Aged Out    Discussed health benefits of physical activity, and encouraged her to engage in regular exercise appropriate for her age and condition.  Problem List Items Addressed This Visit       Digestive   GERD without esophagitis    stable, avoid stomach irritants (tomatoes,  oranges, lemons, limes, spicy/greasy food)       Relevant Medications   pantoprazole (PROTONIX) 20 MG tablet   Other Relevant Orders   Ambulatory referral to Gastroenterology     Other   Abnormal CT scan    Will refer back to GI for evaluation of intermittent nausea, vomiting, diarrhea      Relevant Orders   Ambulatory referral to Gastroenterology   Other Visit Diagnoses     Routine medical exam    -  Primary   Relevant Orders   Lipid panel (Completed)   Women's annual routine gynecological examination       Relevant Orders   Cytology - PAP - pap smear done today   Cervical cancer screening       Relevant Orders   Cytology - PAP   Dysuria       Relevant Orders   POCT URINALYSIS DIP (CLINITEK) (Completed)  - urinalysis normal, Drink about 64 ounces of water a day, avoid or limit caffeine, take OTC cranberry supplements which support bladder health, eat a low sugar diet, blueberries are low in sugar and also support bladder health, after urinating always wipe front to back, urinate after sex, limit sitting in a tub to take baths       Return in about 1 year (around 02/11/2023) for Return for Annual Exam.     Jake Shark, PA-C

## 2022-02-11 LAB — LIPID PANEL
Chol/HDL Ratio: 2.9 ratio (ref 0.0–4.4)
Cholesterol, Total: 153 mg/dL (ref 100–199)
HDL: 53 mg/dL (ref >39)
LDL Chol Calc (NIH): 81 mg/dL (ref 0–99)
Triglycerides: 106 mg/dL (ref 0–149)
VLDL Cholesterol Cal: 19 mg/dL (ref 5–40)

## 2022-02-11 NOTE — Assessment & Plan Note (Signed)
stable,  avoid stomach irritants (tomatoes, oranges, lemons, limes, spicy/greasy food)  

## 2022-02-11 NOTE — Assessment & Plan Note (Signed)
Will refer back to GI for evaluation of intermittent nausea, vomiting, diarrhea

## 2022-02-12 LAB — GI PROFILE, STOOL, PCR

## 2022-02-12 LAB — CYTOLOGY - PAP: Diagnosis: NEGATIVE

## 2022-03-02 ENCOUNTER — Telehealth: Payer: Self-pay

## 2022-03-02 NOTE — Telephone Encounter (Signed)
I can see her

## 2022-03-02 NOTE — Telephone Encounter (Signed)
Pt called and said she was having heavy vaginal bleeding this past Saturday. She is still bleeding and wants to see if she can have a pelvic exam from a female physician to see what is going on. She had a Cpe and Pap on 02/10/22 with Orlie Pollen. She was advised that she may need to be referred to a GYN.

## 2022-03-04 ENCOUNTER — Ambulatory Visit (INDEPENDENT_AMBULATORY_CARE_PROVIDER_SITE_OTHER): Payer: No Typology Code available for payment source | Admitting: Family Medicine

## 2022-03-04 ENCOUNTER — Encounter: Payer: Self-pay | Admitting: Family Medicine

## 2022-03-04 VITALS — BP 120/74 | HR 68 | Ht 63.0 in | Wt 163.6 lb

## 2022-03-04 DIAGNOSIS — Z113 Encounter for screening for infections with a predominantly sexual mode of transmission: Secondary | ICD-10-CM

## 2022-03-04 DIAGNOSIS — N939 Abnormal uterine and vaginal bleeding, unspecified: Secondary | ICD-10-CM

## 2022-03-04 NOTE — Patient Instructions (Signed)
We are doing full STD testing today.  The swab will take 3-5 days to come back. The blood results should be back in 1-2 days.  At your convenience, double check that you had hepatitis B surface antibody test (not antigen)--this is the titers showing you are immune.  If you haven't had this done, with your next check you can (and if not immune, can get a different type of hepatitis B vaccine).

## 2022-03-04 NOTE — Progress Notes (Signed)
Chief Complaint  Patient presents with   Menstrual Problem    After intercourse woke up and was bleeding. Still bleeding but is less than it was. She is not sure if this is a period or not. Also asking for STD check.    Sat 7/15 --she had intercourse that evening.  She wasn't aware of any bleeding when she went to the bathroom afterwards.  The next morning there was blood all in the underwear, on the bed and dripping down her leg. Bleeding was very heavy.  Unaware if she saw clots.  She has never seen bleeding that heavy. Lasted like that for 2-3 days, could barely get out of bed.  She felt very weak, dizzy, little energy.  Felt like she could pass out, but never fainted. She was having pain in stomach and back, sharp pains, felt different from cramping. Did not feel like her usual periods. LMP was 6/13. Periods are usually somewhat heavy the first 2 days, lasting 6-7 days, with some typical cramps (not severe). She is s/p BTL  Currently she feels fine.  Denies any dizziness.  Currently has very light bleeding. No abnormal discharge.  This abdominal pain was a different pain than what she has been seeing GI for. CT 08/2021 had ruptured cyst, inflammatory of infectious colitis. Has f/u with GI next month.  Monogamous relationship x 15 yrs Last STD check negative in 08/2021 H/o chlamydia 11/2020 (same partner) Always has slight discharge--denies change. No odor, itch.  She had recent CPE, was surprised that STD testing was done/offered. She would like these done today. Last HIV/RPR, Hep B and C were 05/2017  PMH, PSH, SH reviewed  Outpatient Encounter Medications as of 03/04/2022  Medication Sig   [DISCONTINUED] pantoprazole (PROTONIX) 20 MG tablet    No facility-administered encounter medications on file as of 03/04/2022.   No Known Allergies  ROS: no fever, chills, URI symptoms.  +abdominal pain/diarrhea issues for which she has seen GI, has had some discomfort and has f/u  scheduled. Abnormal vaginal bleeding after intercourse this past weekend, now improving. No dysuria, hematuria.  No abnormal vaginal discharge. No other bleeding/bruising. Weakness/fatigue has resolved (severe when she was bleeding).   PHYSICAL EXAM:  BP 120/74   Pulse 68   Ht 5\' 3"  (1.6 m)   Wt 163 lb 9.6 oz (74.2 kg)   LMP 01/27/2022   BMI 28.98 kg/m   Pleasant, somewhat anxious-appearing female, in no distress HEENT: conjunctiva and sclera are clear, EOMI Abdomen: soft.  Mildly tender below umbilicus, mild/diffuse, no focal tenderness or mass. No rebound or guarding. External genitalia:  difficult to view at first--pt guarding, not relaxing knees apart, squeezing together and lifting bottom of the table--she was anxious and feared exam being painful.  Eventually got her to be able to relax and she tolerated the speculum and pelvic exam well. No external lesions.  No abnormal vaginal discharge. No blood in the vault.  Cervix had 2 nabothian cysts present.  No abnormal discharge--small amount of stringy clear-white discharge. No cervical motion tenderness. No vaginal lesions noted. No evidence of any recent trauma or tear. Mildly tender at superior aspect of uterus/upper abdomen. No adnexal masses or tenderness. Neuro: alert and oriented. Cranial nerves grossly intact, normal cranial nerves, gait, strength.  ASSESSMENT/PLAN:  Abnormal vaginal bleeding - Ddx reviewed--postcoital, poss trauma/tear that has already healed. Timing is appropr for cycle. May need 01/29/2022 or GYN consult if recurrent problems - Plan: Beta hCG quant (ref lab), CBC with  Differential/Platelet, TSH  Screen for STD (sexually transmitted disease) - Pt had chlamydia last year (with same partner), had neg TOC but no other STD testing. Normal exam - Plan: RPR, HIV Antibody (routine testing w rflx), NuSwab Vaginitis Plus (VG+), Hepatitis C antibody, Hepatitis B surface antigen   Pt very anxious, many questions. Chart  reviewed during visit in order to help answer her questions. Ddx reviewed.   I spent 40 minutes dedicated to the care of this patient, including pre-visit review of records, face to face time, post-visit ordering of testing and documentation.

## 2022-03-05 LAB — CBC WITH DIFFERENTIAL/PLATELET
Basophils Absolute: 0 10*3/uL (ref 0.0–0.2)
Basos: 0 %
EOS (ABSOLUTE): 0.1 10*3/uL (ref 0.0–0.4)
Eos: 2 %
Hematocrit: 36.1 % (ref 34.0–46.6)
Hemoglobin: 12.2 g/dL (ref 11.1–15.9)
Immature Grans (Abs): 0 10*3/uL (ref 0.0–0.1)
Immature Granulocytes: 0 %
Lymphocytes Absolute: 2.6 10*3/uL (ref 0.7–3.1)
Lymphs: 41 %
MCH: 28 pg (ref 26.6–33.0)
MCHC: 33.8 g/dL (ref 31.5–35.7)
MCV: 83 fL (ref 79–97)
Monocytes Absolute: 0.7 10*3/uL (ref 0.1–0.9)
Monocytes: 11 %
Neutrophils Absolute: 2.9 10*3/uL (ref 1.4–7.0)
Neutrophils: 46 %
Platelets: 281 10*3/uL (ref 150–450)
RBC: 4.36 x10E6/uL (ref 3.77–5.28)
RDW: 11.2 % — ABNORMAL LOW (ref 11.7–15.4)
WBC: 6.4 10*3/uL (ref 3.4–10.8)

## 2022-03-05 LAB — TSH: TSH: <0.005 u[IU]/mL — ABNORMAL LOW (ref 0.450–4.500)

## 2022-03-05 LAB — HEPATITIS C ANTIBODY: Hep C Virus Ab: NONREACTIVE

## 2022-03-05 LAB — HEPATITIS B SURFACE ANTIGEN: Hepatitis B Surface Ag: NEGATIVE

## 2022-03-05 LAB — RPR: RPR Ser Ql: NONREACTIVE

## 2022-03-05 LAB — BETA HCG QUANT (REF LAB): hCG Quant: <1 m[IU]/mL

## 2022-03-05 LAB — HIV ANTIBODY (ROUTINE TESTING W REFLEX): HIV Screen 4th Generation wRfx: NONREACTIVE

## 2022-03-06 LAB — T3, FREE: T3, Free: 10.6 pg/mL — ABNORMAL HIGH (ref 2.0–4.4)

## 2022-03-06 LAB — SPECIMEN STATUS REPORT

## 2022-03-06 LAB — T4, FREE: Free T4: 3.49 ng/dL — ABNORMAL HIGH (ref 0.82–1.77)

## 2022-03-11 LAB — NUSWAB VAGINITIS PLUS (VG+)
Candida albicans, NAA: NEGATIVE
Candida glabrata, NAA: POSITIVE — AB
Chlamydia trachomatis, NAA: NEGATIVE
Neisseria gonorrhoeae, NAA: NEGATIVE
Trich vag by NAA: NEGATIVE

## 2022-03-11 NOTE — Progress Notes (Unsigned)
  No chief complaint on file.   Patient was seen 7/19 with abnormal vaginal bleeding. She had also requested STD testing. STD tests were all negative. GC, chlamydia, trichomonas and BV testing via NuSwab were negative.  Yeast results just came back + yesterday.  Vaginal discharge or itching??  TSH was checked due to abnormal bleeding.  TSH was found to be very low, and free T4 and total T3 levels were high.  We were unable to add on TSH antibodies. She hadn't been describing symptoms of overactive thyroid, wasn't tachycardic, had gained rather than lost weight. She was asked to return today for additional f/u on thyroid.   PHYSICAL EXAM  LMP 01/27/2022   Wt Readings from Last 3 Encounters:  03/04/22 163 lb 9.6 oz (74.2 kg)  02/10/22 159 lb 3.2 oz (72.2 kg)  10/14/21 168 lb (76.2 kg)    CHECK THYROID--tender???    ASSESSMENT/PLAN:   Treat yeast? Thyroid stimulating ab's, and repeat TFT's

## 2022-03-12 ENCOUNTER — Ambulatory Visit (INDEPENDENT_AMBULATORY_CARE_PROVIDER_SITE_OTHER): Payer: No Typology Code available for payment source | Admitting: Family Medicine

## 2022-03-12 ENCOUNTER — Encounter: Payer: Self-pay | Admitting: Family Medicine

## 2022-03-12 VITALS — BP 120/72 | HR 80 | Ht 63.0 in | Wt 160.2 lb

## 2022-03-12 DIAGNOSIS — E0590 Thyrotoxicosis, unspecified without thyrotoxic crisis or storm: Secondary | ICD-10-CM | POA: Diagnosis not present

## 2022-03-12 DIAGNOSIS — B3731 Acute candidiasis of vulva and vagina: Secondary | ICD-10-CM

## 2022-03-12 MED ORDER — FLUCONAZOLE 150 MG PO TABS: 150.0000 mg | ORAL_TABLET | Freq: Once | ORAL | 0 refills | Status: AC

## 2022-03-13 LAB — T4, FREE: Free T4: 3.04 ng/dL — ABNORMAL HIGH (ref 0.82–1.77)

## 2022-03-13 LAB — TSH: TSH: <0.005 u[IU]/mL — ABNORMAL LOW (ref 0.450–4.500)

## 2022-03-13 LAB — THYROID STIMULATING IMMUNOGLOBULIN: Thyroid Stim Immunoglobulin: <0.1 IU/L (ref 0.00–0.55)

## 2022-03-15 ENCOUNTER — Other Ambulatory Visit: Payer: Self-pay | Admitting: Family Medicine

## 2022-03-15 DIAGNOSIS — E0590 Thyrotoxicosis, unspecified without thyrotoxic crisis or storm: Secondary | ICD-10-CM

## 2022-03-20 ENCOUNTER — Other Ambulatory Visit: Payer: No Typology Code available for payment source

## 2022-03-20 ENCOUNTER — Ambulatory Visit (INDEPENDENT_AMBULATORY_CARE_PROVIDER_SITE_OTHER): Payer: No Typology Code available for payment source | Admitting: Gastroenterology

## 2022-03-20 ENCOUNTER — Encounter: Payer: Self-pay | Admitting: Gastroenterology

## 2022-03-20 VITALS — BP 122/82 | HR 82 | Ht 63.0 in | Wt 185.0 lb

## 2022-03-20 DIAGNOSIS — R112 Nausea with vomiting, unspecified: Secondary | ICD-10-CM | POA: Diagnosis not present

## 2022-03-20 DIAGNOSIS — R197 Diarrhea, unspecified: Secondary | ICD-10-CM

## 2022-03-20 DIAGNOSIS — R1084 Generalized abdominal pain: Secondary | ICD-10-CM

## 2022-03-20 DIAGNOSIS — R194 Change in bowel habit: Secondary | ICD-10-CM

## 2022-03-20 DIAGNOSIS — R7989 Other specified abnormal findings of blood chemistry: Secondary | ICD-10-CM

## 2022-03-20 MED ORDER — DICYCLOMINE HCL 10 MG PO CAPS: 10.0000 mg | ORAL_CAPSULE | Freq: Three times a day (TID) | ORAL | 3 refills | Status: DC

## 2022-03-20 MED ORDER — DICYCLOMINE HCL 10 MG PO CAPS: 10.0000 mg | ORAL_CAPSULE | Freq: Four times a day (QID) | ORAL | 3 refills | Status: DC | PRN

## 2022-03-20 NOTE — Progress Notes (Signed)
Chief Complaint:    Abdominal pain, nausea, diarrhea  GI History: 36 year old female initially seen in the GI clinic on 10/08/2021 for evaluation of nausea/vomiting, generalized abdominal pain, diarrhea.  Symptoms started in the fall 2022. - 09/08/2021: ER evaluation for GI symptoms.  Normal CBC, CMP, lipase.  CT A/P with wall thickening and ascending/transverse colon and possible recent rupture of right ovarian follicle, fatty liver infiltration.  Treated with Augmentin - 10/08/2021: Initial GI appointment.  Symptoms improving but not completely resolved after course of Augmentin.  Still with abdominal pain, diarrhea and nausea, but emesis resolved. - 10/14/2021: Colonoscopy: Normal colon, biopsies negative for MC.  Normal TI.  Repeat age 20 for routine CRC screening - 02/10/2022: Negative/normal GI PCR panel - 03/04/2022: Normal CBC.  TSH <0.005, elevated T4 and T3 - 03/12/2022 TSH <0.005, elevated T4  HPI:     Patient is a 36 y.o. female presenting to the Gastroenterology Clinic for follow-up.   Recently diagnosed with hyperactive thyroid and undergoing work-up with PCM for undetectable TSH and elevated T3/T4.  Scheduled for thyroid ultrasound.  Still with nausea and generalized abdominal pain. Intermittent loose, non-bloody stools. Has 4 times/month, with 3-4 stools on those days, with symptoms typically lasting <24 hours.  Baseline is 1 formed stool/day. Tends to be night or early AM. Does not recall ever trialing Bentyl. Not related to food types or timing of PO intake.   No new recent abdominal imaging for review.      Latest Ref Rng & Units 03/04/2022    4:27 PM 12/01/2021    3:05 PM 09/08/2021    8:55 AM  CBC  WBC 3.4 - 10.8 x10E3/uL 6.4  6.4  6.4   Hemoglobin 11.1 - 15.9 g/dL 76.7  20.9  47.0   Hematocrit 34.0 - 46.6 % 36.1  37.3  39.1   Platelets 150 - 450 x10E3/uL 281  269.0  278      Review of systems:     No chest pain, no SOB, no fevers, no urinary sx   Past Medical  History:  Diagnosis Date   Abdominal pain 10/08/2021   Bartholin cyst    Chlamydia    Chlamydia 12/29/2011   Azithromycin given 12/29/11, vomited. Partner Tx per pt. Rx Doxy 01/29/12.    Diarrhea 10/08/2021   Headache    Infection    UTI   Irregular periods/menstrual cycles    PONV (postoperative nausea and vomiting)     Patient's surgical history, family medical history, social history, medications and allergies were all reviewed in Epic    No current outpatient medications on file.   No current facility-administered medications for this visit.    Physical Exam:     BP 122/82   Pulse 82   Ht 5\' 3"  (1.6 m)   Wt 185 lb (83.9 kg)   BMI 32.77 kg/m   GENERAL:  Pleasant female in NAD PSYCH: : Cooperative, normal affect EENT:  conjunctiva pink, mucous membranes moist, neck supple without masses CARDIAC:  RRR, no murmur heard, no peripheral edema PULM: Normal respiratory effort, lungs CTA bilaterally, no wheezing ABDOMEN:  Nondistended, soft, nontender. No obvious masses, no hepatomegaly,  normal bowel sounds SKIN:  turgor, no lesions seen Musculoskeletal:  Normal muscle tone, normal strength NEURO: Alert and oriented x 3, no focal neurologic deficits   IMPRESSION and PLAN:    1) Change in bowel habits/Loose stools 2) Generalized abdominal pain 3) Nausea  IBS type symptoms but in the setting of active  thyroid dysfunction.  Discussed overlap and will monitor for improvement in GI symptoms as she starts treatment for hyperactive thyroid.  In the meantime plan as follows:  - Check celiac panel - Start low FODMAP diet.  Provided with handout and detailed instruction today - Trial course of Bentyl prn - Given ongoing symptomatology and h/o colitis on prior CT, patient requesting repeat CT to evaluate further - If continued symptoms and unrevealing workup above, consider upper endoscopy  4) Low TSH - Follow closely with PCM  I spent 30 minutes of time, including in depth  chart review, independent review of results as outlined above, communicating results with the patient directly, face-to-face time with the patient, coordinating care, and ordering studies and medications as appropriate, and documentation.           Shellia Cleverly ,DO, FACG 03/20/2022, 3:49 PM

## 2022-03-20 NOTE — Patient Instructions (Addendum)
_______________________________________________________  If you are age 34 or older, your body mass index should be between 23-30. Your Body mass index is 32.77 kg/m. If this is out of the aforementioned range listed, please consider follow up with your Primary Care Provider.  If you are age 15 or younger, your body mass index should be between 19-25. Your Body mass index is 32.77 kg/m. If this is out of the aformentioned range listed, please consider follow up with your Primary Care Provider.   We have sent the following medications to your pharmacy for you to pick up at your convenience: Bentyl 10 mg every 6 hours as needed.  You will be contacted by [facility] in the next 2 days to arrange a CT abdomen and Pelvis.  The number on your caller ID will be [phone], please answer when they call.  If you have not heard from them in 2 days please call [phone] to schedule.    Your provider has requested that you go to the basement level for lab work before leaving today. Press "B" on the elevator. The lab is located at the first door on the left as you exit the elevator.  You have been scheduled for a CT scan of the abdomen and pelvis at [facility], 1st floor Radiology. You are scheduled on           at                . You should arrive 15 minutes prior to your appointment time for registration.  We are giving you 2 bottles of contrast today that you will need to drink before arriving for the exam. The solution may taste better if refrigerated so put them in the refrigerator when you get home, but do NOT add ice or any other liquid to this solution as that would dilute it. Shake well before drinking.   Please follow the written instructions below on the day of your exam:   1) Do not eat anything after      (4 hours prior to your test)   2) Drink 1 bottle of contrast @        (2 hours prior to your exam)  Remember to shake well before drinking and do NOT pour  over ice.     Drink 1 bottle of contrast @         (1 hour prior to your exam)   You may take any medications as prescribed with a small amount of water, if necessary. If you take any of the following medications: METFORMIN, GLUCOPHAGE, GLUCOVANCE, AVANDAMET, RIOMET, FORTAMET, [place] MET, JANUMET, GLUMETZA or METAGLIP, you MAY be asked to HOLD this medication 48 hours AFTER the exam.   The purpose of you drinking the oral contrast is to aid in the visualization of your intestinal tract. The contrast solution may cause some diarrhea. Depending on your individual set of symptoms, you may also receive an intravenous injection of x-ray contrast/dye. Plan on being at [facility] for 45 minutes or longer, depending on the type of exam you are having performed.   If you have any questions regarding your exam or if you need to reschedule, you may call [facility] at [phone] between the hours of 8:00 am and 5:00 pm, Monday-Friday.     The [facility] providers would like to encourage you to use [facility] to communicate with providers for non-urgent requests or questions.  Due to long hold times  on the telephone, sending your provider a message by [facility] may be a faster and more efficient way to get a response.  Please allow 48 business hours for a response.  Please remember that this is for non-urgent requests.   Due to recent changes in healthcare laws, you may see the results of your imaging and laboratory studies on MyChart before your provider has had a chance to review them.  We understand that in some cases there may be results that are confusing or concerning to you. Not all laboratory results come back in the same time frame and the provider may be waiting for multiple results in order to interpret others.  Please give [place] 48 hours in order for your provider to thoroughly review all the results before contacting the office for clarification of your results.   It was a pleasure to see you  today!  Thank you for trusting me with your gastrointestinal care!

## 2022-03-21 LAB — IGA: Immunoglobulin A: 204 mg/dL (ref 47–310)

## 2022-03-21 LAB — TISSUE TRANSGLUTAMINASE, IGA: (tTG) Ab, IgA: <1 U/mL

## 2022-03-28 ENCOUNTER — Ambulatory Visit (HOSPITAL_COMMUNITY)
Admission: RE | Admit: 2022-03-28 | Discharge: 2022-03-28 | Disposition: A | Discharge status: Home / Self Care | Payer: No Typology Code available for payment source | Source: Ambulatory Visit | Attending: Gastroenterology | Admitting: Gastroenterology

## 2022-03-28 DIAGNOSIS — R1084 Generalized abdominal pain: Secondary | ICD-10-CM | POA: Insufficient documentation

## 2022-03-28 DIAGNOSIS — R112 Nausea with vomiting, unspecified: Secondary | ICD-10-CM | POA: Diagnosis present

## 2022-03-28 DIAGNOSIS — R197 Diarrhea, unspecified: Secondary | ICD-10-CM | POA: Diagnosis not present

## 2022-03-28 MED ORDER — IOHEXOL 300 MG/ML  SOLN
100.0000 mL | Freq: Once | INTRAMUSCULAR | Status: AC | PRN
  Administered 2022-03-28: 100 mL via INTRAVENOUS

## 2022-03-31 ENCOUNTER — Ambulatory Visit
Admission: RE | Admit: 2022-03-31 | Discharge: 2022-03-31 | Disposition: A | Discharge status: Home / Self Care | Payer: No Typology Code available for payment source | Source: Ambulatory Visit | Attending: Family Medicine | Admitting: Family Medicine

## 2022-03-31 DIAGNOSIS — E0590 Thyrotoxicosis, unspecified without thyrotoxic crisis or storm: Secondary | ICD-10-CM

## 2022-04-01 ENCOUNTER — Other Ambulatory Visit: Payer: Self-pay | Admitting: Family Medicine

## 2022-04-01 DIAGNOSIS — E0590 Thyrotoxicosis, unspecified without thyrotoxic crisis or storm: Secondary | ICD-10-CM

## 2022-04-04 ENCOUNTER — Other Ambulatory Visit (HOSPITAL_COMMUNITY): Payer: No Typology Code available for payment source

## 2022-04-09 ENCOUNTER — Other Ambulatory Visit (INDEPENDENT_AMBULATORY_CARE_PROVIDER_SITE_OTHER): Payer: No Typology Code available for payment source

## 2022-04-09 DIAGNOSIS — E0590 Thyrotoxicosis, unspecified without thyrotoxic crisis or storm: Secondary | ICD-10-CM

## 2022-04-10 ENCOUNTER — Other Ambulatory Visit: Payer: Self-pay

## 2022-04-10 DIAGNOSIS — E0590 Thyrotoxicosis, unspecified without thyrotoxic crisis or storm: Secondary | ICD-10-CM

## 2022-04-10 LAB — THYROID PANEL WITH TSH
Free Thyroxine Index: 2.1 (ref 1.2–4.9)
T3 Uptake Ratio: 25 % (ref 24–39)
T4, Total: 8.3 ug/dL (ref 4.5–12.0)
TSH: <0.005 u[IU]/mL — ABNORMAL LOW (ref 0.450–4.500)

## 2022-04-13 ENCOUNTER — Other Ambulatory Visit: Payer: No Typology Code available for payment source

## 2022-04-15 ENCOUNTER — Other Ambulatory Visit: Payer: Self-pay | Admitting: *Deleted

## 2022-04-15 ENCOUNTER — Telehealth: Payer: Self-pay | Admitting: Physician Assistant

## 2022-04-15 DIAGNOSIS — E0590 Thyrotoxicosis, unspecified without thyrotoxic crisis or storm: Secondary | ICD-10-CM

## 2022-04-15 NOTE — Telephone Encounter (Signed)
Pt was told to call us back if she had not heard about endo appt yet I could not see exactly where she was referred Please advise patient

## 2022-04-15 NOTE — Telephone Encounter (Signed)
Called patient and put referral in.

## 2022-04-22 ENCOUNTER — Encounter: Payer: Self-pay | Admitting: Internal Medicine

## 2022-05-08 ENCOUNTER — Telehealth: Payer: Self-pay | Admitting: Physician Assistant

## 2022-05-08 NOTE — Telephone Encounter (Signed)
Pt called and states that she has a sore throat and thinks her thyroid is swollen. She was offered several appointments and she states she can not come in right now. She requested a note be sent back for recommendations. Pt uses [facility] on [address] and can be reached at [phone].

## 2022-05-08 NOTE — Telephone Encounter (Signed)
Pt advised.

## 2022-05-26 ENCOUNTER — Encounter: Payer: Self-pay | Admitting: Internal Medicine

## 2022-06-03 ENCOUNTER — Ambulatory Visit (INDEPENDENT_AMBULATORY_CARE_PROVIDER_SITE_OTHER): Payer: No Typology Code available for payment source | Admitting: "Endocrinology

## 2022-06-03 ENCOUNTER — Encounter: Payer: Self-pay | Admitting: "Endocrinology

## 2022-06-03 VITALS — BP 114/78 | HR 72 | Ht 63.0 in | Wt 165.2 lb

## 2022-06-03 DIAGNOSIS — E0590 Thyrotoxicosis, unspecified without thyrotoxic crisis or storm: Secondary | ICD-10-CM | POA: Insufficient documentation

## 2022-06-03 NOTE — Progress Notes (Signed)
06/03/2022     Endocrinology Consult Note    Subjective:    Patient ID: Joyce Rice, female    DOB: [birth year], PCP Irene Pap, PA-C.   Past Medical History:  Diagnosis Date   Abdominal pain 10/08/2021   Bartholin cyst    Chlamydia    Chlamydia 12/29/2011   Azithromycin given 12/29/11, vomited. Partner Tx per pt. Rx Doxy 01/29/12.    Diarrhea 10/08/2021   Headache    Infection    UTI   Irregular periods/menstrual cycles    PONV (postoperative nausea and vomiting)     Past Surgical History:  Procedure Laterality Date   COLONOSCOPY  10/14/2021   DILATION AND CURETTAGE OF UTERUS     INDUCED ABORTION     LAPAROSCOPIC TUBAL LIGATION Bilateral 05/26/2016   Procedure: LAPAROSCOPIC TUBAL LIGATION;  Surgeon: Mora Bellman, MD;  Location: [facility];  Service: Gynecology;  Laterality: Bilateral;    Social History   Socioeconomic History   Marital status: Single    Spouse name: Not on file   Number of children: Not on file   Years of education: Not on file   Highest education level: Not on file  Occupational History   Not on file  Tobacco Use   Smoking status: Never   Smokeless tobacco: Never  Vaping Use   Vaping Use: Never used  Substance and Sexual Activity   Alcohol use: Yes    Comment: once or twice a year   Drug use: No   Sexual activity: Not Currently    Birth control/protection: Surgical  Other Topics Concern   Not on file  Social History Narrative   Not on file   Social Determinants of Health   Financial Resource Strain: Not on file  Food Insecurity: Not on file  Transportation Needs: Not on file  Physical Activity: Not on file  Stress: Not on file  Social Connections: Not on file    Family History  Problem Relation Age of Onset   Hearing loss Neg Hx    Colon cancer Neg Hx    Esophageal cancer Neg Hx     Outpatient Encounter Medications as of 06/03/2022  Medication Sig   dicyclomine (BENTYL) 10 MG capsule Take 1 capsule (10 mg  total) by mouth every 6 (six) hours as needed for spasms. (Patient not taking: Reported on 06/03/2022)   No facility-administered encounter medications on file as of 06/03/2022.    ALLERGIES: No Known Allergies  VACCINATION STATUS: There is no immunization history for the selected administration types on file for this patient.   HPI  Joyce Rice is 36 y.o. female who presents today with a medical history as above. she is being seen in consultation for hyperthyroidism requested by Marcellina Millin.  she has been dealing with symptoms of weight loss of 20 pounds since August 2023 , mild tremors, and mild sleep disturbance. These symptoms are progressively worsening and troubling to her. her most recent thyroid labs revealed suppressed TSH on 3 occasions and elevated thyroid levels on 2 occasions.    she denies dysphagia, choking, shortness of breath, no recent voice change.    she denies family history of thyroid dysfunction , nor thyroid malignancy.    she denies personal history of goiter. she is not on any anti-thyroid medications nor on any thyroid hormone supplements. she  is willing to proceed with appropriate work up and therapy for thyrotoxicosis.  Review of systems  Constitutional: + weight loss, + fatigue, + subjective hyperthermia Eyes: no blurry vision, - xerophthalmia ENT: no sore throat, no nodules palpated in throat, no dysphagia/odynophagia, nor hoarseness Cardiovascular: no Chest Pain, no Shortness of Breath, +  palpitations, no leg swelling Respiratory: no cough, no SOB Gastrointestinal: no Nausea, no Vomiting, no Diarhhea Musculoskeletal: no muscle/joint aches Skin: no rashes Neurological: +  tremors, no numbness, no tingling, no dizziness Psychiatric: no depression, -  anxiety   Objective:    BP 114/78   Pulse 72   Ht 5\' 3"  (1.6 m)   Wt 165 lb 3.2 oz (74.9 kg)   BMI 29.26 kg/m   Wt Readings from Last 3 Encounters:   06/03/22 165 lb 3.2 oz (74.9 kg)  03/20/22 185 lb (83.9 kg)  03/12/22 160 lb 3.2 oz (72.7 kg)                                                Physical exam  Constitutional: Body mass index is 29.26 kg/m., not in acute distress, stable state of mind Eyes: PERRLA, EOMI, - exophthalmos ENT: moist mucous membranes, -  thyromegaly, no cervical lymphadenopathy Cardiovascular: + normal  precordial activity, -tachycardic,  no Murmur/Rubs/Gallops Respiratory:  adequate breathing efforts, no gross chest deformity, Clear to auscultation bilaterally Gastrointestinal: abdomen soft, Non -tender, No distension, Bowel Sounds present Musculoskeletal: no gross deformities, strength intact in all four extremities Skin: moist, warm, no rashes Neurological: +  tremor with outstretched hands,  - Deep Tendon Reflexes  on both lower extremities.   CMP     Component Value Date/Time   NA 139 09/08/2021 0855   K 3.7 09/08/2021 0855   CL 107 09/08/2021 0855   CO2 26 09/08/2021 0855   GLUCOSE 103 (H) 09/08/2021 0855   BUN 13 09/08/2021 0855   CREATININE 0.61 09/08/2021 0855   CREATININE 0.72 11/03/2013 1112   CALCIUM 9.1 09/08/2021 0855   PROT 7.8 09/08/2021 0855   ALBUMIN 4.1 09/08/2021 0855   AST 25 09/08/2021 0855   ALT 27 09/08/2021 0855   ALKPHOS 55 09/08/2021 0855   BILITOT 0.3 09/08/2021 0855   GFRNONAA >60 09/08/2021 0855   GFRNONAA >89 11/03/2013 1112   GFRAA >60 01/22/2020 1253   GFRAA >89 11/03/2013 1112     CBC    Component Value Date/Time   WBC 6.4 03/04/2022 1627   WBC 6.4 12/01/2021 1505   RBC 4.36 03/04/2022 1627   RBC 4.18 12/01/2021 1505   HGB 12.2 03/04/2022 1627   HCT 36.1 03/04/2022 1627   PLT 281 03/04/2022 1627   MCV 83 03/04/2022 1627   MCH 28.0 03/04/2022 1627   MCH 30.5 09/08/2021 0855   MCHC 33.8 03/04/2022 1627   MCHC 33.7 12/01/2021 1505   RDW 11.2 (L) 03/04/2022 1627   LYMPHSABS 2.6 03/04/2022 1627   MONOABS 0.5 12/01/2021 1505   EOSABS 0.1 03/04/2022  1627   BASOSABS 0.0 03/04/2022 1627     Diabetic Labs (most recent): No results found for: "HGBA1C", "MICROALBUR"  Lipid Panel     Component Value Date/Time   CHOL 153 02/10/2022 1606   TRIG 106 02/10/2022 1606   HDL 53 02/10/2022 1606   CHOLHDL 2.9 02/10/2022 1606   LDLCALC 81 02/10/2022 1606   LABVLDL 19 02/10/2022 1606     Lab Results  Component Value Date  TSH <0.005 (L) 04/09/2022   TSH <0.005 (L) 03/12/2022   TSH <0.005 (L) 03/04/2022   TSH 2.292 12/11/2020   FREET4 3.04 (H) 03/12/2022   FREET4 3.49 (H) 03/04/2022        Assessment & Plan:   1. Hyperthyroidism  she is being seen at a kind request of Lexine Baton. her history and most recent labs are reviewed, and she was examined clinically. Subjective and objective findings are consistent with thyrotoxicosis likely from primary hyperthyroidism. The potential risks of untreated thyrotoxicosis and the need for definitive therapy have been discussed in detail with her, and she agrees to proceed with diagnostic workup and treatment plan.   I like to obtain a confirmatory thyroid uptake and scan will be scheduled to be done as soon as possible.   Options of therapy are discussed with her.  We discussed the option of treating it with medications including methimazole or PTU which may have side effects including rash, transaminitis, and bone marrow suppression.  We  also discussed the option of definitive therapy with RAI ablation of the thyroid.   If  she is found to have primary hyperthyroidism from Graves' disease , toxic multinodular goiter or toxic nodular goiter the preferred modality of treatment would be I-131 thyroid ablation.    -Patient is made aware of the high likelihood of post ablative hypothyroidism with subsequent need for lifelong thyroid hormone replacement. she understands this outcome  and she is  willing to proceed.  Although surgery is one other choice of treatment in some cases, in her  case surgery is not a good fit for presentation with only mild goiter.    she will return in 20 days for treatment decision.   I did not initiate any beta-blocker today.  Her pulse rate is 72.   -Patient is advised to maintain close follow up with Jake Shark, PA-C for primary care needs.   - Time spent with the patient: 50 minutes, of which >50% was spent in obtaining information about her symptoms, reviewing her previous labs, evaluations, and treatments, counseling her about her hypothyroidism, and developing a plan to confirm the diagnosis and long term treatment as necessary. Please refer to " Patient Self Inventory" in the Media  tab for reviewed elements of pertinent patient history.  Charlotte Sanes participated in the discussions, expressed understanding, and voiced agreement with the above plans.  All questions were answered to her satisfaction. she is encouraged to contact clinic should she have any questions or concerns prior to her return visit.   Follow up plan: Return in about 3 weeks (around 06/24/2022) for F/U with Thyroid Uptake and Scan.   Thank you for involving me in the care of this pleasant patient, and I will continue to update you with her progress.  Marquis Lunch, MD [facility] Phone: [phone]  Fax: [phone]   06/03/2022, 6:54 PM  This note was partially dictated with voice recognition software. Similar sounding words can be transcribed inadequately or may not  be corrected upon review.

## 2022-06-09 ENCOUNTER — Telehealth: Payer: Self-pay | Admitting: "Endocrinology

## 2022-06-09 ENCOUNTER — Ambulatory Visit: Payer: No Typology Code available for payment source | Admitting: "Endocrinology

## 2022-06-09 NOTE — Telephone Encounter (Signed)
Patient left a VM stating she has not heard from anyone in regards to scheduling her uptake and scan. She is asking for a call back

## 2022-06-10 NOTE — Telephone Encounter (Signed)
Resent to central sched

## 2022-06-10 NOTE — Telephone Encounter (Signed)
Sent to [facility]

## 2022-06-10 NOTE — Telephone Encounter (Signed)
[facility] is where the order was supposed to go.

## 2022-06-30 ENCOUNTER — Encounter: Payer: Self-pay | Admitting: Internal Medicine

## 2022-07-01 ENCOUNTER — Ambulatory Visit: Payer: No Typology Code available for payment source | Admitting: "Endocrinology

## 2022-07-02 ENCOUNTER — Encounter
Admission: RE | Admit: 2022-07-02 | Discharge: 2022-07-02 | Disposition: A | Discharge status: Home / Self Care | Payer: No Typology Code available for payment source | Source: Ambulatory Visit | Attending: "Endocrinology | Admitting: "Endocrinology

## 2022-07-02 DIAGNOSIS — E0590 Thyrotoxicosis, unspecified without thyrotoxic crisis or storm: Secondary | ICD-10-CM | POA: Insufficient documentation

## 2022-07-02 DIAGNOSIS — E0500 Thyrotoxicosis with diffuse goiter without thyrotoxic crisis or storm: Secondary | ICD-10-CM | POA: Insufficient documentation

## 2022-07-02 MED ORDER — SODIUM IODIDE I-123 7.4 MBQ CAPS
280.1000 | ORAL_CAPSULE | Freq: Once | ORAL | Status: AC
  Administered 2022-07-02: 280.1 via ORAL

## 2022-07-03 ENCOUNTER — Encounter
Admission: RE | Admit: 2022-07-03 | Discharge: 2022-07-03 | Disposition: A | Discharge status: Home / Self Care | Payer: No Typology Code available for payment source | Source: Ambulatory Visit | Attending: "Endocrinology | Admitting: "Endocrinology

## 2022-07-27 ENCOUNTER — Ambulatory Visit: Payer: No Typology Code available for payment source | Admitting: "Endocrinology

## 2022-07-30 ENCOUNTER — Ambulatory Visit (INDEPENDENT_AMBULATORY_CARE_PROVIDER_SITE_OTHER): Payer: No Typology Code available for payment source | Admitting: "Endocrinology

## 2022-07-30 ENCOUNTER — Encounter: Payer: Self-pay | Admitting: "Endocrinology

## 2022-07-30 VITALS — BP 110/78 | HR 56 | Ht 63.0 in | Wt 165.4 lb

## 2022-07-30 DIAGNOSIS — E0500 Thyrotoxicosis with diffuse goiter without thyrotoxic crisis or storm: Secondary | ICD-10-CM | POA: Diagnosis not present

## 2022-07-30 DIAGNOSIS — E0590 Thyrotoxicosis, unspecified without thyrotoxic crisis or storm: Secondary | ICD-10-CM

## 2022-07-30 NOTE — Progress Notes (Signed)
07/30/2022      Endocrinology follow-up note    Subjective:    Patient ID: Joyce Rice, female    DOB: [birth year], PCP Jake Shark, PA-C.   Past Medical History:  Diagnosis Date   Abdominal pain 10/08/2021   Bartholin cyst    Chlamydia    Chlamydia 12/29/2011   Azithromycin given 12/29/11, vomited. Partner Tx per pt. Rx Doxy 01/29/12.    Diarrhea 10/08/2021   Headache    Infection    UTI   Irregular periods/menstrual cycles    PONV (postoperative nausea and vomiting)     Past Surgical History:  Procedure Laterality Date   COLONOSCOPY  10/14/2021   DILATION AND CURETTAGE OF UTERUS     INDUCED ABORTION     LAPAROSCOPIC TUBAL LIGATION Bilateral 05/26/2016   Procedure: LAPAROSCOPIC TUBAL LIGATION;  Surgeon: Catalina Antigua, MD;  Location: [facility];  Service: Gynecology;  Laterality: Bilateral;    Social History   Socioeconomic History   Marital status: Single    Spouse name: Not on file   Number of children: Not on file   Years of education: Not on file   Highest education level: Not on file  Occupational History   Not on file  Tobacco Use   Smoking status: Never   Smokeless tobacco: Never  Vaping Use   Vaping Use: Never used  Substance and Sexual Activity   Alcohol use: Yes    Comment: once or twice a year   Drug use: No   Sexual activity: Not Currently    Birth control/protection: Surgical  Other Topics Concern   Not on file  Social History Narrative   Not on file   Social Determinants of Health   Financial Resource Strain: Not on file  Food Insecurity: Not on file  Transportation Needs: Not on file  Physical Activity: Not on file  Stress: Not on file  Social Connections: Not on file    Family History  Problem Relation Age of Onset   Hearing loss Neg Hx    Colon cancer Neg Hx    Esophageal cancer Neg Hx     Outpatient Encounter Medications as of 07/30/2022  Medication Sig   Multiple Vitamin (MULTIVITAMIN ADULT PO) Take 1  tablet by mouth daily.   [DISCONTINUED] dicyclomine (BENTYL) 10 MG capsule Take 1 capsule (10 mg total) by mouth every 6 (six) hours as needed for spasms. (Patient not taking: Reported on 06/03/2022)   No facility-administered encounter medications on file as of 07/30/2022.    ALLERGIES: No Known Allergies  VACCINATION STATUS: There is no immunization history for the selected administration types on file for this patient.   HPI  Joyce Rice is 36 y.o. female who presents today with a medical history as above. she is being seen in follow-up after she was seen in consultation for hyperthyroidism requested by Lexine Baton.  she has been dealing with symptoms of weight loss of 20 pounds since August 2023 , mild tremors, and mild sleep disturbance. These symptoms are progressively worsening and troubling to her. her most recent thyroid labs revealed suppressed TSH on 3 occasions and elevated thyroid levels on 2 occasions.    After her last visit, she was sent for thyroid uptake and scan which confirms 54% uptake in 24 hours consistent with Graves' disease. she denies dysphagia, choking, shortness of breath, no recent voice change.    she denies family history of thyroid dysfunction , nor thyroid malignancy.  she denies personal history of goiter. she is not on any anti-thyroid medications nor on any thyroid hormone supplements. she  is willing to proceed with appropriate work up and therapy for thyrotoxicosis.                           Review of systems  Constitutional: + weight loss, + fatigue, + subjective hyperthermia Eyes: no blurry vision, - xerophthalmia ENT: no sore throat, no nodules palpated in throat, no dysphagia/odynophagia, nor hoarseness Cardiovascular: no Chest Pain, no Shortness of Breath, +  palpitations, no leg swelling Respiratory: no cough, no SOB Gastrointestinal: no Nausea, no Vomiting, no Diarhhea Musculoskeletal: no muscle/joint aches Skin: no  rashes Neurological: +  tremors, no numbness, no tingling, no dizziness Psychiatric: no depression, -  anxiety   Objective:    BP 110/78   Pulse (!) 56   Ht 5\' 3"  (1.6 m)   Wt 165 lb 6.4 oz (75 kg)   LMP 06/16/2022 (Exact Date) Comment: Tubal Ligation  BMI 29.30 kg/m   Wt Readings from Last 3 Encounters:  07/30/22 165 lb 6.4 oz (75 kg)  06/03/22 165 lb 3.2 oz (74.9 kg)  03/20/22 185 lb (83.9 kg)                                                Physical exam  Constitutional: Body mass index is 29.3 kg/m., not in acute distress, stable state of mind Eyes: PERRLA, EOMI, - exophthalmos ENT: moist mucous membranes, -  thyromegaly, no cervical lymphadenopathy Cardiovascular: + normal  precordial activity, -tachycardic,  no Murmur/Rubs/Gallops Respiratory:  adequate breathing efforts, no gross chest deformity, Clear to auscultation bilaterally Gastrointestinal: abdomen soft, Non -tender, No distension, Bowel Sounds present Musculoskeletal: no gross deformities, strength intact in all four extremities Skin: moist, warm, no rashes Neurological: +  tremor with outstretched hands,  - Deep Tendon Reflexes  on both lower extremities.   CMP     Component Value Date/Time   NA 139 09/08/2021 0855   K 3.7 09/08/2021 0855   CL 107 09/08/2021 0855   CO2 26 09/08/2021 0855   GLUCOSE 103 (H) 09/08/2021 0855   BUN 13 09/08/2021 0855   CREATININE 0.61 09/08/2021 0855   CREATININE 0.72 11/03/2013 1112   CALCIUM 9.1 09/08/2021 0855   PROT 7.8 09/08/2021 0855   ALBUMIN 4.1 09/08/2021 0855   AST 25 09/08/2021 0855   ALT 27 09/08/2021 0855   ALKPHOS 55 09/08/2021 0855   BILITOT 0.3 09/08/2021 0855   GFRNONAA >60 09/08/2021 0855   GFRNONAA >89 11/03/2013 1112   GFRAA >60 01/22/2020 1253   GFRAA >89 11/03/2013 1112     CBC    Component Value Date/Time   WBC 6.4 03/04/2022 1627   WBC 6.4 12/01/2021 1505   RBC 4.36 03/04/2022 1627   RBC 4.18 12/01/2021 1505   HGB 12.2 03/04/2022 1627    HCT 36.1 03/04/2022 1627   PLT 281 03/04/2022 1627   MCV 83 03/04/2022 1627   MCH 28.0 03/04/2022 1627   MCH 30.5 09/08/2021 0855   MCHC 33.8 03/04/2022 1627   MCHC 33.7 12/01/2021 1505   RDW 11.2 (L) 03/04/2022 1627   LYMPHSABS 2.6 03/04/2022 1627   MONOABS 0.5 12/01/2021 1505   EOSABS 0.1 03/04/2022 1627   BASOSABS 0.0 03/04/2022 1627  Diabetic Labs (most recent): No results found for: "HGBA1C", "MICROALBUR"  Lipid Panel     Component Value Date/Time   CHOL 153 02/10/2022 1606   TRIG 106 02/10/2022 1606   HDL 53 02/10/2022 1606   CHOLHDL 2.9 02/10/2022 1606   LDLCALC 81 02/10/2022 1606   LABVLDL 19 02/10/2022 1606     Lab Results  Component Value Date   TSH <0.005 (L) 04/09/2022   TSH <0.005 (L) 03/12/2022   TSH <0.005 (L) 03/04/2022   TSH 2.292 12/11/2020   FREET4 3.04 (H) 03/12/2022   FREET4 3.49 (H) 03/04/2022        Assessment & Plan:   1. Hyperthyroidism  she is being seen at a kind request of Lexine Baton. her history and most recent labs and thyroid uptake and scan results were reviewed with her.    Subjective and objective findings are consistent with thyrotoxicosis likely from primary hyperthyroidism.  Her studies so far confirms primary hyperthyroidism from Graves' disease.  The potential risks of untreated thyrotoxicosis and the need for definitive therapy including the risk of thyroid storm have been discussed in detail with her.    Options of therapy are discussed with her.  We discussed the option of treating it with medications including methimazole or PTU which may have side effects including rash, transaminitis, and bone marrow suppression.  Her best option of treatment will be definitive radioactive iodine thyroid ablation.  Either way, she could not make a decision today.  She would like to go home and discuss with her husband and she will call back with her decision.  I urged her to inform us in 7 to 10 days.   If she  declines radioactive iodine treatment, she will be given methimazole starting with 10 mg p.o. daily.  -Patient is made aware of the high likelihood of post ablative hypothyroidism with subsequent need for lifelong thyroid hormone replacement.   Although surgery is one other choice of treatment in some cases, in her case surgery is not a good fit for presentation with only mild goiter.   Her pulse rate is 56,   I did not initiate beta-blockers for her.  -Patient is advised to maintain close follow up with Jake Shark, PA-C for primary care needs.  I spent 26 minutes in the care of the patient today including review of labs from Thyroid Function, CMP, and other relevant labs ; imaging/biopsy records (current and previous including abstractions from other facilities); face-to-face time discussing  her lab results and symptoms, medications doses, her options of short and long term treatment based on the latest standards of care / guidelines;   and documenting the encounter.  Joyce Rice  participated in the discussions, expressed understanding, and voiced agreement with the above plans.  All questions were answered to her satisfaction. she is encouraged to contact clinic should she have any questions or concerns prior to her return visit.   Follow up plan: Return in about 9 weeks (around 10/01/2022), or she did not make a decision for treatment today, she will call in 1 week for treatment decision, for F/U with Pre-visit Labs.   Thank you for involving me in the care of this pleasant patient, and I will continue to update you with her progress.  Marquis Lunch, MD [facility] Phone: [phone]  Fax: [phone]   07/30/2022, 7:33 PM  This note was partially dictated with voice recognition software. Similar sounding words can be transcribed  inadequately or may not  be corrected upon review.

## 2022-08-04 ENCOUNTER — Telehealth: Payer: Self-pay

## 2022-08-04 NOTE — Telephone Encounter (Signed)
Left a message requesting pt return call to the office. ?

## 2022-08-05 NOTE — Telephone Encounter (Signed)
Left a message requesting pt return call to the office. ?

## 2022-08-13 NOTE — Telephone Encounter (Signed)
Left a message requesting pt return call to the office. ?

## 2022-08-20 NOTE — Telephone Encounter (Signed)
Discussed with pt, understanding voiced. She stated she would call our office back once she decides which treatment plan she would like to try.

## 2022-08-20 NOTE — Telephone Encounter (Signed)
Spoke with pt, she asked if she decided to try the methimazole how long would she need to take the medication and how often would she need lab work, also if the medication does not work well to suppress her thyroid would RAI treatment still be an option.

## 2022-08-26 NOTE — Progress Notes (Signed)
Chief Complaint  Patient presents with   other    Stomach pain started last week, worse at night but it occurs throughout the day, pt. Has frequent urination so I collected a urine sample.    She had a stomach virus (the week of Christmas, 12/28)--nausea, vomiting, diarrhea. She had recurrent vomiting and diarrhea last week 1/3, diarrhea lasted 2-3 day. Last week she had lower abdominal pain, also had pain in her back. Currently she feels okay, but had pain earlier today.  Symptoms come and go. This week bowels have been normal. She had a normal bowel movement, no change in pain related to moving bowels. She has had some ongoing GI issues, has seen GI, last in August. Review of records showed 08/2021 CT showing some wall thickening in colon, possible recent ruptured ovarian follicle, fatty infiltration. She was treated with Augmentin. Repeat CT in 03/2022 was normal.  She has been prescribed Bentyl.  Didn't find this helpful in her pain.  She also reports some urinary urgency, but drinks a lot of water, thinks it is more frequent. Urine is slightly cloudy, mostly clear, no odor. She has some vaginal discharge, white, not thick, sometimes is a little itchy.  S/p BTL. Monogamous relationship x 15 years. Wants STD check, due to discharge and irritation  Periods are now monthly (previously  were irregular), but sometimes last longer.  Grave's disease. She has been seeing Dr. Dorris Fetch.  Discussed treatment options, deciding between methimazole and RAI treatment. Needs to make decision and contact endo's office   PMH, PSH, [facility] reviewed  Outpatient Encounter Medications as of 08/27/2022  Medication Sig Note   Multiple Vitamin (MULTIVITAMIN ADULT PO) Take 1 tablet by mouth daily. (Patient not taking: Reported on 08/27/2022) 08/27/2022: Not taking right now   No facility-administered encounter medications on file as of 08/27/2022.   No Known Allergies  ROS:  no fever, chills, URI symptoms.  GI and GU  complaints are reported in HPI No recent vomiting (not since 1/3), bowels are now normal.  No chest pain, palpitations, shortness of breath.   PHYSICAL EXAM:  BP 118/78   Pulse 60   Temp 98.5 F (36.9 C)   Wt 166 lb 12.8 oz (75.7 kg)   LMP 08/08/2022   BMI 29.55 kg/m   Well-appearing, pleasant female in no distress HEENT: conjunctiva and sclera are clear, EOMI, no exophthalmos Neck: no lymphadenopathy or thyromegaly Heart: regular rate and rhythm, no murmur Lungs: clear bilaterally Back: no CVA tenderness Abdomen: soft, normal bowel sounds. Mild epigastric tenderness (chronic per pt), nontender elsewhere, no masses GU: normal external genitalia, no lesions. BUS and vagina normal. Cervix normal without lesions.  Small amount of thin white discharge noted, no odor.  No cervical motion tenderness, no uterine or adnexal enlargement or tenderness Extremities: no edema  Urine dip normal   ASSESSMENT/PLAN:  Generalized abdominal pain - unclear etiology, benign exam.  Somewhat chronic, under care of GI. Normal urine, check labs. f/u with GI if not improving - Plan: CBC with Differential/Platelet, Comprehensive metabolic panel  Urinary frequency - Plan: POCT Urinalysis DIP (Proadvantage Device)  Vaginal discharge - appears normal/physiologic.  Will send Nuswab, pt wanting STD check, not at risk - Plan: NuSwab Vaginitis Plus (VG+)  Screen for STD (sexually transmitted disease) - Plan: RPR, HIV Antibody (routine testing w rflx), NuSwab Vaginitis Plus (VG+)  Graves disease - encouraged to f/u with Dr. Liliane Channel office and decide on plan for treatment  Nuswab Cbc, c-met, HIV, RPR  Addendum--NuSwab + for trich and candida glabrata. Rx flagyl x 7d. OTC monistat

## 2022-08-27 ENCOUNTER — Ambulatory Visit (INDEPENDENT_AMBULATORY_CARE_PROVIDER_SITE_OTHER): Payer: No Typology Code available for payment source | Admitting: Family Medicine

## 2022-08-27 ENCOUNTER — Encounter: Payer: Self-pay | Admitting: Family Medicine

## 2022-08-27 ENCOUNTER — Ambulatory Visit: Payer: No Typology Code available for payment source | Admitting: Nurse Practitioner

## 2022-08-27 VITALS — BP 118/78 | HR 60 | Temp 98.5°F | Wt 166.8 lb

## 2022-08-27 DIAGNOSIS — E0500 Thyrotoxicosis with diffuse goiter without thyrotoxic crisis or storm: Secondary | ICD-10-CM

## 2022-08-27 DIAGNOSIS — R1084 Generalized abdominal pain: Secondary | ICD-10-CM

## 2022-08-27 DIAGNOSIS — R350 Frequency of micturition: Secondary | ICD-10-CM

## 2022-08-27 DIAGNOSIS — N898 Other specified noninflammatory disorders of vagina: Secondary | ICD-10-CM

## 2022-08-27 DIAGNOSIS — Z113 Encounter for screening for infections with a predominantly sexual mode of transmission: Secondary | ICD-10-CM

## 2022-08-27 LAB — POCT URINALYSIS DIP (PROADVANTAGE DEVICE)
Bilirubin, UA: NEGATIVE
Blood, UA: NEGATIVE
Glucose, UA: NEGATIVE mg/dL
Ketones, POC UA: NEGATIVE mg/dL
Leukocytes, UA: NEGATIVE
Nitrite, UA: NEGATIVE
Protein Ur, POC: NEGATIVE mg/dL
Specific Gravity, Urine: 1.015
Urobilinogen, Ur: 0.2
pH, UA: 6 (ref 5.0–8.0)

## 2022-08-27 NOTE — Patient Instructions (Addendum)
We are sending off tests to evaluate your vaginal discharge and to test for STDs. We are also checking blood counts and chem panel (kidney/liver tests). Your urine was normal, no evidence of infection.  I encourage you to take some probiotics for a few weeks. Try and eat a bland diet, limiting dairy products. It is possible that your thyroid could be affecting your stomach.   Follow up with your GI if you continue to have pain or abnormal bowels.  Be sure to contact Dr. Liliane Channel office once you have made a decision regarding your treatment for Grave's disease.

## 2022-08-28 ENCOUNTER — Encounter: Payer: Self-pay | Admitting: Family Medicine

## 2022-08-28 LAB — CBC WITH DIFFERENTIAL/PLATELET
Basophils Absolute: 0 10*3/uL (ref 0.0–0.2)
Basos: 0 %
EOS (ABSOLUTE): 0.2 10*3/uL (ref 0.0–0.4)
Eos: 2 %
Hematocrit: 39.2 % (ref 34.0–46.6)
Hemoglobin: 13 g/dL (ref 11.1–15.9)
Immature Grans (Abs): 0 10*3/uL (ref 0.0–0.1)
Immature Granulocytes: 0 %
Lymphocytes Absolute: 3.2 10*3/uL — ABNORMAL HIGH (ref 0.7–3.1)
Lymphs: 36 %
MCH: 30.1 pg (ref 26.6–33.0)
MCHC: 33.2 g/dL (ref 31.5–35.7)
MCV: 91 fL (ref 79–97)
Monocytes Absolute: 0.6 10*3/uL (ref 0.1–0.9)
Monocytes: 6 %
Neutrophils Absolute: 4.9 10*3/uL (ref 1.4–7.0)
Neutrophils: 56 %
Platelets: 281 10*3/uL (ref 150–450)
RBC: 4.32 x10E6/uL (ref 3.77–5.28)
RDW: 11.7 % (ref 11.7–15.4)
WBC: 8.9 10*3/uL (ref 3.4–10.8)

## 2022-08-28 LAB — COMPREHENSIVE METABOLIC PANEL
ALT: 12 IU/L (ref 0–32)
AST: 16 IU/L (ref 0–40)
Albumin/Globulin Ratio: 1.8 (ref 1.2–2.2)
Albumin: 4.8 g/dL (ref 3.9–4.9)
Alkaline Phosphatase: 82 IU/L (ref 44–121)
BUN/Creatinine Ratio: 15 (ref 9–23)
BUN: 12 mg/dL (ref 6–20)
Bilirubin Total: 0.3 mg/dL (ref 0.0–1.2)
CO2: 20 mmol/L (ref 20–29)
Calcium: 9.4 mg/dL (ref 8.7–10.2)
Chloride: 105 mmol/L (ref 96–106)
Creatinine, Ser: 0.82 mg/dL (ref 0.57–1.00)
Globulin, Total: 2.6 g/dL (ref 1.5–4.5)
Glucose: 96 mg/dL (ref 70–99)
Potassium: 4.3 mmol/L (ref 3.5–5.2)
Sodium: 143 mmol/L (ref 134–144)
Total Protein: 7.4 g/dL (ref 6.0–8.5)
eGFR: 96 mL/min/{1.73_m2} (ref >59)

## 2022-08-28 LAB — HIV ANTIBODY (ROUTINE TESTING W REFLEX): HIV Screen 4th Generation wRfx: NONREACTIVE

## 2022-08-28 LAB — RPR: RPR Ser Ql: NONREACTIVE

## 2022-09-01 LAB — NUSWAB VAGINITIS PLUS (VG+)
Candida albicans, NAA: NEGATIVE
Candida glabrata, NAA: POSITIVE — AB
Chlamydia trachomatis, NAA: NEGATIVE
Neisseria gonorrhoeae, NAA: NEGATIVE
Trich vag by NAA: POSITIVE — AB

## 2022-09-01 MED ORDER — METRONIDAZOLE 500 MG PO TABS: 500.0000 mg | ORAL_TABLET | Freq: Two times a day (BID) | ORAL | 0 refills | Status: DC

## 2022-09-01 NOTE — Addendum Note (Signed)
Addended by: Rita Ohara on: 09/01/2022 08:45 PM   Modules accepted: Orders

## 2022-09-03 ENCOUNTER — Encounter: Payer: Self-pay | Admitting: *Deleted

## 2022-09-03 ENCOUNTER — Telehealth: Payer: Self-pay | Admitting: Nurse Practitioner

## 2022-09-03 MED ORDER — BORIC ACID VAGINAL 600 MG VA SUPP: 1.0000 | Freq: Every day | VAGINAL | 0 refills | Status: DC

## 2022-09-03 MED ORDER — METRONIDAZOLE 500 MG PO TABS: 500.0000 mg | ORAL_TABLET | Freq: Two times a day (BID) | ORAL | 0 refills | Status: DC

## 2022-09-03 NOTE — Telephone Encounter (Signed)
She has no allergies listed in her chart. What is the issue with Monistat? Find out, so that we can document as side effect/allergy/etc in her chart so I'll know for future.  Pharmacies can transfer the flagyl.  (She had no other pharmacy listed in her chart, only the one I sent it to).  I sent the Boric acid suppositories to [facility]

## 2022-09-03 NOTE — Telephone Encounter (Signed)
Patient advised that rx was transferred and boric acid sent to her pharmacy. Monistat causes irritation and a rash, noted in chart.

## 2022-09-03 NOTE — Telephone Encounter (Signed)
Pt asks if you can give her a call, she has a question about the prescription Dr.Knapp put her on. She wouldn't provide me with the question.

## 2022-09-03 NOTE — Telephone Encounter (Signed)
Patient called regarding her results and she stated that she cannot use monistat. You had recommended boric acid, can this be called in? Also stated flagyl was sent to incorrect pharmacy, can this be sent to [facility] in [place]? (I can send if you are ok with this). And lastly she is asking if the trich came from her partner since she has not been with anyone else?

## 2022-09-04 ENCOUNTER — Other Ambulatory Visit: Payer: Self-pay | Admitting: Family Medicine

## 2022-09-04 ENCOUNTER — Telehealth: Payer: Self-pay | Admitting: Nurse Practitioner

## 2022-09-04 DIAGNOSIS — B9689 Other specified bacterial agents as the cause of diseases classified elsewhere: Secondary | ICD-10-CM

## 2022-09-04 MED ORDER — BORIC ACID VAGINAL 600 MG VA SUPP: 1.0000 | Freq: Every day | VAGINAL | 0 refills | Status: DC

## 2022-09-04 NOTE — Telephone Encounter (Signed)
Pt needs another prescription for her metroNIDAZOLE, she says she has lost the prescription and wasn't able to get it.

## 2022-09-04 NOTE — Telephone Encounter (Signed)
Boric acid was sent to [facility] in [place] to be compounded

## 2022-09-05 MED ORDER — METRONIDAZOLE 500 MG PO TABS: 500.0000 mg | ORAL_TABLET | Freq: Two times a day (BID) | ORAL | 0 refills | Status: DC

## 2022-09-07 ENCOUNTER — Other Ambulatory Visit: Payer: Self-pay

## 2022-09-07 ENCOUNTER — Telehealth: Payer: Self-pay | Admitting: Nurse Practitioner

## 2022-09-07 DIAGNOSIS — B9689 Other specified bacterial agents as the cause of diseases classified elsewhere: Secondary | ICD-10-CM

## 2022-09-07 MED ORDER — METRONIDAZOLE 500 MG PO TABS: 500.0000 mg | ORAL_TABLET | Freq: Two times a day (BID) | ORAL | 0 refills | Status: AC

## 2022-09-07 NOTE — Telephone Encounter (Signed)
Pt asks if you can give her a call about her metroNIDAZOLE , she wouldn't provide any other details.

## 2022-09-15 NOTE — Telephone Encounter (Signed)
Does pt  need to still be seen next week with labs

## 2022-09-15 NOTE — Telephone Encounter (Signed)
Noted  

## 2022-09-15 NOTE — Telephone Encounter (Signed)
She was supposed to call us back to let us know which option she wanted to choose whether medication or ablation but I have not heard back from her.

## 2022-09-17 ENCOUNTER — Telehealth: Payer: Self-pay | Admitting: "Endocrinology

## 2022-09-17 NOTE — Telephone Encounter (Signed)
Pt left a VM requesting a call back

## 2022-09-17 NOTE — Telephone Encounter (Signed)
Opened in error

## 2022-09-18 NOTE — Telephone Encounter (Signed)
Left a message requesting pt to return call to the office. 

## 2022-09-21 ENCOUNTER — Telehealth: Payer: Self-pay | Admitting: "Endocrinology

## 2022-09-21 NOTE — Telephone Encounter (Signed)
Pt called requesting a call back from the nurse to verify blood work is needed.  She canceled her appointment for 09/24/22 and will reschedule once she speaks to someone.

## 2022-09-21 NOTE — Telephone Encounter (Signed)
Needed to addend another telephone encounter instead of opening a new one.

## 2022-09-21 NOTE — Telephone Encounter (Signed)
Spoke with pt, confirmed with her that she does need lab work prior to appointment. Understanding voiced.

## 2022-09-24 ENCOUNTER — Ambulatory Visit: Payer: No Typology Code available for payment source | Admitting: "Endocrinology

## 2022-09-29 ENCOUNTER — Telehealth: Payer: Self-pay

## 2022-09-29 NOTE — Telephone Encounter (Signed)
Left a message requesting pt to return call to the office.

## 2022-10-01 ENCOUNTER — Ambulatory Visit (INDEPENDENT_AMBULATORY_CARE_PROVIDER_SITE_OTHER): Payer: No Typology Code available for payment source | Admitting: Nurse Practitioner

## 2022-10-01 ENCOUNTER — Encounter: Payer: Self-pay | Admitting: Nurse Practitioner

## 2022-10-01 VITALS — BP 110/70 | HR 64 | Wt 170.6 lb

## 2022-10-01 DIAGNOSIS — N760 Acute vaginitis: Secondary | ICD-10-CM

## 2022-10-01 DIAGNOSIS — A599 Trichomoniasis, unspecified: Secondary | ICD-10-CM | POA: Diagnosis not present

## 2022-10-01 DIAGNOSIS — Z0000 Encounter for general adult medical examination without abnormal findings: Secondary | ICD-10-CM | POA: Insufficient documentation

## 2022-10-01 DIAGNOSIS — B9689 Other specified bacterial agents as the cause of diseases classified elsewhere: Secondary | ICD-10-CM

## 2022-10-01 DIAGNOSIS — Z113 Encounter for screening for infections with a predominantly sexual mode of transmission: Secondary | ICD-10-CM

## 2022-10-01 DIAGNOSIS — E0590 Thyrotoxicosis, unspecified without thyrotoxic crisis or storm: Secondary | ICD-10-CM | POA: Diagnosis not present

## 2022-10-01 MED ORDER — TINIDAZOLE 500 MG PO TABS: 2.0000 g | ORAL_TABLET | Freq: Once | ORAL | 2 refills | Status: AC

## 2022-10-01 MED ORDER — BORIC ACID VAGINAL 600 MG VA SUPP: 1.0000 | Freq: Every day | VAGINAL | 2 refills | Status: DC

## 2022-10-01 NOTE — Assessment & Plan Note (Signed)
Recent trichomonal and candidal vaginal infection. She was unable to complete her treatment as she became ill with each dose of the metronidazole. Symptoms persist.  Plan: - repeat screening today - Will go ahead and send longer treatment with boric acid and

## 2022-10-01 NOTE — Patient Instructions (Addendum)
It was a pleasure to meet you today.   We will get testing today to make sure everything has resolved.   I have also checked for the thyroid.   I will let you know what the labs show- if we need to add treatment, I will let you know.

## 2022-10-01 NOTE — Progress Notes (Signed)
  Orma Render, DNP, AGNP-c [address] [place], [place] [address] [phone]  Subjective:   Joyce Rice is a 37 y.o. female presents to day for evaluation of: She was last seen one month ago by Dr. Tomi Bamberger in this office for abdominal pain. Testing was positive for trich and candida glabrate. Treatment was provided with Flagyl x7 days and Monistat. She is here today for follow-up.   She got sick on the flagyl and was unable to complete the treatment. She is also unable to use vaginal creams as these irritate her severely, therefore she was not able to complete the monistat recommend. She tells me she is still having symptoms of vaginal discharge, irritation, and odor.   PMH, Medications, and Allergies reviewed and updated in chart as appropriate.   ROS negative except for what is listed in HPI. Objective:  BP 110/70   Pulse 64   Wt 170 lb 9.6 oz (77.4 kg)   LMP 09/04/2022   BMI 30.22 kg/m  Physical Exam Constitutional:      Appearance: Normal appearance.  HENT:     Head: Normocephalic.  Eyes:     Extraocular Movements: Extraocular movements intact.     Pupils: Pupils are equal, round, and reactive to light.  Cardiovascular:     Rate and Rhythm: Normal rate and regular rhythm.     Pulses: Normal pulses.     Heart sounds: Normal heart sounds.  Pulmonary:     Effort: Pulmonary effort is normal.     Breath sounds: Normal breath sounds.  Abdominal:     General: Bowel sounds are normal. There is no distension.     Palpations: Abdomen is soft.     Tenderness: There is no abdominal tenderness. There is no right CVA tenderness, left CVA tenderness or guarding.  Musculoskeletal:     Cervical back: Normal range of motion.  Skin:    General: Skin is warm and dry.     Capillary Refill: Capillary refill takes less than 2 seconds.  Neurological:     General: No focal deficit present.     Mental Status: She is alert and oriented to person, place,  and time.  Psychiatric:        Mood and Affect: Mood normal.           Assessment & Plan:   Problem List Items Addressed This Visit     Screen for STD (sexually transmitted disease) - Primary    Recent trichomonal and candidal vaginal infection. She was unable to complete her treatment as she became ill with each dose of the metronidazole. Symptoms persist.  Plan: - repeat screening today - Will go ahead and send longer treatment with boric acid and       Relevant Orders   NuSwab Vaginitis Plus (VG+) (Completed)   Hyperthyroidism   Relevant Orders   TSH (Completed)   T4, free (Completed)   Other Visit Diagnoses     BV (bacterial vaginosis)       Trichomonal infection             Orma Render, DNP, AGNP-c 10/06/2022  9:01 PM    History, Medications, Surgery, SDOH, and Family History reviewed and updated as appropriate.

## 2022-10-02 LAB — T4, FREE: Free T4: 1.08 ng/dL (ref 0.82–1.77)

## 2022-10-02 LAB — TSH: TSH: 2.19 u[IU]/mL (ref 0.450–4.500)

## 2022-10-04 LAB — NUSWAB VAGINITIS PLUS (VG+)
Candida albicans, NAA: NEGATIVE
Candida glabrata, NAA: POSITIVE — AB
Chlamydia trachomatis, NAA: NEGATIVE
Neisseria gonorrhoeae, NAA: NEGATIVE
Trich vag by NAA: NEGATIVE

## 2022-11-24 ENCOUNTER — Ambulatory Visit: Payer: No Typology Code available for payment source | Admitting: Nurse Practitioner

## 2022-11-24 ENCOUNTER — Encounter: Payer: Self-pay | Admitting: Nurse Practitioner

## 2022-11-24 VITALS — BP 110/80 | HR 82 | Wt 175.2 lb

## 2022-11-24 DIAGNOSIS — R350 Frequency of micturition: Secondary | ICD-10-CM | POA: Insufficient documentation

## 2022-11-24 DIAGNOSIS — R109 Unspecified abdominal pain: Secondary | ICD-10-CM | POA: Diagnosis not present

## 2022-11-24 DIAGNOSIS — R631 Polydipsia: Secondary | ICD-10-CM

## 2022-11-24 DIAGNOSIS — N898 Other specified noninflammatory disorders of vagina: Secondary | ICD-10-CM

## 2022-11-24 DIAGNOSIS — K580 Irritable bowel syndrome with diarrhea: Secondary | ICD-10-CM

## 2022-11-24 DIAGNOSIS — N3090 Cystitis, unspecified without hematuria: Secondary | ICD-10-CM

## 2022-11-24 LAB — POCT URINALYSIS DIP (CLINITEK)
Bilirubin, UA: NEGATIVE
Blood, UA: NEGATIVE
Glucose, UA: NEGATIVE mg/dL
Ketones, POC UA: NEGATIVE mg/dL
Nitrite, UA: NEGATIVE
POC PROTEIN,UA: NEGATIVE
Spec Grav, UA: 1.02 (ref 1.010–1.025)
Urobilinogen, UA: 0.2 E.U./dL
pH, UA: 6 (ref 5.0–8.0)

## 2022-11-24 LAB — HEMOGLOBIN A1C
Est. average glucose Bld gHb Est-mCnc: 111 mg/dL
Hgb A1c MFr Bld: 5.5 % (ref 4.8–5.6)

## 2022-11-24 MED ORDER — RIFAXIMIN 550 MG PO TABS: 550.0000 mg | ORAL_TABLET | Freq: Three times a day (TID) | ORAL | 0 refills | Status: DC

## 2022-11-24 NOTE — Patient Instructions (Addendum)
Lets try the rifaximin for 2 weeks and then see if that helps with your symptoms. If you do not have improvement, then please let me know.

## 2022-11-24 NOTE — Progress Notes (Signed)
Tollie Eth, DNP, AGNP-c [facility] [address] [place], [place] [address] [phone]  Subjective:   Joyce Rice is a 37 y.o. female presents to day for evaluation of:  Joyce Rice presents today with chief complaints of ongoing stomach issues, including watery diarrhea, vomiting, and stomach pains. The current symptoms have been present for 3 weeks. She denies fevers or blood in stool. The patient reports having a colonoscopy last year and undergoing various tests with no specific diagnosis. The patient has been on the BRAT diet for three weeks but has not experienced improvement in stool consistency. Vomiting occurred within the first few days of the current episode.  The patient has a history of similar symptoms, for which they underwent a CT of the abdomen and pelvis, colonoscopy, blood tests, and a stool sample. The patient was placed on a diet, but the symptoms have recurred. The patient has made dietary changes, such as cutting back on fried foods and coffee, and has started drinking herbal teas, but these have not provided relief.  The patient does not monitor stress levels and reports that symptoms typically occur at night or [place] in the morning. The patient's diet consists of soup, tacos, salads, and a high water intake. The patient has noticed weight gain, from 160 to 175 pounds, but thyroid levels were normal at the last check.  The patient had a CT in January 2023, which showed wall thickening in the abdomen and a treated ruptured cyst. The patient has also undergone an ultrasound and blood tests for stomach issues. The patient experiences frequent urination and thirst, and has tried various diets and exercise routines without success. The patient is concerned about blood sugar levels and the frequency of colonoscopies.  PMH, Medications, and Allergies reviewed and updated in chart as appropriate.   ROS negative except for what is listed in HPI. Objective:   BP 110/80   Pulse 82   Wt 175 lb 3.2 oz (79.5 kg)   LMP 10/30/2022   BMI 31.04 kg/m  Physical Exam Vitals and nursing note reviewed.  Constitutional:      General: She is not in acute distress.    Appearance: Normal appearance. She is not ill-appearing.  HENT:     Head: Normocephalic.     Nose: Nose normal.     Mouth/Throat:     Mouth: Mucous membranes are moist.  Eyes:     Extraocular Movements: Extraocular movements intact.     Pupils: Pupils are equal, round, and reactive to light.  Neck:     Vascular: No carotid bruit.  Cardiovascular:     Rate and Rhythm: Normal rate and regular rhythm.     Pulses: Normal pulses.     Heart sounds: Normal heart sounds.  Pulmonary:     Effort: Pulmonary effort is normal.     Breath sounds: Normal breath sounds.  Abdominal:     General: Bowel sounds are normal. There is no distension.     Palpations: Abdomen is soft. There is no mass.     Tenderness: There is abdominal tenderness. There is no right CVA tenderness, left CVA tenderness, guarding or rebound.     Comments: Generalized tenderness   Musculoskeletal:     Cervical back: Normal range of motion.     Right lower leg: No edema.     Left lower leg: No edema.  Skin:    General: Skin is warm and dry.     Capillary Refill: Capillary refill takes less than 2  seconds.  Neurological:     General: No focal deficit present.     Mental Status: She is alert and oriented to person, place, and time.     Motor: No weakness.  Psychiatric:        Mood and Affect: Mood normal.           Assessment & Plan:   Problem List Items Addressed This Visit     Abdominal pain - Primary    Intermittent diarrhea with cramping and occasional nausea and vomiting on and off for several years. The most recent episode has been ongoing for a few weeks now. She has undergone extensive testing with no clear etiology present. Symptoms appear to be consistent with IBS-D. I have discussed the diagnosis and  recommendations to help with management. She is interested in trying Rifaximin to see if this is helpful.  Plan: Rifaximin  three times a day for 2 weeks Monitor your diet closely for foods that trigger exacerbation and avoid these foods to reduce recurrence.  Consider use of imodium to help slow down bowel movements. GI referral may be warranted if symptoms continue.       Relevant Orders   POCT URINALYSIS DIP (CLINITEK) (Completed)   NuSwab Vaginitis Plus (VG+) (Completed)   Frequent urination    WBC present in UA in the office today suggesting cystitis. Will send treatment. Consider this as a trigger for the abdominal discomfort and symptoms recently.  Plan: Macrobid twice a day for 5 days Increase water intake      Relevant Orders   Hemoglobin A1c (Completed)   NuSwab Vaginitis Plus (VG+) (Completed)   Other Visit Diagnoses     Increased thirst       Relevant Orders   Hemoglobin A1c (Completed)   Irritable bowel syndrome with diarrhea       Relevant Medications   rifaximin (XIFAXAN) 550 MG TABS tablet   Cystitis       Relevant Orders   NuSwab Vaginitis Plus (VG+) (Completed)   Vaginal discharge       Relevant Orders   NuSwab Vaginitis Plus (VG+) (Completed)         Tollie Eth, DNP, AGNP-c 11/30/2022  7:31 PM    Time: 39 minutes, >50% spent counseling, care coordination, chart review, and documentation.   History, Medications, Surgery, SDOH, and Family History reviewed and updated as appropriate.

## 2022-11-25 ENCOUNTER — Other Ambulatory Visit: Payer: Self-pay | Admitting: Nurse Practitioner

## 2022-11-25 DIAGNOSIS — R8271 Bacteriuria: Secondary | ICD-10-CM

## 2022-11-25 MED ORDER — NITROFURANTOIN MONOHYD MACRO 100 MG PO CAPS
100.0000 mg | ORAL_CAPSULE | Freq: Two times a day (BID) | ORAL | 0 refills | Status: DC
Start: 2022-11-25 — End: 2022-11-26

## 2022-11-26 ENCOUNTER — Telehealth: Payer: Self-pay | Admitting: Nurse Practitioner

## 2022-11-26 ENCOUNTER — Other Ambulatory Visit: Payer: Self-pay

## 2022-11-26 DIAGNOSIS — R8271 Bacteriuria: Secondary | ICD-10-CM

## 2022-11-26 LAB — NUSWAB VAGINITIS PLUS (VG+)

## 2022-11-26 MED ORDER — NITROFURANTOIN MONOHYD MACRO 100 MG PO CAPS
100.0000 mg | ORAL_CAPSULE | Freq: Two times a day (BID) | ORAL | 0 refills | Status: DC
Start: 2022-11-26 — End: 2023-03-26

## 2022-11-26 MED ORDER — BREXAFEMME 150 MG PO TABS: 1.0000 | ORAL_TABLET | Freq: Two times a day (BID) | ORAL | 0 refills | Status: DC

## 2022-11-26 NOTE — Telephone Encounter (Signed)
Pt called and states that every time she is on a antibiotic she gets a yeast infection. She is requesting something be sent in for that. She states not diflucan and not boric acid. Please send to [facility] at [address] in [place]. Pt can be reached at [phone].

## 2022-11-26 NOTE — Telephone Encounter (Signed)
Brexafemme sent. This is more than likely a higher cost option but due to allergies and request to not have specific medications, this is the only option for treatment at this time. I recommend looking online to see if there is a coupon.

## 2022-11-27 ENCOUNTER — Telehealth: Payer: Self-pay

## 2022-11-27 ENCOUNTER — Telehealth: Payer: Self-pay | Admitting: Nurse Practitioner

## 2022-11-27 DIAGNOSIS — B3731 Acute candidiasis of vulva and vagina: Secondary | ICD-10-CM

## 2022-11-27 LAB — NUSWAB VAGINITIS PLUS (VG+)
Candida albicans, NAA: NEGATIVE
Chlamydia trachomatis, NAA: NEGATIVE

## 2022-11-27 MED ORDER — BREXAFEMME 150 MG PO TABS
2.0000 | ORAL_TABLET | Freq: Two times a day (BID) | ORAL | 0 refills | Status: AC
Start: 2022-11-27 — End: 2022-11-28

## 2022-11-27 NOTE — Telephone Encounter (Signed)
P.A. [employer] completed & approved. Also activated discount card & called pharmacy & still very expensive.  [phone] with ins and discount card.  Called pt and left message. [place] Pou is there anything else pt can be switched to?

## 2022-11-27 NOTE — Telephone Encounter (Signed)
This is for brexafemme 150mg  tabs.

## 2022-11-27 NOTE — Telephone Encounter (Signed)
Received fax stating, "This medication is typically dosed 300mg  q 12 H x 2 doses -- Please contact pharmacy to clarify intended dose. Must be dispensed as pack of four 150mg  tablets."

## 2022-11-30 ENCOUNTER — Telehealth: Payer: Self-pay | Admitting: Nurse Practitioner

## 2022-11-30 ENCOUNTER — Telehealth: Payer: Self-pay

## 2022-11-30 DIAGNOSIS — K580 Irritable bowel syndrome with diarrhea: Secondary | ICD-10-CM

## 2022-11-30 DIAGNOSIS — R1084 Generalized abdominal pain: Secondary | ICD-10-CM

## 2022-11-30 DIAGNOSIS — K219 Gastro-esophageal reflux disease without esophagitis: Secondary | ICD-10-CM

## 2022-11-30 NOTE — Telephone Encounter (Signed)
Pt states that the Brexafemme needs P.A.

## 2022-11-30 NOTE — Assessment & Plan Note (Signed)
Intermittent diarrhea with cramping and occasional nausea and vomiting on and off for several years. The most recent episode has been ongoing for a few weeks now. She has undergone extensive testing with no clear etiology present. Symptoms appear to be consistent with IBS-D. I have discussed the diagnosis and recommendations to help with management. She is interested in trying Rifaximin to see if this is helpful.  Plan: Rifaximin 550mg  three times a day for 2 weeks Monitor your diet closely for foods that trigger exacerbation and avoid these foods to reduce recurrence.  Consider use of imodium to help slow down bowel movements. GI referral may be warranted if symptoms continue.

## 2022-11-30 NOTE — Assessment & Plan Note (Signed)
WBC present in UA in the office today suggesting cystitis. Will send treatment. Consider this as a trigger for the abdominal discomfort and symptoms recently.  Plan: Macrobid twice a day for 5 days Increase water intake

## 2022-11-30 NOTE — Telephone Encounter (Signed)
Pt. Called LM stating that she was unable to get the rifaximin script and stated you had mentioned her getting a referral for an colonoscopy. She wanted to know if we could put in a referral to [facility] for a colonoscopy.

## 2022-12-02 ENCOUNTER — Telehealth: Payer: Self-pay | Admitting: Nurse Practitioner

## 2022-12-02 ENCOUNTER — Telehealth: Payer: Self-pay | Admitting: Gastroenterology

## 2022-12-02 NOTE — Telephone Encounter (Signed)
Pt called back after calling her insurance and she has high deductible of $2700 that is why Xifaxan cost after approval is still $1000 & Brexafemme will cost $650 & she can't afford these.   Pt asked that you call in Boric acid to [facility] in [place] 30 days worth to [facility] in [place].

## 2022-12-02 NOTE — Telephone Encounter (Signed)
P.A. approved til 12/15/22, called pharmacy & cost if $523.  Pt can't afford and wants different medication sent in

## 2022-12-02 NOTE — Telephone Encounter (Signed)
Inbound call from patient, is requesting sooner appointment. Patient called with symptoms of IBS, GERD and abdominal pain. Stated she would like to speak with a nurse to further advise or see if she can be seen sooner than appointment scheduled for 7/2. Please advise.

## 2022-12-02 NOTE — Telephone Encounter (Signed)
We can try Bentyl to see if this is helpful for symptoms, but there is not an alternative to the Rifaximin at this time. If she would like to try the Bentyl, I am fine with sending that in for three times a day as needed for cramping.

## 2022-12-02 NOTE — Telephone Encounter (Signed)
Pt wants to try this, send telephone call to [facility]

## 2022-12-03 MED ORDER — IBGARD 90 MG PO CPCR: 2.0000 | ORAL_CAPSULE | Freq: Three times a day (TID) | ORAL | 0 refills | Status: DC | PRN

## 2022-12-03 MED ORDER — HYOSCYAMINE SULFATE 0.125 MG PO TABS: 0.1250 mg | ORAL_TABLET | Freq: Four times a day (QID) | ORAL | 3 refills | Status: DC | PRN

## 2022-12-03 NOTE — Telephone Encounter (Signed)
Since Bentyl was not efficacious and rifaximin is cost prohibitive, plan for the following: - Levsin 0.125 mg as needed every 6 hours for abdominal cramping, abdominal pain, abdominal spasm.  #30, RF 3 - Trial OTC IBgard - Low FODMAP diet

## 2022-12-03 NOTE — Telephone Encounter (Signed)
Called and spoke with patient regarding recommendations. Pt has been advised that I sent a low FODMAP diet to her MyChart for review. Patient has been advised to pick up IB GARD OTC along with RX for Levsin. Patient wanted to be added to cancellation list for sooner appt if possible. No sooner appts at this time. Pt verbalized understanding and had no concerns at the end of the call.   Levsin RX sent to [facility] in [place] per pt request.

## 2022-12-03 NOTE — Telephone Encounter (Signed)
Called and spoke with patient. I offered patient an appt with an APP tomorrow, but patient has to work. Patient reports intermittent episodes of IBS. Pt states that the episodes are random and she has not noticed any correlations. Patient reports that she will have about 4-5 watery stools, sharp abdominal cramping, and vomiting. Pt reports that the last episode was about 2 weeks ago. Abdominal cramping is not resolved after a bowel movement, it continues. She saw her PCP and they prescribed Rifaximin which patient was not able to get b/c it costs $2,700 with insurance. Patient has tried Bentyl in the past with no relief. Pt currently take 1 gummy probiotic every 2-3 days. Denies any fiber supplements. Pt has not tried any OTC medications. Patient states that she currently has formed stool, but she never knows when she is going to have an episode of IBS. Patient reports that she had GERD prior to IBS symptoms and she will take Pepto Bismol and Tums to get symptoms under control. Pt not currently experiencing GERD symptoms. Seeking advice until her appt in July.

## 2022-12-04 ENCOUNTER — Other Ambulatory Visit: Payer: Self-pay

## 2022-12-04 MED ORDER — DICYCLOMINE HCL 10 MG PO CAPS: 10.0000 mg | ORAL_CAPSULE | Freq: Four times a day (QID) | ORAL | 3 refills | Status: DC | PRN

## 2022-12-04 MED ORDER — BORIC ACID VAGINAL 600 MG VA SUPP: 1.0000 | Freq: Every day | VAGINAL | 0 refills | Status: DC

## 2023-02-16 ENCOUNTER — Ambulatory Visit (INDEPENDENT_AMBULATORY_CARE_PROVIDER_SITE_OTHER): Payer: No Typology Code available for payment source | Admitting: Gastroenterology

## 2023-02-16 ENCOUNTER — Encounter: Payer: Self-pay | Admitting: Gastroenterology

## 2023-02-16 ENCOUNTER — Ambulatory Visit: Payer: No Typology Code available for payment source | Admitting: Gastroenterology

## 2023-02-16 ENCOUNTER — Other Ambulatory Visit (INDEPENDENT_AMBULATORY_CARE_PROVIDER_SITE_OTHER): Payer: No Typology Code available for payment source

## 2023-02-16 VITALS — BP 98/60 | HR 55 | Ht 63.0 in | Wt 172.0 lb

## 2023-02-16 DIAGNOSIS — R140 Abdominal distension (gaseous): Secondary | ICD-10-CM

## 2023-02-16 DIAGNOSIS — K580 Irritable bowel syndrome with diarrhea: Secondary | ICD-10-CM

## 2023-02-16 DIAGNOSIS — R110 Nausea: Secondary | ICD-10-CM

## 2023-02-16 DIAGNOSIS — R1084 Generalized abdominal pain: Secondary | ICD-10-CM

## 2023-02-16 LAB — C-REACTIVE PROTEIN: CRP: <1 mg/dL (ref 0.5–20.0)

## 2023-02-16 LAB — SEDIMENTATION RATE: Sed Rate: 25 mm/hr — ABNORMAL HIGH (ref 0–20)

## 2023-02-16 NOTE — Progress Notes (Signed)
Chief Complaint:    IBS-D, nausea/vomiting  GI History: 37 year old female initially seen in the GI clinic on 10/08/2021 for evaluation of nausea/vomiting, generalized abdominal pain, diarrhea.  Symptoms started in the fall 2022. - 09/08/2021: ER evaluation for GI symptoms.  Normal CBC, CMP, lipase.  CT A/P with wall thickening and ascending/transverse colon and possible recent rupture of right ovarian follicle, fatty liver infiltration.  Treated with Augmentin - 10/08/2021: Initial GI appointment.  Symptoms improving but not completely resolved after course of Augmentin.  Still with abdominal pain, diarrhea and nausea, but emesis resolved. - 10/14/2021: Colonoscopy: Normal colon, biopsies negative for MC.  Normal TI.  Repeat age 22 for routine CRC screening - 02/10/2022: Negative/normal GI PCR panel - 03/04/2022: Normal CBC.  TSH <0.005, elevated T4 and T3 - 03/12/2022 TSH <0.005, elevated T4 - 03/20/2022: Follow-up in the GI clinic.  Ongoing generalized abdominal pain, nausea, intermittent loose stools a few times per month.  Started low FODMAP diet, Bentyl.  Normal celiac panel - 03/29/2022: CT A/P: Normal with resolution of previous inflammation in ascending/transverse colon - 10/01/2022: TSH 2.19, T41.08  HPI:     Patient is a 37 y.o. female presenting to the Gastroenterology Clinic for follow-up.  Was last seen by me on 03/20/2022.  Was still having nausea, generalized abdominal pain, intermittent loose stools, typically occurring 4 times/month, with symptoms lasting<24 hours.  Recommended low FODMAP diet, Bentyl, and close follow-up with PCM for hyperthyroidism.  In the interim, was seen by her PCM for these symptoms in 11/2022.  Prescribed rifaximin, but that was cost prohibitive.  Reports no improvement with trial of Bentyl.  Not much change with probiotic gummy.  Was given course of Levsin in April along with OTC IBgard and recommended low FODMAP diet again.  Today, she states she had sharp  pains, nausea/vomiting, diarrhea last week. Similar to sxs in the past.   Did low FODMAP diet for 2 weeks, but stopped since she wasn't having sxs. Never tried IBGuard. Reports Levsin didn't improve sxs at all, so stopped taking this as well.   Of note, she arrived 20+ minutes late for this appointment.   Review of systems:     No chest pain, no SOB, no fevers, no urinary sx   Past Medical History:  Diagnosis Date   Abdominal pain 10/08/2021   Bartholin cyst    Chlamydia    Chlamydia 12/29/2011   Azithromycin given 12/29/11, vomited. Partner Tx per pt. Rx Doxy 01/29/12.    Diarrhea 10/08/2021   Headache    Infection    UTI   Irregular periods/menstrual cycles    PONV (postoperative nausea and vomiting)     Patient's surgical history, family medical history, social history, medications and allergies were all reviewed in Epic    Current Outpatient Medications  Medication Sig Dispense Refill   Multiple Vitamin (MULTIVITAMIN) tablet Take 1 tablet by mouth daily.     Boric Acid Vaginal 600 MG SUPP Place 1 suppository vaginally at bedtime. (Patient not taking: Reported on 02/16/2023) 30 suppository 0   dicyclomine (BENTYL) 10 MG capsule Take 1 capsule (10 mg total) by mouth every 6 (six) hours as needed for spasms. (Patient not taking: Reported on 02/16/2023) 30 capsule 3   hyoscyamine (LEVSIN) 0.125 MG tablet Take 1 tablet (0.125 mg total) by mouth every 6 (six) hours as needed (abdominal crampinng, abdominal pain, abdominal spasm). (Patient not taking: Reported on 02/16/2023) 30 tablet 3   nitrofurantoin, macrocrystal-monohydrate, (MACROBID) 100 MG capsule Take  1 capsule (100 mg total) by mouth 2 (two) times daily. (Patient not taking: Reported on 02/16/2023) 10 capsule 0   Peppermint Oil (IBGARD) 90 MG CPCR Take 2 capsules by mouth 3 (three) times daily as needed. (Patient not taking: Reported on 02/16/2023) 60 capsule 0   rifaximin (XIFAXAN) 550 MG TABS tablet Take 1 tablet (550 mg total) by mouth  3 (three) times daily. (Patient not taking: Reported on 02/16/2023) 42 tablet 0   No current facility-administered medications for this visit.    Physical Exam:     BP 98/60   Pulse (!) 55   Ht 5\' 3"  (1.6 m)   Wt 172 lb (78 kg)   BMI 30.47 kg/m   GENERAL:  Pleasant female in NAD PSYCH: : Cooperative, normal affect NEURO: Alert and oriented x 3, no focal neurologic deficits   IMPRESSION and PLAN:    1) IBS-D 2) Nausea/Vomiting 3) Generalized abdominal pain 4) Abdominal bloating  Again discussed IBS-D with patient today.  Only trialed low FODMAP diet for 2 weeks, and interestingly she was without symptoms when she was on the low FODMAP diet.  Not sure if that was routine absence of symptoms since they tend to occur intermittently, or if the diet was efficacious.  Nonetheless, plan for the following: - Trial course of Fodzyme OTC - Again encouraged her to restart low FODMAP diet and resume for 4-6 weeks.  If symptoms overall improved, can start reintroducing food groups back into her diet and try to systematically identify potential food triggers - Per patient request, will check ESR/CRP to ensure no active inflammation - Previous CT in 03/2022 confirmed resolution of colonic inflammation and otherwise no acute intra-abdominal pathology - If ongoing symptoms, could consider trial of cholestyramine or Viberzi - Patient reports no improvement with trial of Bentyl and Levsin previously   RTC in 6 months or sooner prn            Verlin Dike Nyeli Holtmeyer ,DO, FACG 02/16/2023, 10:59 AM

## 2023-02-16 NOTE — Patient Instructions (Addendum)
Your provider has requested that you go to the basement level for lab work before leaving today. Press "B" on the elevator. The lab is located at the first door on the left as you exit the elevator.   Take FODZYME over the counter.  Please follow up in 6 months. Give [place] a call at [phone] to schedule an appointment.   Low-FODMAP Eating Plan  FODMAP stands for fermentable oligosaccharides, disaccharides, monosaccharides, and polyols. These are sugars that are hard for some people to digest. A low-FODMAP eating plan may help some people who have irritable bowel syndrome (IBS) and certain other bowel (intestinal) diseases to manage their symptoms. This meal plan can be complicated to follow. Work with a diet and nutrition specialist (dietitian) to make a low-FODMAP eating plan that is right for you. A dietitian can help make sure that you get enough nutrition from this diet. What are tips for following this plan? Reading food labels Check labels for hidden FODMAPs such as: High-fructose syrup. Honey. Agave. Natural fruit flavors. Onion or garlic powder. Choose low-FODMAP foods that contain 3-4 grams of fiber per serving. Check food labels for serving sizes. Eat only one serving at a time to make sure FODMAP levels stay low. Shopping Shop with a list of foods that are recommended on this diet and make a meal plan. Meal planning Follow a low-FODMAP eating plan for up to 6 weeks, or as told by your health care provider or dietitian. To follow the eating plan: Eliminate high-FODMAP foods from your diet completely. Choose only low-FODMAP foods to eat. You will do this for 2-6 weeks. Gradually reintroduce high-FODMAP foods into your diet one at a time. Most people should wait a few days before introducing the next new high-FODMAP food into their meal plan. Your dietitian can recommend how quickly you may reintroduce foods. Keep a daily record of what and how much you eat and drink. Make note of  any symptoms that you have after eating. Review your daily record with a dietitian regularly to identify which foods you can eat and which foods you should avoid. General tips Drink enough fluid each day to keep your urine pale yellow. Avoid processed foods. These often have added sugar and may be high in FODMAPs. Avoid most dairy products, whole grains, and sweeteners. Work with a dietitian to make sure you get enough fiber in your diet. Avoid high FODMAP foods at meals to manage symptoms. Recommended foods Fruits Bananas, oranges, tangerines, lemons, limes, blueberries, raspberries, strawberries, grapes, cantaloupe, honeydew melon, kiwi, papaya, passion fruit, and pineapple. Limited amounts of dried cranberries, banana chips, and shredded coconut. Vegetables Eggplant, zucchini, cucumber, peppers, green beans, bean sprouts, lettuce, arugula, kale, Swiss chard, spinach, collard greens, bok choy, summer squash, potato, and tomato. Limited amounts of corn, carrot, and sweet potato. Green parts of scallions. Grains Gluten-free grains, such as rice, oats, buckwheat, quinoa, corn, polenta, and millet. Gluten-free pasta, bread, or cereal. Rice noodles. Corn tortillas. Meats and other proteins Unseasoned beef, pork, poultry, or fish. Eggs. Tomasa Blase. Tofu (firm) and tempeh. Limited amounts of nuts and seeds, such as almonds, walnuts, [place] nuts, pecans, peanuts, nut butters, pumpkin seeds, chia seeds, and sunflower seeds. Dairy Lactose-free milk, yogurt, and kefir. Lactose-free cottage cheese and ice cream. Non-dairy milks, such as almond, coconut, hemp, and rice milk. Non-dairy yogurt. Limited amounts of goat cheese, brie, mozzarella, parmesan, swiss, and other hard cheeses. Fats and oils Butter-free spreads. Vegetable oils, such as olive, canola, and sunflower oil. Seasoning and  other foods Artificial sweeteners with names that do not end in "ol," such as aspartame, saccharine, and stevia. Maple syrup,  white table sugar, raw sugar, brown sugar, and molasses. Mayonnaise, soy sauce, and tamari. Fresh basil, coriander, parsley, rosemary, and thyme. Beverages Water and mineral water. Sugar-sweetened soft drinks. Small amounts of orange juice or cranberry juice. Black and green tea. Most dry wines. Coffee. The items listed above may not be a complete list of foods and beverages you can eat. Contact a dietitian for more information. Foods to avoid Fruits Fresh, dried, and juiced forms of apple, pear, watermelon, peach, plum, cherries, apricots, blackberries, boysenberries, figs, nectarines, and mango. Avocado. Vegetables Chicory root, artichoke, asparagus, cabbage, snow peas, Brussels sprouts, broccoli, sugar snap peas, mushrooms, celery, and cauliflower. Onions, garlic, leeks, and the white part of scallions. Grains Wheat, including kamut, durum, and semolina. Barley and bulgur. Couscous. Wheat-based cereals. Wheat noodles, bread, crackers, and pastries. Meats and other proteins Fried or fatty meat. Sausage. Cashews and pistachios. Soybeans, baked beans, black beans, chickpeas, kidney beans, fava beans, navy beans, lentils, black-eyed peas, and split peas. Dairy Milk, yogurt, ice cream, and soft cheese. Cream and sour cream. Milk-based sauces. Custard. Buttermilk. Soy milk. Seasoning and other foods Any sugar-free gum or candy. Foods that contain artificial sweeteners such as sorbitol, mannitol, isomalt, or xylitol. Foods that contain honey, high-fructose corn syrup, or agave. Bouillon, vegetable stock, beef stock, and chicken stock. Garlic and onion powder. Condiments made with onion, such as hummus, chutney, pickles, relish, salad dressing, and salsa. Tomato paste. Beverages Chicory-based drinks. Coffee substitutes. Chamomile tea. Fennel tea. Sweet or fortified wines such as port or sherry. Diet soft drinks made with isomalt, mannitol, maltitol, sorbitol, or xylitol. Apple, pear, and mango juice.  Juices with high-fructose corn syrup. The items listed above may not be a complete list of foods and beverages you should avoid. Contact a dietitian for more information. Summary FODMAP stands for fermentable oligosaccharides, disaccharides, monosaccharides, and polyols. These are sugars that are hard for some people to digest. A low-FODMAP eating plan is a short-term diet that helps to ease symptoms of certain bowel diseases. The eating plan usually lasts up to 6 weeks. After that, high-FODMAP foods are reintroduced gradually and one at a time. This can help you find out which foods may be causing symptoms. A low-FODMAP eating plan can be complicated. It is best to work with a dietitian who has experience with this type of plan. This information is not intended to replace advice given to you by your health care provider. Make sure you discuss any questions you have with your health care provider. Document Revised: 12/21/2019 Document Reviewed: 12/21/2019 Elsevier Patient Education  2024 [employer]  _______________________________________________________  If your blood pressure at your visit was 140/90 or greater, please contact your primary care physician to follow up on this.  _______________________________________________________  If you are age 65 or younger, your body mass index should be between 19-25. Your Body mass index is 30.47 kg/m. If this is out of the aformentioned range listed, please consider follow up with your Primary Care Provider.   __________________________________________________________  The [facility] providers would like to encourage you to use [facility] to communicate with providers for non-urgent requests or questions.  Due to long hold times on the telephone, sending your provider a message by [facility] may be a faster and more efficient way to get a response.  Please allow 48 business hours for a response.  Please remember that this is  for non-urgent requests.    Due to recent changes in healthcare laws, you may see the results of your imaging and laboratory studies on MyChart before your provider has had a chance to review them.  We understand that in some cases there may be results that are confusing or concerning to you. Not all laboratory results come back in the same time frame and the provider may be waiting for multiple results in order to interpret others.  Please give [place] 48 hours in order for your provider to thoroughly review all the results before contacting the office for clarification of your results.    Thank you for choosing me and [facility].  Vito Cirigliano, D.O.

## 2023-03-26 ENCOUNTER — Ambulatory Visit: Payer: No Typology Code available for payment source | Admitting: Nurse Practitioner

## 2023-03-26 ENCOUNTER — Encounter: Payer: Self-pay | Admitting: Nurse Practitioner

## 2023-03-26 VITALS — BP 120/78 | HR 71 | Ht 63.0 in | Wt 175.8 lb

## 2023-03-26 DIAGNOSIS — E0500 Thyrotoxicosis with diffuse goiter without thyrotoxic crisis or storm: Secondary | ICD-10-CM

## 2023-03-26 DIAGNOSIS — R350 Frequency of micturition: Secondary | ICD-10-CM | POA: Diagnosis not present

## 2023-03-26 DIAGNOSIS — E0590 Thyrotoxicosis, unspecified without thyrotoxic crisis or storm: Secondary | ICD-10-CM

## 2023-03-26 DIAGNOSIS — Z0000 Encounter for general adult medical examination without abnormal findings: Secondary | ICD-10-CM

## 2023-03-26 DIAGNOSIS — R079 Chest pain, unspecified: Secondary | ICD-10-CM | POA: Diagnosis not present

## 2023-03-26 DIAGNOSIS — R1084 Generalized abdominal pain: Secondary | ICD-10-CM

## 2023-03-26 DIAGNOSIS — Z113 Encounter for screening for infections with a predominantly sexual mode of transmission: Secondary | ICD-10-CM | POA: Diagnosis not present

## 2023-03-26 LAB — HEMOGLOBIN A1C
Est. average glucose Bld gHb Est-mCnc: 111 mg/dL
Hgb A1c MFr Bld: 5.5 % (ref 4.8–5.6)

## 2023-03-26 LAB — CMP14+EGFR

## 2023-03-26 LAB — TSH

## 2023-03-26 LAB — CBC WITH DIFFERENTIAL/PLATELET
Basophils Absolute: 0 10*3/uL (ref 0.0–0.2)
Basos: 0 %
EOS (ABSOLUTE): 0.2 10*3/uL (ref 0.0–0.4)
Eos: 2 %
Hematocrit: 39.7 % (ref 34.0–46.6)
Hemoglobin: 13.2 g/dL (ref 11.1–15.9)
Immature Grans (Abs): 0 10*3/uL (ref 0.0–0.1)
Immature Granulocytes: 0 %
Lymphocytes Absolute: 2.9 10*3/uL (ref 0.7–3.1)
Lymphs: 34 %
MCH: 30.1 pg (ref 26.6–33.0)
MCHC: 33.2 g/dL (ref 31.5–35.7)
MCV: 90 fL (ref 79–97)
Monocytes Absolute: 0.6 10*3/uL (ref 0.1–0.9)
Monocytes: 8 %
Neutrophils Absolute: 4.6 10*3/uL (ref 1.4–7.0)
Neutrophils: 56 %
Platelets: 315 10*3/uL (ref 150–450)
RBC: 4.39 x10E6/uL (ref 3.77–5.28)
RDW: 11.8 % (ref 11.7–15.4)
WBC: 8.3 10*3/uL (ref 3.4–10.8)

## 2023-03-26 LAB — RPR

## 2023-03-26 LAB — HIV ANTIBODY (ROUTINE TESTING W REFLEX)

## 2023-03-26 LAB — LIPID PANEL

## 2023-03-26 LAB — POCT URINALYSIS DIP (CLINITEK)
Bilirubin, UA: NEGATIVE
Blood, UA: NEGATIVE
Glucose, UA: NEGATIVE mg/dL
Leukocytes, UA: NEGATIVE
Nitrite, UA: NEGATIVE
POC PROTEIN,UA: NEGATIVE
Spec Grav, UA: 1.02 (ref 1.010–1.025)
Urobilinogen, UA: 0.2 E.U./dL
pH, UA: 6 (ref 5.0–8.0)

## 2023-03-26 NOTE — Progress Notes (Signed)
Joyce Clamp, DNP, AGNP-c [facility] [address] [place], [place] [address] Main Office [phone]  BP 120/78   Pulse 71   Ht 5\' 3"  (1.6 m)   Wt 175 lb 12.8 oz (79.7 kg)   LMP 03/16/2023   BMI 31.14 kg/m    Subjective:    Patient ID: Joyce Rice, female    DOB: 21-Feb-1986, 37 y.o.   MRN: [id number]  HPI: Joyce Rice is a 37 y.o. female presenting on 03/26/2023 for comprehensive medical examination.   Current medical concerns include: Chest pains Joyce Rice tells me that she has been having chest pains on and off for a while. Initially she thought this was GI related, but does not seem to correlate with her GI occurances. She tells me the pain will come up suddenly in the middle of the chest with radiation sometimes in her back and her left arm. She tells me this can occur at any time, at rest and with movement. She tells me that her symptoms will last about 15 minutes until she gets an aspirin then the symptoms resolve spontaneously. She tells me during these times her heart rate will drop into the 50's per her pulse ox, but her oxygen is stable at the time. She is not sure what her blood pressure is doing. She denies dizziness, jaw pain, shortness of breath, sweating, nausea, or heaviness. She describes the pain as shooting in nature.   GI symptoms She tells me that she has seen the GI Dr for her abdominal pain, diarrhea, severe cramping, nausea, vomiting, sweating, feeling like she is going to pass out, and feeling overheated. These symptoms can be as often as once a week. She tells me she will end up having vomiting and diarrhea at the same time. She has been taking the medication and has had no changes in her symptoms.   Pertinent items are noted in HPI.  IMMUNIZATIONS:   Flu: Flu vaccine postponed until flu season Prevnar 13: Prevnar 13 N/A for this patient Prevnar 20: Prevnar 20 N/A for this patient Pneumovax 23: Pneumovax 23 N/A for this  patient Vac Shingrix: Shingrix N/A for this patient HPV: HPV N/A for this patient Tetanus: Thinks this has been done COVID: COVID declined, patient will complete at a later date  HEALTH MAINTENANCE: Pap Smear HM Status: is up to date Mammogram HM Status: is not applicable for this patient Colon Cancer Screening HM Status: is up to date Bone Density HM Status: is not applicable for this patient STI Testing HM Status: is up to date Lung CT HM Status: is not applicable for this patient  She tells me she is working on a healthy diet and monitoring closely for her GI issues.   She tells me that she is having frequent urination.   Most Recent Depression Screen:     03/26/2023   10:35 AM 10/01/2022    3:12 PM 02/10/2022    3:26 PM 01/24/2021   11:11 AM 06/08/2017   10:23 AM  Depression screen PHQ 2/9  Decreased Interest 0 0 0 0 0  Down, Depressed, Hopeless 0 0 0 0 0  PHQ - 2 Score 0 0 0 0 0  Altered sleeping     0  Tired, decreased energy     0  Change in appetite     0  Feeling bad or failure about yourself      0  Trouble concentrating     0  Moving slowly or fidgety/restless  0  Suicidal thoughts     0  PHQ-9 Score     0   Most Recent Anxiety Screen:     06/08/2017   10:23 AM 05/11/2016    2:38 PM 02/26/2016    3:15 PM  GAD 7 : Generalized Anxiety Score  Nervous, Anxious, on Edge 0 0 0  Control/stop worrying 0 0 0  Worry too much - different things 0 0 0  Trouble relaxing 0 0 2  Restless 0 0 0  Easily annoyed or irritable 0 0 0  Afraid - awful might happen 0 0 0  Total GAD 7 Score 0 0 2   Most Recent Fall Screen:    03/26/2023   10:35 AM 10/01/2022    3:12 PM 02/10/2022    3:26 PM 01/24/2021   11:11 AM  Fall Risk   Falls in the past year? 0 0 1 1  Number falls in past yr: 0 0 0 0  Injury with Fall? 0 0 1 1  Comment   Pt broke her ankle   Risk for fall due to : No Fall Risks No Fall Risks No Fall Risks Other (Comment)  Follow up Falls evaluation completed Falls  evaluation completed Falls evaluation completed Falls evaluation completed    Past medical history, surgical history, medications, allergies, family history and social history reviewed with patient today and changes made to appropriate areas of the chart.  Past Medical History:  Past Medical History:  Diagnosis Date   Abdominal pain 10/08/2021   Bartholin cyst    Chlamydia    Chlamydia 12/29/2011   Azithromycin given 12/29/11, vomited. Partner Tx per pt. Rx Doxy 01/29/12.    Diarrhea 10/08/2021   Headache    Infection    UTI   Irregular periods/menstrual cycles    PONV (postoperative nausea and vomiting)    Medications:  Current Outpatient Medications on File Prior to Visit  Medication Sig   Multiple Vitamin (MULTIVITAMIN) tablet Take 1 tablet by mouth daily.   No current facility-administered medications on file prior to visit.   Surgical History:  Past Surgical History:  Procedure Laterality Date   COLONOSCOPY  10/14/2021   DILATION AND CURETTAGE OF UTERUS     INDUCED ABORTION     LAPAROSCOPIC TUBAL LIGATION Bilateral 05/26/2016   Procedure: LAPAROSCOPIC TUBAL LIGATION;  Surgeon: Catalina Antigua, MD;  Location: [facility];  Service: Gynecology;  Laterality: Bilateral;   Allergies:  Allergies  Allergen Reactions   Monistat [Miconazole] Rash    irritation   Family History:  Family History  Problem Relation Age of Onset   Hearing loss Neg Hx    Colon cancer Neg Hx    Esophageal cancer Neg Hx        Objective:    BP 120/78   Pulse 71   Ht 5\' 3"  (1.6 m)   Wt 175 lb 12.8 oz (79.7 kg)   LMP 03/16/2023   BMI 31.14 kg/m   Wt Readings from Last 3 Encounters:  03/26/23 175 lb 12.8 oz (79.7 kg)  02/16/23 172 lb (78 kg)  11/24/22 175 lb 3.2 oz (79.5 kg)    Physical Exam Vitals and nursing note reviewed.  Constitutional:      General: She is not in acute distress.    Appearance: Normal appearance. She is not ill-appearing or toxic-appearing.  HENT:     Head:  Normocephalic.     Right Ear: Tympanic membrane normal.     Left Ear: Tympanic membrane normal.  Nose: Nose normal.     Mouth/Throat:     Mouth: Mucous membranes are moist.     Pharynx: Oropharynx is clear.  Eyes:     Conjunctiva/sclera: Conjunctivae normal.     Pupils: Pupils are equal, round, and reactive to light.  Neck:     Vascular: No carotid bruit.  Cardiovascular:     Rate and Rhythm: Normal rate and regular rhythm.     Pulses: Normal pulses.     Heart sounds: Normal heart sounds.  Pulmonary:     Effort: Pulmonary effort is normal.     Breath sounds: Normal breath sounds.  Abdominal:     General: Bowel sounds are normal. There is no distension.     Palpations: Abdomen is soft. There is no mass.     Tenderness: There is no abdominal tenderness. There is no right CVA tenderness, left CVA tenderness, guarding or rebound.  Musculoskeletal:     Cervical back: Normal range of motion. Rigidity present.     Right lower leg: No edema.     Left lower leg: No edema.  Skin:    General: Skin is warm and dry.     Capillary Refill: Capillary refill takes less than 2 seconds.  Neurological:     General: No focal deficit present.     Mental Status: She is alert and oriented to person, place, and time.     Sensory: No sensory deficit.     Motor: No weakness.     Coordination: Coordination normal.  Psychiatric:        Attention and Perception: Attention normal.        Mood and Affect: Mood normal. Affect is labile.        Speech: Speech normal.        Behavior: Behavior normal. Behavior is cooperative.        Cognition and Memory: Cognition normal.        Judgment: Judgment normal.     Results for orders placed or performed in visit on 03/26/23  CBC with Differential/Platelet  Result Value Ref Range   WBC 8.3 3.4 - 10.8 x10E3/uL   RBC 4.39 3.77 - 5.28 x10E6/uL   Hemoglobin 13.2 11.1 - 15.9 g/dL   Hematocrit 16.1 09.6 - 46.6 %   MCV 90 79 - 97 fL   MCH 30.1 26.6 - 33.0 pg    MCHC 33.2 31.5 - 35.7 g/dL   RDW 04.5 40.9 - 81.1 %   Platelets 315 150 - 450 x10E3/uL   Neutrophils 56 Not Estab. %   Lymphs 34 Not Estab. %   Monocytes 8 Not Estab. %   Eos 2 Not Estab. %   Basos 0 Not Estab. %   Neutrophils Absolute 4.6 1.4 - 7.0 x10E3/uL   Lymphocytes Absolute 2.9 0.7 - 3.1 x10E3/uL   Monocytes Absolute 0.6 0.1 - 0.9 x10E3/uL   EOS (ABSOLUTE) 0.2 0.0 - 0.4 x10E3/uL   Basophils Absolute 0.0 0.0 - 0.2 x10E3/uL   Immature Granulocytes 0 Not Estab. %   Immature Grans (Abs) 0.0 0.0 - 0.1 x10E3/uL  CMP14+EGFR  Result Value Ref Range   Glucose 97 70 - 99 mg/dL   BUN 11 6 - 20 mg/dL   Creatinine, Ser 9.14 0.57 - 1.00 mg/dL   eGFR 782 >95 AO/ZHY/8.65   BUN/Creatinine Ratio 14 9 - 23   Sodium 139 134 - 144 mmol/L   Potassium 4.2 3.5 - 5.2 mmol/L   Chloride 101 96 - 106 mmol/L  CO2 22 20 - 29 mmol/L   Calcium 9.9 8.7 - 10.2 mg/dL   Total Protein 7.6 6.0 - 8.5 g/dL   Albumin 4.5 3.9 - 4.9 g/dL   Globulin, Total 3.1 1.5 - 4.5 g/dL   Bilirubin Total 0.5 0.0 - 1.2 mg/dL   Alkaline Phosphatase 84 44 - 121 IU/L   AST 21 0 - 40 IU/L   ALT 17 0 - 32 IU/L  Hemoglobin A1c  Result Value Ref Range   Hgb A1c MFr Bld 5.5 4.8 - 5.6 %   Est. average glucose Bld gHb Est-mCnc 111 mg/dL  Lipid panel  Result Value Ref Range   Cholesterol, Total 251 (H) 100 - 199 mg/dL   Triglycerides 78 0 - 149 mg/dL   HDL 81 >16 mg/dL   VLDL Cholesterol Cal 13 5 - 40 mg/dL   LDL Chol Calc (NIH) 109 (H) 0 - 99 mg/dL   Chol/HDL Ratio 3.1 0.0 - 4.4 ratio  TSH  Result Value Ref Range   TSH 1.700 0.450 - 4.500 uIU/mL  RPR  Result Value Ref Range   RPR Ser Ql Non Reactive Non Reactive  HIV Antibody (routine testing w rflx)  Result Value Ref Range   HIV Screen 4th Generation wRfx Non Reactive Non Reactive  POCT URINALYSIS DIP (CLINITEK)  Result Value Ref Range   Color, UA yellow yellow   Clarity, UA clear clear   Glucose, UA negative negative mg/dL   Bilirubin, UA negative negative    Ketones, POC UA moderate (40) (A) negative mg/dL   Spec Grav, UA 6.045 4.098 - 1.025   Blood, UA negative negative   pH, UA 6.0 5.0 - 8.0   POC PROTEIN,UA negative negative, trace   Urobilinogen, UA 0.2 0.2 or 1.0 E.U./dL   Nitrite, UA Negative Negative   Leukocytes, UA Negative Negative  NuSwab VG+, HSV  Result Value Ref Range   Atopobium vaginae Low - 0 Score   BVAB 2 Low - 0 Score   Megasphaera 1 Low - 0 Score   Candida albicans, NAA Negative Negative   Candida glabrata, NAA Positive (A) Negative   Trich vag by NAA Negative Negative   Chlamydia trachomatis, NAA Negative Negative   Neisseria gonorrhoeae, NAA Negative Negative   HSV 1 NAA Negative Negative   HSV 2 NAA Negative Negative         Assessment & Plan:   Problem List Items Addressed This Visit     Abdominal pain    Ongoing GI symptoms with episodes of nausea, vomiting, diarrhea, sweating, and severe abdominal pain. She is currently monitored with GI. She has tried bentyl, levsin, and probiotic with no changes in symptoms. She has not tried IB guard. At her last appt with GI, it was recommended she try Fodmap diet for extended period (at least 6 weeks) to see if this helps with resolution of the symptoms then slowly reintroduce foods one group at a time to determine if specific group is causing symptoms.  Plan: - Continue to trial the Fodmap diet and slow reintroduction of foods - Consider repeat evaluation with GI if symptoms persist.       Hyperthyroidism    Chronic and stable. No medications at this time. Labs pending.       Relevant Orders   TSH (Completed)   Encounter for annual physical exam - Primary    CPE completed today. Review of HM activities and recommendations discussed and provided on AVS. Anticipatory guidance, diet, and exercise  recommendations provided. Medications, allergies, and hx reviewed and updated as necessary. Orders placed as listed below.  Plan: - Labs ordered. Will make changes as  necessary based on results.  - I will review these results and send recommendations via MyChart or a telephone call.  - F/U with CPE in 1 year or sooner for acute/chronic health needs as directed.        Relevant Orders   CBC with Differential/Platelet (Completed)   CMP14+EGFR (Completed)   Hemoglobin A1c (Completed)   Lipid panel (Completed)   TSH (Completed)   Ambulatory referral to Cardiology   POCT URINALYSIS DIP (CLINITEK) (Completed)   RPR (Completed)   HIV Antibody (routine testing w rflx) (Completed)   Urinary frequency    UA negative. Will obtain vaginal swab for further evaluation. Will also monitor glucose and labs      Relevant Orders   CMP14+EGFR (Completed)   Hemoglobin A1c (Completed)   POCT URINALYSIS DIP (CLINITEK) (Completed)   Chest pain at rest    Intermittent periods of chest pain at rest with unknown etiology. Patient reports bradycardia during episodes with stable O2. We will obtain labs today to ensure that there are no findings that could explain her symptoms. At this time she is not having any symptoms.  Plan: Will send referral to cardiology for further evaluation and recommendations. Suspect a cardiac monitor and echo will be needed.  If symptoms return, please seek emergency evaluation.       Relevant Orders   Ambulatory referral to Cardiology   Graves disease   Relevant Orders   TSH (Completed)       Follow up plan: No follow-ups on file.  NEXT PREVENTATIVE PHYSICAL DUE IN 1 YEAR.  PATIENT COUNSELING PROVIDED FOR ALL ADULT PATIENTS: A well balanced diet low in saturated fats, cholesterol, and moderation in carbohydrates.  This can be as simple as monitoring portion sizes and cutting back on sugary beverages such as soda and juice to start with.    Daily water consumption of at least 64 ounces.  Physical activity at least 180 minutes per week.  If just starting out, start 10 minutes a day and work your way up.   This can be as simple  as taking the stairs instead of the elevator and walking 2-3 laps around the office  purposefully every day.   STD protection, partner selection, and regular testing if high risk.  Limited consumption of alcoholic beverages if alcohol is consumed. For men, I recommend no more than 14 alcoholic beverages per week, spread out throughout the week (max 2 per day). Avoid "binge" drinking or consuming large quantities of alcohol in one setting.  Please let me know if you feel you may need help with reduction or quitting alcohol consumption.   Avoidance of nicotine, if used. Please let me know if you feel you may need help with reduction or quitting nicotine use.   Daily mental health attention. This can be in the form of 5 minute daily meditation, prayer, journaling, yoga, reflection, etc.  Purposeful attention to your emotions and mental state can significantly improve your overall wellbeing  and  Health.  Please know that I am here to help you with all of your health care goals and am happy to work with you to find a solution that works best for you.  The greatest advice I have received with any changes in life are to take it one step at a time, that even means if all you  can focus on is the next 60 seconds, then do that and celebrate your victories.  With any changes in life, you will have set backs, and that is OK. The important thing to remember is, if you have a set back, it is not a failure, it is an opportunity to try again! Screening Testing Mammogram Every 1 -2 years based on history and risk factors Starting at age 76 Pap Smear Ages 21-39 every 3 years Ages 39-65 every 5 years with HPV testing More frequent testing may be required based on results and history Colon Cancer Screening Every 1-10 years based on test performed, risk factors, and history Starting at age 85 Bone Density Screening Every 2-10 years based on history Starting at age 59 for women Recommendations for men  differ based on medication usage, history, and risk factors AAA Screening One time ultrasound Men 18-58 years old who have every smoked Lung Cancer Screening Low Dose Lung CT every 12 months Age 24-80 years with a 30 pack-year smoking history who still smoke or who have quit within the last 15 years   Screening Labs Routine  Labs: Complete Blood Count (CBC), Complete Metabolic Panel (CMP), Cholesterol (Lipid Panel) Every 6-12 months based on history and medications May be recommended more frequently based on current conditions or previous results Hemoglobin A1c Lab Every 3-12 months based on history and previous results Starting at age 8 or earlier with diagnosis of diabetes, high cholesterol, BMI >26, and/or risk factors Frequent monitoring for patients with diabetes to ensure blood sugar control Thyroid Panel (TSH) Every 6 months based on history, symptoms, and risk factors May be repeated more often if on medication HIV One time testing for all patients 100 and older May be repeated more frequently for patients with increased risk factors or exposure Hepatitis C One time testing for all patients 82 and older May be repeated more frequently for patients with increased risk factors or exposure Gonorrhea, Chlamydia Every 12 months for all sexually active persons 13-24 years Additional monitoring may be recommended for those who are considered high risk or who have symptoms Every 12 months for any woman on birth control, regardless of sexual activity PSA Men 17-30 years old with risk factors Additional screening may be recommended from age 50-69 based on risk factors, symptoms, and history  Vaccine Recommendations Tetanus Booster All adults every 10 years Flu Vaccine All patients 6 months and older every year COVID Vaccine All patients 12 years and older Initial dosing with booster May recommend additional booster based on age and health history HPV Vaccine 2 doses all  patients age 47-26 Dosing may be considered for patients over 26 Shingles Vaccine (Shingrix) 2 doses all adults 55 years and older Pneumonia (Pneumovax 55) All adults 65 years and older May recommend earlier dosing based on health history One year apart from Prevnar 60 Pneumonia (Prevnar 99) All adults 65 years and older Dosed 1 year after Pneumovax 23 Pneumonia (Prevnar 20) One time alternative to the two dosing of 13 and 23 For all adults with initial dose of 23, 20 is recommended 1 year later For all adults with initial dose of 13, 23 is still recommended as second option 1 year later

## 2023-04-08 ENCOUNTER — Telehealth: Payer: Self-pay | Admitting: Nurse Practitioner

## 2023-04-08 DIAGNOSIS — B379 Candidiasis, unspecified: Secondary | ICD-10-CM

## 2023-04-08 NOTE — Telephone Encounter (Signed)
Pt got your message & called back & said wants the oral medication because she can't do the creams,  please send into [facility] & she will see if it's affordable.

## 2023-04-09 MED ORDER — VORICONAZOLE 200 MG PO TABS: 200.0000 mg | ORAL_TABLET | Freq: Two times a day (BID) | ORAL | 0 refills | Status: DC

## 2023-04-10 NOTE — Assessment & Plan Note (Signed)
Intermittent periods of chest pain at rest with unknown etiology. Patient reports bradycardia during episodes with stable O2. We will obtain labs today to ensure that there are no findings that could explain her symptoms. At this time she is not having any symptoms.  Plan: Will send referral to cardiology for further evaluation and recommendations. Suspect a cardiac monitor and echo will be needed.  If symptoms return, please seek emergency evaluation.

## 2023-04-10 NOTE — Assessment & Plan Note (Signed)
Ongoing GI symptoms with episodes of nausea, vomiting, diarrhea, sweating, and severe abdominal pain. She is currently monitored with GI. She has tried bentyl, levsin, and probiotic with no changes in symptoms. She has not tried IB guard. At her last appt with GI, it was recommended she try Fodmap diet for extended period (at least 6 weeks) to see if this helps with resolution of the symptoms then slowly reintroduce foods one group at a time to determine if specific group is causing symptoms.  Plan: - Continue to trial the Fodmap diet and slow reintroduction of foods - Consider repeat evaluation with GI if symptoms persist.

## 2023-04-10 NOTE — Assessment & Plan Note (Signed)

## 2023-04-10 NOTE — Assessment & Plan Note (Signed)
Chronic and stable. No medications at this time. Labs pending.

## 2023-04-10 NOTE — Assessment & Plan Note (Signed)
UA negative. Will obtain vaginal swab for further evaluation. Will also monitor glucose and labs

## 2023-05-27 ENCOUNTER — Encounter: Payer: Self-pay | Admitting: Cardiology

## 2023-05-27 ENCOUNTER — Other Ambulatory Visit: Payer: Self-pay

## 2023-05-27 ENCOUNTER — Ambulatory Visit: Payer: No Typology Code available for payment source | Attending: Cardiology | Admitting: Cardiology

## 2023-05-27 VITALS — BP 110/76 | HR 59 | Ht 63.0 in | Wt 177.2 lb

## 2023-05-27 DIAGNOSIS — R072 Precordial pain: Secondary | ICD-10-CM

## 2023-05-27 DIAGNOSIS — E7800 Pure hypercholesterolemia, unspecified: Secondary | ICD-10-CM

## 2023-05-27 MED ORDER — METOPROLOL TARTRATE 25 MG PO TABS: 25.0000 mg | ORAL_TABLET | Freq: Once | ORAL | 0 refills | Status: DC

## 2023-05-27 NOTE — Patient Instructions (Signed)
Medication Instructions:   Your physician recommends that you continue on your current medications as directed. Please refer to the Current Medication list given to you today.  *If you need a refill on your cardiac medications before your next appointment, please call your pharmacy*   Lab Work:  Your physician recommends you have labs - BMP  If you have labs (blood work) drawn today and your tests are completely normal, you will receive your results only by: MyChart Message (if you have MyChart) OR A paper copy in the mail If you have any lab test that is abnormal or we need to change your treatment, we will call you to review the results.   Testing/Procedures:  Your physician has requested that you have an echocardiogram. Echocardiography is a painless test that uses sound waves to create images of your heart. It provides your doctor with information about the size and shape of your heart and how well your heart's chambers and valves are working. This procedure takes approximately one hour. There are no restrictions for this procedure. Please do NOT wear cologne, perfume, aftershave, or lotions (deodorant is allowed). Please arrive 15 minutes prior to your appointment time.    Your cardiac CT will be scheduled at one of the below locations:   [facility] [address] [place], [place] [address] [phone]  OR  [facility] [address] [place], [place] [address] [phone]  OR   [facility] Heart and Vascular entrance [address] [place], [place] [address] [phone]  Please follow these instructions carefully (unless otherwise directed):  An IV will be required for this test and Nitroglycerin will be given.  Hold all erectile dysfunction medications at least 3 days (72 hrs) prior to test. (Ie viagra, cialis, sildenafil, tadalafil, etc)   On the Night Before the Test: Be sure to Drink plenty of  water. Do not consume any caffeinated/decaffeinated beverages or chocolate 12 hours prior to your test. Do not take any antihistamines 12 hours prior to your test. If the patient has contrast allergy:  On the Day of the Test: Drink plenty of water until 1 hour prior to the test. Do not eat any food 1 hour prior to test. You may take your regular medications prior to the test.  Take metoprolol (Lopressor) two hours prior to test. FEMALES- please wear underwire-free bra if available, avoid dresses & tight clothing      After the Test: Drink plenty of water. After receiving IV contrast, you may experience a mild flushed feeling. This is normal. On occasion, you may experience a mild rash up to 24 hours after the test. This is not dangerous. If this occurs, you can take Benadryl 25 mg and increase your fluid intake. If you experience trouble breathing, this can be serious. If it is severe call 911 IMMEDIATELY. If it is mild, please call our office. If you take any of these medications: Glipizide/Metformin, Avandament, Glucavance, please do not take 48 hours after completing test unless otherwise instructed.  We will call to schedule your test 2-4 weeks out understanding that some insurance companies will need an authorization prior to the service being performed.   For more information and frequently asked questions, please visit our website : [email or web address]  For non-scheduling related questions, please contact the cardiac imaging nurse navigator should you have any questions/concerns: Cardiac Imaging Nurse Navigators Direct Office Dial: [phone]   For scheduling needs, including cancellations and rescheduling, please call  [place], [phone].   Follow-Up: At [facility], you and your health needs are our priority.  As part of our continuing mission to provide you with exceptional heart care, we have created designated Provider Care Teams.  These Care  Teams include your primary Cardiologist (physician) and Advanced Practice Providers (APPs -  Physician Assistants and Nurse Practitioners) who all work together to provide you with the care you need, when you need it.  We recommend signing up for the patient portal called "MyChart".  Sign up information is provided on this After Visit Summary.  MyChart is used to connect with patients for Virtual Visits (Telemedicine).  Patients are able to view lab/test results, encounter notes, upcoming appointments, etc.  Non-urgent messages can be sent to your provider as well.   To learn more about what you can do with MyChart, go to [email or web address].    Your next appointment:    After Cardiac testing  Provider:   You may see Debbe Odea, MD or one of the following Advanced Practice Providers on your designated Care Team:   Nicolasa Ducking, NP Eula Listen, PA-C Cadence Fransico Michael, PA-C Charlsie Quest, NP

## 2023-05-27 NOTE — Progress Notes (Signed)
Cardiology Office Note:    Date:  05/27/2023   ID:  Joyce Rice, DOB February 03, 1986, MRN [id number]  PCP:  Tollie Eth, NP   [facility] Providers Cardiologist:  Debbe Odea, MD     Referring MD: Tollie Eth, NP   Chief Complaint  Patient presents with   New Patient (Initial Visit)    Referral for cardiac evaluation of chest pain at rest with no cardiac history.   Joyce Rice is a 37 y.o. female who is being seen today for the evaluation of chest pain at the request of Early, Sung Amabile, NP.  History of Present Illness:    Joyce Rice is a 37 y.o. female with a hx of hyperlipidemia, thyroid dysfunction presenting with chest pain.  States having chest pain on and off over the past year and a half.  Symptoms occur randomly not associated with exertion lasting a couple of minutes.  Has been dealing with abdominal issues including nausea, vomiting, abdominal pain for some time now.  Seeing the GI specialist, several testing/workup underway.  Recently diagnosed with hyperthyroidism, TSH resolved off any medications.  Denies smoking, denies any history of MI.  Past Medical History:  Diagnosis Date   Abdominal pain 10/08/2021   Bartholin cyst    Chlamydia    Chlamydia 12/29/2011   Azithromycin given 12/29/11, vomited. Partner Tx per pt. Rx Doxy 01/29/12.    Diarrhea 10/08/2021   Headache    Hypothyroid    Infection    UTI   Irregular periods/menstrual cycles    PONV (postoperative nausea and vomiting)     Past Surgical History:  Procedure Laterality Date   COLONOSCOPY  10/14/2021   DILATION AND CURETTAGE OF UTERUS     INDUCED ABORTION     LAPAROSCOPIC TUBAL LIGATION Bilateral 05/26/2016   Procedure: LAPAROSCOPIC TUBAL LIGATION;  Surgeon: Catalina Antigua, MD;  Location: [facility];  Service: Gynecology;  Laterality: Bilateral;    Current Medications: Current Meds  Medication Sig   metoprolol tartrate (LOPRESSOR) 25 MG tablet Take 1 tablet (25 mg total)  by mouth once for 1 dose. TWO HOUR PRIOR TO CARDIAC CT   Multiple Vitamin (MULTIVITAMIN) tablet Take 1 tablet by mouth daily.     Allergies:   Monistat [miconazole]   Social History   Socioeconomic History   Marital status: Single    Spouse name: Not on file   Number of children: Not on file   Years of education: Not on file   Highest education level: Not on file  Occupational History   Not on file  Tobacco Use   Smoking status: Never   Smokeless tobacco: Never  Vaping Use   Vaping status: Never Used  Substance and Sexual Activity   Alcohol use: Yes    Comment: once or twice a year   Drug use: No   Sexual activity: Not Currently    Birth control/protection: Surgical  Other Topics Concern   Not on file  Social History Narrative   Not on file   Social Determinants of Health   Financial Resource Strain: Low Risk  (03/26/2023)   Overall Financial Resource Strain (CARDIA)    Difficulty of Paying Living Expenses: Not hard at all  Food Insecurity: No Food Insecurity (03/26/2023)   Hunger Vital Sign    Worried About Running Out of Food in the Last Year: Never true    Ran Out of Food in the Last Year: Never true  Transportation Needs: No Transportation Needs (03/26/2023)  PRAPARE - Administrator, Civil Service (Medical): No    Lack of Transportation (Non-Medical): No  Physical Activity: Insufficiently Active (03/26/2023)   Exercise Vital Sign    Days of Exercise per Week: 1 day    Minutes of Exercise per Session: 30 min  Stress: No Stress Concern Present (03/26/2023)   [employer] of Occupational Health - Occupational Stress Questionnaire    Feeling of Stress : Not at all  Social Connections: Unknown (03/26/2023)   Social Connection and Isolation Panel [NHANES]    Frequency of Communication with Friends and Family: Three times a week    Frequency of Social Gatherings with Friends and Family: Three times a week    Attends Religious Services: 1 to 4 times per year     Active Member of Clubs or Organizations: No    Attends Banker Meetings: Never    Marital Status: Patient unable to answer     Family History: The patient's family history is negative for Hearing loss, Colon cancer, and Esophageal cancer.  ROS:   Please see the history of present illness.     All other systems reviewed and are negative.  EKGs/Labs/Other Studies Reviewed:    The following studies were reviewed today:  EKG Interpretation Date/Time:  Thursday May 27 2023 10:00:22 EDT Ventricular Rate:  59 PR Interval:  152 QRS Duration:  78 QT Interval:  436 QTC Calculation: 431 R Axis:   51  Text Interpretation: Sinus bradycardia with marked sinus arrhythmia Confirmed by Debbe Odea ([id number]) on 05/27/2023 10:13:58 AM    Recent Labs: 03/26/2023: ALT 17; BUN 11; Creatinine, Ser 0.78; Hemoglobin 13.2; Platelets 315; Potassium 4.2; Sodium 139; TSH 1.700  Recent Lipid Panel    Component Value Date/Time   CHOL 251 (H) 03/26/2023 1204   TRIG 78 03/26/2023 1204   HDL 81 03/26/2023 1204   CHOLHDL 3.1 03/26/2023 1204   LDLCALC 157 (H) 03/26/2023 1204     Risk Assessment/Calculations:             Physical Exam:    VS:  BP 110/76 (BP Location: Left Arm, Patient Position: Sitting, Cuff Size: Large)   Pulse (!) 59   Ht 5\' 3"  (1.6 m)   Wt 177 lb 3.2 oz (80.4 kg)   SpO2 99%   BMI 31.39 kg/m     Wt Readings from Last 3 Encounters:  05/27/23 177 lb 3.2 oz (80.4 kg)  03/26/23 175 lb 12.8 oz (79.7 kg)  02/16/23 172 lb (78 kg)     GEN:  Well nourished, well developed in no acute distress HEENT: Normal NECK: No JVD; No carotid bruits CARDIAC: RRR, no murmurs, rubs, gallops RESPIRATORY:  Clear to auscultation without rales, wheezing or rhonchi  ABDOMEN: Soft, non-tender, non-distended MUSCULOSKELETAL:  No edema; No deformity  SKIN: Warm and dry NEUROLOGIC:  Alert and oriented x 3 PSYCHIATRIC:  Normal affect   ASSESSMENT:    1. Precordial pain    2. Pure hypercholesterolemia    PLAN:    In order of problems listed above:  Chest pain, risk factors hyperlipidemia.  Obtain echocardiogram, obtain coronary CTA. Hyperlipidemia, not in statin benefit group, low-cholesterol diet advised.  Follow-up after cardiac testing     Medication Adjustments/Labs and Tests Ordered: Current medicines are reviewed at length with the patient today.  Concerns regarding medicines are outlined above.  Orders Placed This Encounter  Procedures   CT CORONARY MORPH W/CTA COR W/SCORE W/CA W/CM &/OR WO/CM   Basic  Metabolic Panel (BMET)   EKG 12-Lead   ECHOCARDIOGRAM COMPLETE   Meds ordered this encounter  Medications   metoprolol tartrate (LOPRESSOR) 25 MG tablet    Sig: Take 1 tablet (25 mg total) by mouth once for 1 dose. TWO HOUR PRIOR TO CARDIAC CT    Dispense:  1 tablet    Refill:  0    Patient Instructions  Medication Instructions:   Your physician recommends that you continue on your current medications as directed. Please refer to the Current Medication list given to you today.  *If you need a refill on your cardiac medications before your next appointment, please call your pharmacy*   Lab Work:  Your physician recommends you have labs - BMP  If you have labs (blood work) drawn today and your tests are completely normal, you will receive your results only by: MyChart Message (if you have MyChart) OR A paper copy in the mail If you have any lab test that is abnormal or we need to change your treatment, we will call you to review the results.   Testing/Procedures:  Your physician has requested that you have an echocardiogram. Echocardiography is a painless test that uses sound waves to create images of your heart. It provides your doctor with information about the size and shape of your heart and how well your heart's chambers and valves are working. This procedure takes approximately one hour. There are no restrictions for this  procedure. Please do NOT wear cologne, perfume, aftershave, or lotions (deodorant is allowed). Please arrive 15 minutes prior to your appointment time.    Your cardiac CT will be scheduled at one of the below locations:   [facility] [address] [place], [place] [address] [phone]  OR  [facility] [address] [place], [place] [address] [phone]  OR   [facility] Heart and Vascular entrance [address] [place], [place] [address] [phone]  Please follow these instructions carefully (unless otherwise directed):  An IV will be required for this test and Nitroglycerin will be given.  Hold all erectile dysfunction medications at least 3 days (72 hrs) prior to test. (Ie viagra, cialis, sildenafil, tadalafil, etc)   On the Night Before the Test: Be sure to Drink plenty of water. Do not consume any caffeinated/decaffeinated beverages or chocolate 12 hours prior to your test. Do not take any antihistamines 12 hours prior to your test. If the patient has contrast allergy:  On the Day of the Test: Drink plenty of water until 1 hour prior to the test. Do not eat any food 1 hour prior to test. You may take your regular medications prior to the test.  Take metoprolol (Lopressor) two hours prior to test. FEMALES- please wear underwire-free bra if available, avoid dresses & tight clothing      After the Test: Drink plenty of water. After receiving IV contrast, you may experience a mild flushed feeling. This is normal. On occasion, you may experience a mild rash up to 24 hours after the test. This is not dangerous. If this occurs, you can take Benadryl 25 mg and increase your fluid intake. If you experience trouble breathing, this can be serious. If it is severe call 911 IMMEDIATELY. If it is mild, please call our office. If you take any of these medications: Glipizide/Metformin, Avandament, Glucavance,  please do not take 48 hours after completing test unless otherwise instructed.  We will call to schedule your test  2-4 weeks out understanding that some insurance companies will need an authorization prior to the service being performed.   For more information and frequently asked questions, please visit our website : [email or web address]  For non-scheduling related questions, please contact the cardiac imaging nurse navigator should you have any questions/concerns: Cardiac Imaging Nurse Navigators Direct Office Dial: [phone]   For scheduling needs, including cancellations and rescheduling, please call [place], [phone].   Follow-Up: At [facility], you and your health needs are our priority.  As part of our continuing mission to provide you with exceptional heart care, we have created designated Provider Care Teams.  These Care Teams include your primary Cardiologist (physician) and Advanced Practice Providers (APPs -  Physician Assistants and Nurse Practitioners) who all work together to provide you with the care you need, when you need it.  We recommend signing up for the patient portal called "MyChart".  Sign up information is provided on this After Visit Summary.  MyChart is used to connect with patients for Virtual Visits (Telemedicine).  Patients are able to view lab/test results, encounter notes, upcoming appointments, etc.  Non-urgent messages can be sent to your provider as well.   To learn more about what you can do with MyChart, go to [email or web address].    Your next appointment:    After Cardiac testing  Provider:   You may see Debbe Odea, MD or one of the following Advanced Practice Providers on your designated Care Team:   Nicolasa Ducking, NP Eula Listen, PA-C Cadence Fransico Michael, PA-C Charlsie Quest, NP   Signed, Debbe Odea, MD  05/27/2023 11:44 AM    [facility]

## 2023-05-28 LAB — BASIC METABOLIC PANEL
BUN/Creatinine Ratio: 10 (ref 9–23)
BUN: 8 mg/dL (ref 6–20)
CO2: 20 mmol/L (ref 20–29)
Calcium: 9.2 mg/dL (ref 8.7–10.2)
Chloride: 102 mmol/L (ref 96–106)
Creatinine, Ser: 0.83 mg/dL (ref 0.57–1.00)
Glucose: 94 mg/dL (ref 70–99)
Potassium: 4.1 mmol/L (ref 3.5–5.2)
Sodium: 139 mmol/L (ref 134–144)
eGFR: 94 mL/min/{1.73_m2} (ref >59)

## 2023-06-17 ENCOUNTER — Ambulatory Visit: Payer: No Typology Code available for payment source | Attending: Cardiology

## 2023-06-17 DIAGNOSIS — R072 Precordial pain: Secondary | ICD-10-CM

## 2023-06-17 LAB — ECHOCARDIOGRAM COMPLETE
AR max vel: 2.36 cm2
AV Area VTI: 2.26 cm2
AV Area mean vel: 2.22 cm2
AV Mean grad: 3 mm[Hg]
AV Peak grad: 5.9 mm[Hg]
Ao pk vel: 1.21 m/s
Area-P 1/2: 4.6 cm2
S' Lateral: 3.4 cm

## 2023-07-01 ENCOUNTER — Ambulatory Visit
Admission: RE | Admit: 2023-07-01 | Discharge: 2023-07-01 | Disposition: A | Discharge status: Home / Self Care | Payer: No Typology Code available for payment source | Source: Ambulatory Visit | Attending: Cardiology | Admitting: Cardiology

## 2023-07-01 DIAGNOSIS — R072 Precordial pain: Secondary | ICD-10-CM | POA: Diagnosis present

## 2023-07-01 MED ORDER — IOHEXOL 350 MG/ML SOLN
100.0000 mL | Freq: Once | INTRAVENOUS | Status: AC | PRN
  Administered 2023-07-01: 100 mL via INTRAVENOUS

## 2023-07-01 MED ORDER — SODIUM CHLORIDE 0.9 % IV SOLN: INTRAVENOUS | Status: DC

## 2023-07-01 MED ORDER — NITROGLYCERIN 0.4 MG SL SUBL
0.4000 mg | SUBLINGUAL_TABLET | Freq: Once | SUBLINGUAL | Status: AC
  Administered 2023-07-01: 0.4 mg via SUBLINGUAL

## 2023-07-01 NOTE — Progress Notes (Signed)
Pt tolerated procedure well with no issues. Pt ABCs intact. Pt denies any complaints. Pt encouraged to drink plenty of water throughout the day. Pt ambulatory with steady gait.

## 2023-07-09 ENCOUNTER — Encounter: Payer: Self-pay | Admitting: Cardiology

## 2023-07-09 ENCOUNTER — Ambulatory Visit: Payer: No Typology Code available for payment source | Attending: Cardiology | Admitting: Cardiology

## 2023-07-09 VITALS — BP 98/72 | HR 77 | Ht 63.0 in | Wt 176.8 lb

## 2023-07-09 DIAGNOSIS — R072 Precordial pain: Secondary | ICD-10-CM | POA: Diagnosis not present

## 2023-07-09 DIAGNOSIS — E7800 Pure hypercholesterolemia, unspecified: Secondary | ICD-10-CM | POA: Diagnosis not present

## 2023-07-09 MED ORDER — NITROGLYCERIN 0.4 MG SL SUBL: 0.4000 mg | SUBLINGUAL_TABLET | SUBLINGUAL | 2 refills | Status: DC | PRN

## 2023-07-09 NOTE — Progress Notes (Signed)
Cardiology Office Note:    Date:  07/09/2023   ID:  Joyce Rice, DOB [birth year], MRN [id number]  PCP:  Tollie Eth, NP   [facility] Providers Cardiologist:  Debbe Odea, MD     Referring MD: Tollie Eth, NP   Chief Complaint  Patient presents with   Follow-up    Discuss cardiac testing results.      History of Present Illness:    Joyce Rice is a 37 y.o. female with a hx of hyperlipidemia, thyroid dysfunction presenting for follow-up.  Patient was last seen with chest pain.  Echocardiogram and coronary CT was obtained to evaluate cardiac etiology.  She still endorses chest pain, occurring randomly not associated with exertion or food intake.  Denies discomfort with palpation of chest..  Presents for cardiac testing results.   Past Medical History:  Diagnosis Date   Abdominal pain 10/08/2021   Bartholin cyst    Chlamydia    Chlamydia 12/29/2011   Azithromycin given 12/29/11, vomited. Partner Tx per pt. Rx Doxy 01/29/12.    Diarrhea 10/08/2021   Headache    Hypothyroid    Infection    UTI   Irregular periods/menstrual cycles    PONV (postoperative nausea and vomiting)     Past Surgical History:  Procedure Laterality Date   COLONOSCOPY  10/14/2021   DILATION AND CURETTAGE OF UTERUS     INDUCED ABORTION     LAPAROSCOPIC TUBAL LIGATION Bilateral 05/26/2016   Procedure: LAPAROSCOPIC TUBAL LIGATION;  Surgeon: Catalina Antigua, MD;  Location: [facility];  Service: Gynecology;  Laterality: Bilateral;    Current Medications: Current Meds  Medication Sig   Multiple Vitamin (MULTIVITAMIN) tablet Take 1 tablet by mouth daily.   nitroGLYCERIN (NITROSTAT) 0.4 MG SL tablet Place 1 tablet (0.4 mg total) under the tongue every 5 (five) minutes as needed for chest pain. If chest pain is not relieved after 3 tablets CALL 911     Allergies:   Monistat [miconazole]   Social History   Socioeconomic History   Marital status: Single    Spouse name: Not on  file   Number of children: Not on file   Years of education: Not on file   Highest education level: Not on file  Occupational History   Not on file  Tobacco Use   Smoking status: Never   Smokeless tobacco: Never  Vaping Use   Vaping status: Never Used  Substance and Sexual Activity   Alcohol use: Yes    Comment: once or twice a year   Drug use: No   Sexual activity: Not Currently    Birth control/protection: Surgical  Other Topics Concern   Not on file  Social History Narrative   Not on file   Social Determinants of Health   Financial Resource Strain: Low Risk  (03/26/2023)   Overall Financial Resource Strain (CARDIA)    Difficulty of Paying Living Expenses: Not hard at all  Food Insecurity: No Food Insecurity (03/26/2023)   Hunger Vital Sign    Worried About Running Out of Food in the Last Year: Never true    Ran Out of Food in the Last Year: Never true  Transportation Needs: No Transportation Needs (03/26/2023)   PRAPARE - Administrator, Civil Service (Medical): No    Lack of Transportation (Non-Medical): No  Physical Activity: Insufficiently Active (03/26/2023)   Exercise Vital Sign    Days of Exercise per Week: 1 day    Minutes of Exercise per  Session: 30 min  Stress: No Stress Concern Present (03/26/2023)   [employer] of Occupational Health - Occupational Stress Questionnaire    Feeling of Stress : Not at all  Social Connections: Unknown (03/26/2023)   Social Connection and Isolation Panel [NHANES]    Frequency of Communication with Friends and Family: Three times a week    Frequency of Social Gatherings with Friends and Family: Three times a week    Attends Religious Services: 1 to 4 times per year    Active Member of Clubs or Organizations: No    Attends Banker Meetings: Never    Marital Status: Patient unable to answer     Family History: The patient's family history is negative for Hearing loss, Colon cancer, and Esophageal  cancer.  ROS:   Please see the history of present illness.     All other systems reviewed and are negative.  EKGs/Labs/Other Studies Reviewed:    The following studies were reviewed today:       Recent Labs: 03/26/2023: ALT 17; Hemoglobin 13.2; Platelets 315; TSH 1.700 05/27/2023: BUN 8; Creatinine, Ser 0.83; Potassium 4.1; Sodium 139  Recent Lipid Panel    Component Value Date/Time   CHOL 251 (H) 03/26/2023 1204   TRIG 78 03/26/2023 1204   HDL 81 03/26/2023 1204   CHOLHDL 3.1 03/26/2023 1204   LDLCALC 157 (H) 03/26/2023 1204     Risk Assessment/Calculations:             Physical Exam:    VS:  BP 98/72 (BP Location: Left Arm, Patient Position: Sitting, Cuff Size: Large)   Pulse 77   Ht 5\' 3"  (1.6 m)   Wt 176 lb 12.8 oz (80.2 kg)   LMP 06/07/2023 (Exact Date)   SpO2 99%   BMI 31.32 kg/m     Wt Readings from Last 3 Encounters:  07/09/23 176 lb 12.8 oz (80.2 kg)  05/27/23 177 lb 3.2 oz (80.4 kg)  03/26/23 175 lb 12.8 oz (79.7 kg)     GEN:  Well nourished, well developed in no acute distress HEENT: Normal NECK: No JVD; No carotid bruits CARDIAC: RRR, no murmurs, rubs, gallops RESPIRATORY:  Clear to auscultation without rales, wheezing or rhonchi  ABDOMEN: Soft, non-tender, non-distended MUSCULOSKELETAL:  No edema; No deformity  SKIN: Warm and dry NEUROLOGIC:  Alert and oriented x 3 PSYCHIATRIC:  Normal affect   ASSESSMENT:    1. Precordial pain   2. Pure hypercholesterolemia    PLAN:    In order of problems listed above:  Chest pain, coronary CTA 11/24 with no CAD, calcium score 0.  Echo 05/2023 EF 60 to 65%.  No findings to suggest etiology of chest pain.?  Not sure if this is due to esophageal spasm, versus coronary spasm, will try sublingual nitro to see if this helps patient's symptoms.  Consider Imdur if nitro relieves her symptoms. Hyperlipidemia, not in statin benefit group, continue low-cholesterol diet.  Follow-up in 3 months.      Medication Adjustments/Labs and Tests Ordered: Current medicines are reviewed at length with the patient today.  Concerns regarding medicines are outlined above.  No orders of the defined types were placed in this encounter.  Meds ordered this encounter  Medications   nitroGLYCERIN (NITROSTAT) 0.4 MG SL tablet    Sig: Place 1 tablet (0.4 mg total) under the tongue every 5 (five) minutes as needed for chest pain. If chest pain is not relieved after 3 tablets CALL 911  Dispense:  25 tablet    Refill:  2    Patient Instructions  Medication Instructions:   As needed Take NTG Place 1 tablet (0.4 mg total) under the tongue every 5 (five) minutes as needed for chest pain. Max 3 tablets. If chest pain is not relieved after 3 tablets seek urgent medical attention.   *If you need a refill on your cardiac medications before your next appointment, please call your pharmacy*   Lab Work:  . None Ordered  If you have labs (blood work) drawn today and your tests are completely normal, you will receive your results only by: MyChart Message (if you have MyChart) OR A paper copy in the mail If you have any lab test that is abnormal or we need to change your treatment, we will call you to review the results.   Testing/Procedures:  None Ordered   Follow-Up: At [facility], you and your health needs are our priority.  As part of our continuing mission to provide you with exceptional heart care, we have created designated Provider Care Teams.  These Care Teams include your primary Cardiologist (physician) and Advanced Practice Providers (APPs -  Physician Assistants and Nurse Practitioners) who all work together to provide you with the care you need, when you need it.  We recommend signing up for the patient portal called "MyChart".  Sign up information is provided on this After Visit Summary.  MyChart is used to connect with patients for Virtual Visits (Telemedicine).  Patients are able  to view lab/test results, encounter notes, upcoming appointments, etc.  Non-urgent messages can be sent to your provider as well.   To learn more about what you can do with MyChart, go to [email or web address].    Your next appointment:   3 month(s)  Provider:   You may see Debbe Odea, MD or one of the following Advanced Practice Providers on your designated Care Team:   Nicolasa Ducking, NP Eula Listen, PA-C Cadence Fransico Michael, PA-C Charlsie [facility], NP Carlos Levering, NP   Signed, Debbe Odea, MD  07/09/2023 2:40 PM    [facility]

## 2023-07-09 NOTE — Patient Instructions (Signed)
Medication Instructions:   As needed Take NTG Place 1 tablet (0.4 mg total) under the tongue every 5 (five) minutes as needed for chest pain. Max 3 tablets. If chest pain is not relieved after 3 tablets seek urgent medical attention.   *If you need a refill on your cardiac medications before your next appointment, please call your pharmacy*   Lab Work:  . None Ordered  If you have labs (blood work) drawn today and your tests are completely normal, you will receive your results only by: MyChart Message (if you have MyChart) OR A paper copy in the mail If you have any lab test that is abnormal or we need to change your treatment, we will call you to review the results.   Testing/Procedures:  None Ordered   Follow-Up: At [facility], you and your health needs are our priority.  As part of our continuing mission to provide you with exceptional heart care, we have created designated Provider Care Teams.  These Care Teams include your primary Cardiologist (physician) and Advanced Practice Providers (APPs -  Physician Assistants and Nurse Practitioners) who all work together to provide you with the care you need, when you need it.  We recommend signing up for the patient portal called "MyChart".  Sign up information is provided on this After Visit Summary.  MyChart is used to connect with patients for Virtual Visits (Telemedicine).  Patients are able to view lab/test results, encounter notes, upcoming appointments, etc.  Non-urgent messages can be sent to your provider as well.   To learn more about what you can do with MyChart, go to [email or web address].    Your next appointment:   3 month(s)  Provider:   You may see Debbe Odea, MD or one of the following Advanced Practice Providers on your designated Care Team:   Nicolasa Ducking, NP Eula Listen, PA-C Cadence Fransico Michael, PA-C Charlsie Quest, NP Carlos Levering, NP

## 2023-08-26 ENCOUNTER — Encounter: Payer: Self-pay | Admitting: Gastroenterology

## 2023-10-14 ENCOUNTER — Other Ambulatory Visit: Payer: Self-pay

## 2023-10-14 ENCOUNTER — Emergency Department: Payer: Commercial Managed Care - PPO

## 2023-10-14 ENCOUNTER — Emergency Department
Admission: EM | Admit: 2023-10-14 | Discharge: 2023-10-14 | Disposition: A | Discharge status: Home / Self Care | Payer: Commercial Managed Care - PPO | Attending: Emergency Medicine | Admitting: Emergency Medicine

## 2023-10-14 DIAGNOSIS — R059 Cough, unspecified: Secondary | ICD-10-CM | POA: Diagnosis present

## 2023-10-14 DIAGNOSIS — J069 Acute upper respiratory infection, unspecified: Secondary | ICD-10-CM | POA: Diagnosis not present

## 2023-10-14 LAB — RESP PANEL BY RT-PCR (RSV, FLU A&B, COVID)  RVPGX2
Influenza A by PCR: NEGATIVE
Influenza B by PCR: NEGATIVE
Resp Syncytial Virus by PCR: NEGATIVE
SARS Coronavirus 2 by RT PCR: NEGATIVE

## 2023-10-14 MED ORDER — PROMETHAZINE-DM 6.25-15 MG/5ML PO SYRP: 2.5000 mL | ORAL_SOLUTION | Freq: Four times a day (QID) | ORAL | 0 refills | Status: DC | PRN

## 2023-10-14 NOTE — Discharge Instructions (Signed)
 Get plenty of fluids.  Tylenol 650 mg 4 times daily for symptoms.  Use cough syrup.  Follow-up with primary.  Return here for new or worse symptoms.

## 2023-10-14 NOTE — ED Triage Notes (Signed)
 Pt sts that she has been having a cough with chills and night sweats for the last several days.

## 2023-10-14 NOTE — ED Provider Triage Note (Signed)
 Emergency Medicine Provider Triage Evaluation Note  Joyce Rice , a 38 y.o. female  was evaluated in triage.  Pt complains of 6 days of flulike symptoms with cough congestion sore throat headache fever at home..  Review of Systems  Positive:  Negative:   Physical Exam  BP (!) 142/78 (BP Location: Left Arm)   Pulse 79   Temp 98.7 F (37.1 C) (Oral)   Resp 17   SpO2 99%  Gen:   Awake, no distress   Resp:  Normal effort  MSK:   Moves extremities without difficulty  Other:    Medical Decision Making  Medically screening exam initiated at 6:32 PM.  Appropriate orders placed.  Joyce Rice was informed that the remainder of the evaluation will be completed by another provider, this initial triage assessment does not replace that evaluation, and the importance of remaining in the ED until their evaluation is complete.  Flulike symptoms hemodynamically stable overall well-appearing will check chest x-ray   Joyce Bame, PA-C 10/14/23 2130

## 2023-10-14 NOTE — ED Provider Notes (Signed)
 [place] EMERGENCY DEPARTMENT AT [facility] Provider Note   CSN: [id number] Arrival date & time: 10/14/23  1824     History  Chief Complaint  Patient presents with   Cough    Joyce Rice is a 38 y.o. female.  Patient here for cough congestion.  She notes about 8 days of fevers chills cough and fatigue.  Has been treating with over-the-counter cough syrup without any improvement.  No difficulty breathing.  No history of smoking COPD or asthma.   Cough Associated symptoms: chills and fever   Associated symptoms: no chest pain and no shortness of breath        Home Medications Prior to Admission medications   Medication Sig Start Date End Date Taking? Authorizing Provider  promethazine-dextromethorphan (PROMETHAZINE-DM) 6.25-15 MG/5ML syrup Take 2.5 mLs by mouth 4 (four) times daily as needed for cough. 10/14/23  Yes Christen Bame, PA-C  Multiple Vitamin (MULTIVITAMIN) tablet Take 1 tablet by mouth daily.    [provider]  nitroGLYCERIN (NITROSTAT) 0.4 MG SL tablet Place 1 tablet (0.4 mg total) under the tongue every 5 (five) minutes as needed for chest pain. If chest pain is not relieved after 3 tablets CALL 911 07/09/23 10/07/23  Debbe Odea, MD  voriconazole (VFEND) 200 MG tablet Take 1 tablet (200 mg total) by mouth 2 (two) times daily. Patient not taking: Reported on 05/27/2023 04/09/23   Early, Sung Amabile, NP      Allergies    Monistat [miconazole]    Review of Systems   Review of Systems  Constitutional:  Positive for chills and fever.  HENT:  Positive for congestion.   Respiratory:  Positive for cough. Negative for shortness of breath.   Cardiovascular:  Negative for chest pain.  Gastrointestinal:  Negative for abdominal pain, nausea and vomiting.  Neurological:  Negative for weakness and numbness.    Physical Exam Updated Vital Signs BP (!) 142/78 (BP Location: Left Arm)   Pulse 79   Temp 98.7 F (37.1 C) (Oral)   Resp 17   SpO2  99%  Physical Exam Vitals and nursing note reviewed.  Constitutional:      General: She is not in acute distress.    Appearance: She is well-developed.  HENT:     Head: Normocephalic and atraumatic.  Eyes:     Conjunctiva/sclera: Conjunctivae normal.  Cardiovascular:     Rate and Rhythm: Normal rate and regular rhythm.     Heart sounds: No murmur heard. Pulmonary:     Effort: Pulmonary effort is normal. No respiratory distress.     Breath sounds: Normal breath sounds.  Abdominal:     Palpations: Abdomen is soft.  Musculoskeletal:        General: No swelling.     Cervical back: Neck supple.  Skin:    General: Skin is warm and dry.     Capillary Refill: Capillary refill takes less than 2 seconds.  Neurological:     Mental Status: She is alert.  Psychiatric:        Mood and Affect: Mood normal.     ED Results / Procedures / Treatments   Labs (all labs ordered are listed, but only abnormal results are displayed) Labs Reviewed  RESP PANEL BY RT-PCR (RSV, FLU A&B, COVID)  RVPGX2    EKG None  Radiology DG Chest 2 View Result Date: 10/14/2023 CLINICAL DATA:  Cough EXAM: CHEST - 2 VIEW COMPARISON:  X-ray 08/19/2021. FINDINGS: No consolidation, pneumothorax or effusion. No edema. Normal  cardiopericardial silhouette. IMPRESSION: No acute cardiopulmonary disease. Electronically Signed   By: Karen Kays M.D.   On: 10/14/2023 20:33    Procedures Procedures    Medications Ordered in ED Medications - No data to display  ED Course/ Medical Decision Making/ A&P                                 Medical Decision Making Patient here for flulike symptoms she is healthy 38 year old female afebrile nontachycardic hemodynamically stable no desaturation no prior pulmonary illnesses.  She tested negative for COVID flu and RSV.  Chest x-ray is clear, nonacute.  Given her generalized well appearance and overall negative workup I will recommend outpatient treatment to send in symptom  control follow-up with primary.  Given return precautions here.  Amount and/or Complexity of Data Reviewed Radiology: ordered.           Final Clinical Impression(s) / ED Diagnoses Final diagnoses:  Viral URI with cough    Rx / DC Orders ED Discharge Orders          Ordered    promethazine-dextromethorphan (PROMETHAZINE-DM) 6.25-15 MG/5ML syrup  4 times daily PRN        10/14/23 2144              Christen Bame, PA-C 10/14/23 2144    Dionne Bucy, MD 10/14/23 2213

## 2024-01-08 ENCOUNTER — Other Ambulatory Visit: Payer: Self-pay

## 2024-01-08 ENCOUNTER — Emergency Department
Admission: EM | Admit: 2024-01-08 | Discharge: 2024-01-08 | Disposition: A | Discharge status: Home / Self Care | Attending: Emergency Medicine | Admitting: Emergency Medicine

## 2024-01-08 ENCOUNTER — Encounter: Payer: Self-pay | Admitting: Emergency Medicine

## 2024-01-08 ENCOUNTER — Emergency Department

## 2024-01-08 DIAGNOSIS — S0990XA Unspecified injury of head, initial encounter: Secondary | ICD-10-CM | POA: Diagnosis present

## 2024-01-08 DIAGNOSIS — R519 Headache, unspecified: Secondary | ICD-10-CM | POA: Insufficient documentation

## 2024-01-08 DIAGNOSIS — R0789 Other chest pain: Secondary | ICD-10-CM | POA: Insufficient documentation

## 2024-01-08 DIAGNOSIS — Y92410 Unspecified street and highway as the place of occurrence of the external cause: Secondary | ICD-10-CM | POA: Diagnosis not present

## 2024-01-08 DIAGNOSIS — S060X0A Concussion without loss of consciousness, initial encounter: Secondary | ICD-10-CM | POA: Insufficient documentation

## 2024-01-08 DIAGNOSIS — G501 Atypical facial pain: Secondary | ICD-10-CM | POA: Insufficient documentation

## 2024-01-08 MED ORDER — IBUPROFEN 600 MG PO TABS
600.0000 mg | ORAL_TABLET | Freq: Once | ORAL | Status: AC
  Administered 2024-01-08: 600 mg via ORAL
  Filled 2024-01-08: qty 1
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
## 2024-02-21 ENCOUNTER — Telehealth: Payer: Self-pay | Admitting: Nurse Practitioner

## 2024-02-21 NOTE — Telephone Encounter (Signed)
 Left message for pt to call me back to schedule cpe  Copied from CRM [id number]. Topic: Appointments - Scheduling Inquiry for Clinic >> Feb 21, 2024 12:44 PM Graeme ORN wrote: Reason for CRM: Patient called. Would like to schedule annual visit. Next available not until 09/2024. Last physical 03/26/2023. Added patient to waitlist. She would like to be notified if something opened up. Thank You    ----------------------------------------------------------------------- From previous Reason for Contact - Scheduling: Patient/patient representative is calling to schedule an appointment. Refer to attachments for appointment information.

## 2024-03-06 ENCOUNTER — Telehealth: Payer: Self-pay

## 2024-03-06 NOTE — Telephone Encounter (Signed)
 Last TSH was 8/24 and it was normal. Pts. Next apt is 11/25 did you want her to come in earlier for thyroid  check.   Copied from CRM [id number]. Topic: Clinical - Request for Lab/Test Order >> Mar 06, 2024  3:40 PM Carlatta H wrote: Reason for CRM: Patient would like to have lab orders for thyroid  check//Please call

## 2024-03-07 NOTE — Telephone Encounter (Signed)
 Patient said she is having swelling in her hands and feet. She also has some swelling in her nodules in her neck with throat pain. She says this is the reason she would like to have her thyroid  levels checked

## 2024-03-08 ENCOUNTER — Other Ambulatory Visit: Payer: Self-pay | Admitting: "Endocrinology

## 2024-03-08 ENCOUNTER — Encounter: Payer: Self-pay | Admitting: Nurse Practitioner

## 2024-03-08 ENCOUNTER — Telehealth: Payer: Self-pay | Admitting: "Endocrinology

## 2024-03-08 DIAGNOSIS — E0500 Thyrotoxicosis with diffuse goiter without thyrotoxic crisis or storm: Secondary | ICD-10-CM

## 2024-03-08 DIAGNOSIS — E0590 Thyrotoxicosis, unspecified without thyrotoxic crisis or storm: Secondary | ICD-10-CM

## 2024-03-08 NOTE — Telephone Encounter (Signed)
 Her Endocrinologist has put in future orders for labs and pt wants to come here to have them drawn. Is this ok? Send to Adam or front office to schedule if ok.   I will be gone the rest of week so unable to schedule patient     Copied from CRM #[id number]. Topic: Clinical - Request for Lab/Test Order >> Mar 08, 2024  1:36 PM Everette C wrote: Reason for CRM: The patient would like to speak with a member of practice staff regarding orders for thyroid  testing that have been submitted to the practice via their endocrinologist. Please contact the patient when available

## 2024-03-08 NOTE — Telephone Encounter (Signed)
 Pt is wanting Thyroid  levels rechecked, does she need just a f/u appt and labs?  If so can [facility] order [facility]?  She is wanting labs drawn through PCP.  She's experiencing some of the same symptoms as she was before.

## 2024-03-08 NOTE — Telephone Encounter (Signed)
 Can you redo labs to [facility]

## 2024-03-08 NOTE — Telephone Encounter (Signed)
 LVM to tell of Dr. Barbette recommendations

## 2024-03-09 ENCOUNTER — Telehealth: Payer: Self-pay | Admitting: Nurse Practitioner

## 2024-03-09 NOTE — Telephone Encounter (Signed)
 Informed patient we could not draw labs for endocrinology here, advised her Endo had mentioned in notes sending her to [facility] instead of [facility] and she cn discuss that with endo but we are not able to draw here

## 2024-04-01 LAB — COMPREHENSIVE METABOLIC PANEL WITH GFR
AG Ratio: 1.6 (calc) (ref 1.0–2.5)
ALT: 13 U/L (ref 6–29)
AST: 13 U/L (ref 10–30)
Albumin: 4.6 g/dL (ref 3.6–5.1)
Alkaline phosphatase (APISO): 70 U/L (ref 31–125)
BUN: 11 mg/dL (ref 7–25)
CO2: 25 mmol/L (ref 20–32)
Calcium: 9.4 mg/dL (ref 8.6–10.2)
Chloride: 105 mmol/L (ref 98–110)
Creat: 0.82 mg/dL (ref 0.50–0.97)
Globulin: 2.9 g/dL (ref 1.9–3.7)
Glucose, Bld: 113 mg/dL — ABNORMAL HIGH (ref 65–99)
Potassium: 3.7 mmol/L (ref 3.5–5.3)
Sodium: 140 mmol/L (ref 135–146)
Total Bilirubin: 0.3 mg/dL (ref 0.2–1.2)
Total Protein: 7.5 g/dL (ref 6.1–8.1)
eGFR: 95 mL/min/1.73m2 (ref >=60)

## 2024-04-01 LAB — T3: T3, Total: 103 ng/dL (ref 76–181)

## 2024-04-01 LAB — TSH: TSH: 1.19 m[IU]/L

## 2024-04-01 LAB — T4, FREE: Free T4: 1.1 ng/dL (ref 0.8–1.8)

## 2024-04-07 ENCOUNTER — Encounter: Payer: Self-pay | Admitting: "Endocrinology

## 2024-04-07 ENCOUNTER — Ambulatory Visit (INDEPENDENT_AMBULATORY_CARE_PROVIDER_SITE_OTHER): Payer: Self-pay | Admitting: "Endocrinology

## 2024-04-07 VITALS — BP 116/76 | HR 68 | Ht 63.0 in | Wt 187.4 lb

## 2024-04-07 DIAGNOSIS — Z6833 Body mass index (BMI) 33.0-33.9, adult: Secondary | ICD-10-CM

## 2024-04-07 DIAGNOSIS — E782 Mixed hyperlipidemia: Secondary | ICD-10-CM

## 2024-04-07 DIAGNOSIS — E0590 Thyrotoxicosis, unspecified without thyrotoxic crisis or storm: Secondary | ICD-10-CM

## 2024-04-07 DIAGNOSIS — E66811 Obesity, class 1: Secondary | ICD-10-CM

## 2024-04-07 DIAGNOSIS — E6609 Other obesity due to excess calories: Secondary | ICD-10-CM | POA: Diagnosis not present

## 2024-04-07 MED ORDER — ZEPBOUND 2.5 MG/0.5ML ~~LOC~~ SOAJ: 2.5000 mg | SUBCUTANEOUS | 0 refills | Status: DC

## 2024-04-07 NOTE — Progress Notes (Signed)
 04/07/2024      Endocrinology follow-up note    Subjective:    Patient ID: Joyce Rice, female    DOB: [birth year], PCP Early, Camie BRAVO, NP.   Past Medical History:  Diagnosis Date   Abdominal pain 10/08/2021   Bartholin cyst    Chlamydia    Chlamydia 12/29/2011   Azithromycin  given 12/29/11, vomited. Partner Tx per pt. Rx Doxy 01/29/12.    Diarrhea 10/08/2021   Headache    Hyperthyroidism    Hypothyroid    Infection    UTI   Irregular periods/menstrual cycles    PONV (postoperative nausea and vomiting)     Past Surgical History:  Procedure Laterality Date   COLONOSCOPY  10/14/2021   DILATION AND CURETTAGE OF UTERUS     INDUCED ABORTION     LAPAROSCOPIC TUBAL LIGATION Bilateral 05/26/2016   Procedure: LAPAROSCOPIC TUBAL LIGATION;  Surgeon: Winton Felt, MD;  Location: [facility];  Service: Gynecology;  Laterality: Bilateral;    Social History   Socioeconomic History   Marital status: Single    Spouse name: Not on file   Number of children: Not on file   Years of education: Not on file   Highest education level: Not on file  Occupational History   Not on file  Tobacco Use   Smoking status: Never   Smokeless tobacco: Never  Vaping Use   Vaping status: Never Used  Substance and Sexual Activity   Alcohol use: Yes    Comment: once or twice a year   Drug use: No   Sexual activity: Not Currently    Birth control/protection: Surgical  Other Topics Concern   Not on file  Social History Narrative   Not on file   Social Drivers of Health   Financial Resource Strain: Low Risk  (03/26/2023)   Overall Financial Resource Strain (CARDIA)    Difficulty of Paying Living Expenses: Not hard at all  Food Insecurity: No Food Insecurity (03/26/2023)   Hunger Vital Sign    Worried About Running Out of Food in the Last Year: Never true    Ran Out of Food in the Last Year: Never true  Transportation Needs: No Transportation Needs (03/26/2023)   PRAPARE -  Administrator, Civil Service (Medical): No    Lack of Transportation (Non-Medical): No  Physical Activity: Insufficiently Active (03/26/2023)   Exercise Vital Sign    Days of Exercise per Week: 1 day    Minutes of Exercise per Session: 30 min  Stress: No Stress Concern Present (03/26/2023)   [employer] of Occupational Health - Occupational Stress Questionnaire    Feeling of Stress : Not at all  Social Connections: Unknown (03/26/2023)   Social Connection and Isolation Panel    Frequency of Communication with Friends and Family: Three times a week    Frequency of Social Gatherings with Friends and Family: Three times a week    Attends Religious Services: 1 to 4 times per year    Active Member of Clubs or Organizations: No    Attends Banker Meetings: Never    Marital Status: Patient unable to answer    Family History  Problem Relation Age of Onset   Hearing loss Neg Hx    Colon cancer Neg Hx    Esophageal cancer Neg Hx     Outpatient Encounter Medications as of 04/07/2024  Medication Sig   tirzepatide  (ZEPBOUND ) 2.5 MG/0.5ML Pen Inject 2.5 mg into the skin once  a week.   Multiple Vitamin (MULTIVITAMIN) tablet Take 1 tablet by mouth daily.   nitroGLYCERIN  (NITROSTAT ) 0.4 MG SL tablet Place 1 tablet (0.4 mg total) under the tongue every 5 (five) minutes as needed for chest pain. If chest pain is not relieved after 3 tablets CALL 911   promethazine -dextromethorphan (PROMETHAZINE -DM) 6.25-15 MG/5ML syrup Take 2.5 mLs by mouth 4 (four) times daily as needed for cough.   voriconazole  (VFEND ) 200 MG tablet Take 1 tablet (200 mg total) by mouth 2 (two) times daily. (Patient not taking: Reported on 05/27/2023)   No facility-administered encounter medications on file as of 04/07/2024.    ALLERGIES: Allergies  Allergen Reactions   Monistat [Miconazole] Rash    irritation    VACCINATION STATUS: Immunization History  Administered Date(s) Administered    Hepatitis B, ADULT 07/14/2020     HPI  Joyce Rice is 38 y.o. female who presents today with a medical history as above. she is being seen in follow-up after she was seen in consultation for hyperthyroidism requested by Early, Camie BRAVO, NP.   After symptomatic presentation and subsequent confirmation of primary hypothyroidism from Graves' disease with high uptake of 54%, she was approached for treatment options in December 2023.  However patient declined this treatment options and did not return for follow-up.    She made a phone call recently which triggered repeat thyroid  function tests and office visit.  Her previsit labs are consistent with euthyroid presentation currently.  -Her major concern now is weight gain and would like to achieve some weight loss. She denies palpitations, tremors, heat intolerance.  She denies dysphagia, shortness of breath, no voice change.  She is not following any particular diet and exercise program at this time.   she denies family history of thyroid  dysfunction , nor thyroid  malignancy.    she denies personal history of goiter. she is not on any anti-thyroid  medications nor on any thyroid  hormone supplements.                           Review of systems  Constitutional: + Weight regain,  + fatigue, + subjective hyperthermia Eyes: no blurry vision, - xerophthalmia ENT: no sore throat, no nodules palpated in throat, no dysphagia/odynophagia, nor hoarseness    Objective:    BP 116/76   Pulse 68   Ht 5' 3 (1.6 m)   Wt 187 lb 6.4 oz (85 kg)   BMI 33.20 kg/m   Wt Readings from Last 3 Encounters:  04/07/24 187 lb 6.4 oz (85 kg)  01/08/24 175 lb (79.4 kg)  07/09/23 176 lb 12.8 oz (80.2 kg)                                                Physical exam  Constitutional: Body mass index is 33.2 kg/m., not in acute distress, stable state of mind Eyes: PERRLA, EOMI, - exophthalmos    CMP     Component Value Date/Time   NA 140 03/31/2024 1538    NA 139 05/27/2023 1057   K 3.7 03/31/2024 1538   CL 105 03/31/2024 1538   CO2 25 03/31/2024 1538   GLUCOSE 113 (H) 03/31/2024 1538   BUN 11 03/31/2024 1538   BUN 8 05/27/2023 1057   CREATININE 0.82 03/31/2024 1538   CALCIUM 9.4 03/31/2024 1538  PROT 7.5 03/31/2024 1538   PROT 7.6 03/26/2023 1204   ALBUMIN 4.5 03/26/2023 1204   AST 13 03/31/2024 1538   ALT 13 03/31/2024 1538   ALKPHOS 84 03/26/2023 1204   BILITOT 0.3 03/31/2024 1538   BILITOT 0.5 03/26/2023 1204   GFRNONAA >60 09/08/2021 0855   GFRNONAA >89 11/03/2013 1112   GFRAA >60 01/22/2020 1253   GFRAA >89 11/03/2013 1112     CBC    Component Value Date/Time   WBC 8.3 03/26/2023 1204   WBC 6.4 12/01/2021 1505   RBC 4.39 03/26/2023 1204   RBC 4.18 12/01/2021 1505   HGB 13.2 03/26/2023 1204   HCT 39.7 03/26/2023 1204   PLT 315 03/26/2023 1204   MCV 90 03/26/2023 1204   MCH 30.1 03/26/2023 1204   MCH 30.5 09/08/2021 0855   MCHC 33.2 03/26/2023 1204   MCHC 33.7 12/01/2021 1505   RDW 11.8 03/26/2023 1204   LYMPHSABS 2.9 03/26/2023 1204   MONOABS 0.5 12/01/2021 1505   EOSABS 0.2 03/26/2023 1204   BASOSABS 0.0 03/26/2023 1204     Diabetic Labs (most recent): Lab Results  Component Value Date   HGBA1C 5.5 03/26/2023   HGBA1C 5.5 11/24/2022    Lipid Panel     Component Value Date/Time   CHOL 251 (H) 03/26/2023 1204   TRIG 78 03/26/2023 1204   HDL 81 03/26/2023 1204   CHOLHDL 3.1 03/26/2023 1204   LDLCALC 157 (H) 03/26/2023 1204   LABVLDL 13 03/26/2023 1204     Lab Results  Component Value Date   TSH 1.19 03/31/2024   TSH 1.700 03/26/2023   TSH 2.190 10/01/2022   TSH <0.005 (L) 04/09/2022   TSH <0.005 (L) 03/12/2022   TSH <0.005 (L) 03/04/2022   TSH 2.292 12/11/2020   FREET4 1.1 03/31/2024   FREET4 1.08 10/01/2022   FREET4 3.04 (H) 03/12/2022   FREET4 3.49 (H) 03/04/2022     Assessment & Plan:   1. Hyperthyroidism 2.  Graves' disease 3.  Obesity 4.  Hyperlipidemia   her history and  most recent labs and thyroid  uptake and scan results were reviewed with her.   She previously declined treatment options for methimazole nor radioactive iodine thyroid  ablation. Her presentation currently is consistent with euthyroidism, will not be offered any treatment recommendations at this time.  I had a long discussion with her about relapsing potential of Graves' disease which may need intervention in the future and she will need thyroid  function test at least twice a year.   In light of her weight concern, and history of hyperlipidemia lifestyle medicine options were discussed with her.  - she acknowledges that there is a room for improvement in her food and drink choices. - Suggestion is made for her to avoid simple carbohydrates  from her diet including Cakes, Sweet Desserts, Ice Cream, Soda (diet and regular), Sweet Tea, Candies, Chips, Cookies, Store Bought Juices, Alcohol , Artificial Sweeteners,  Coffee Creamer, and Sugar-free Products, Lemonade. This will help patient to have more stable blood glucose profile and potentially avoid unintended weight gain.  The following Lifestyle Medicine recommendations according to American College of Lifestyle Medicine  [facility]) were discussed and and offered to patient and she  agrees to start the journey:  A. Whole Foods, Plant-Based Nutrition comprising of fruits and vegetables, plant-based proteins, whole-grain carbohydrates was discussed in detail with the patient.   A list for source of those nutrients were also provided to the patient.  Patient will use only  water or unsweetened tea for hydration. B.  The need to stay away from risky substances including alcohol, smoking; obtaining 7 to 9 hours of restorative sleep, at least 150 minutes of moderate intensity exercise weekly, the importance of healthy social connections,  and stress management techniques were discussed. C.  A full color page of  Calorie density of various food groups per pound  showing examples of each food groups was provided to the patient.  She is also interested in pharmacologic intervention.  I discussed and prescribed Zepbound  2.5 mg subcutaneously weekly to advance to the next higher doses per protocol as she tolerates.  She previously had normal A1c, will have a repeat A1c when she returns for a follow-up in 3 months. -Patient is advised to maintain close follow up with Early, Sara E, NP for primary care needs.   I spent  41  minutes in the care of the patient today including review of labs from Thyroid  Function, CMP, and other relevant labs ; imaging/biopsy records (current and previous including abstractions from other facilities); face-to-face time discussing  her lab results and symptoms, medications doses, her options of short and long term treatment based on the latest standards of care / guidelines;   and documenting the encounter.  Ernestene Blacker  participated in the discussions, expressed understanding, and voiced agreement with the above plans.  All questions were answered to her satisfaction. she is encouraged to contact clinic should she have any questions or concerns prior to her return visit.   Follow up plan: Return in about 3 months (around 07/08/2024) for Fasting Labs  in AM B4 8, A1c -NV.   Thank you for involving me in the care of this pleasant patient, and I will continue to update you with her progress.  Ranny Earl, MD [facility] Phone: [phone]  Fax: [phone]   04/07/2024, 1:48 PM  This note was partially dictated with voice recognition software. Similar sounding words can be transcribed inadequately or may not  be corrected upon review.

## 2024-04-07 NOTE — Patient Instructions (Signed)
                                       Advice for Weight Management  -For most of us  the best way to lose weight is by diet management. Generally speaking, diet management means consuming less calories intentionally which over time brings about progressive weight loss.  This can be achieved more effectively by avoiding ultra processed carbohydrates, processed meats, unhealthy fats.    It is critically important to know your numbers: how much calorie you are consuming and how much calorie you need. More importantly, our carbohydrates sources should be unprocessed naturally occurring  complex starch food items.  It is always important to balance nutrition also by  appropriate intake of proteins (mainly plant-based), healthy fats/oils, plenty of fruits and vegetables.   -The American College of Lifestyle Medicine (ACL M) recommends nutrition derived mostly from Whole Food, Plant Predominant Sources example an apple instead of applesauce or apple pie. Eat Plenty of vegetables, Mushrooms, fruits, Legumes, Whole Grains, Nuts, seeds in lieu of processed meats, processed snacks/pastries red meat, poultry, eggs.  Use only water or unsweetened tea for hydration.  The College also recommends the need to stay away from risky substances including alcohol, smoking; obtaining 7-9 hours of restorative sleep, at least 150 minutes of moderate intensity exercise weekly, importance of healthy social connections, and being mindful of stress and seek help when it is overwhelming.    -Sticking to a routine mealtime to eat 3 meals a day and avoiding unnecessary snacks is shown to have a big role in weight control. Under normal circumstances, the only time we burn stored energy is when we are hungry, so allow  some hunger to take place- hunger means no food between appropriate meal times, only water.  It is not advisable to starve.   -It is better to avoid simple carbohydrates including:  Cakes, Sweet Desserts, Ice Cream, Soda (diet and regular), Sweet Tea, Candies, Chips, Cookies, Store Bought Juices, Alcohol in Excess of  1-2 drinks a day, Lemonade,  Artificial Sweeteners, Doughnuts, Coffee Creamers, Sugar-free Products, etc, etc.  This is not a complete list...SABRA.    -Consulting with certified diabetes educators is proven to provide you with the most accurate and current information on diet.  Also, you may be  interested in discussing diet options/exchanges , we can schedule a visit with Penny Crumpton, RDN, CDE for individualized nutrition education.  -Exercise: If you are able: 30 -60 minutes a day ,4 days a week, or 150 minutes of moderate intensity exercise weekly.    The longer the better if tolerated.  Combine stretch, strength, and aerobic activities.  If you were told in the past that you have high risk for cardiovascular diseases, or if you are currently symptomatic, you may seek evaluation by your heart doctor prior to initiating moderate to intense exercise programs.

## 2024-04-18 ENCOUNTER — Telehealth: Payer: Self-pay | Admitting: "Endocrinology

## 2024-04-18 NOTE — Telephone Encounter (Signed)
 Pt notified office her insurance is requiring a prior auth for Zepbound .

## 2024-04-18 NOTE — Telephone Encounter (Signed)
 Pt left a VM asking for the nurse to call her. I am thinking this is about her Zepbound . It does require a PA and it is sitting in the fax que

## 2024-04-19 ENCOUNTER — Other Ambulatory Visit (HOSPITAL_COMMUNITY): Payer: Self-pay

## 2024-04-19 ENCOUNTER — Telehealth: Payer: Self-pay

## 2024-04-19 NOTE — Telephone Encounter (Signed)
 Insurance excludes Zepbound . Pt asked about Rybelsus or Phentermine.

## 2024-04-19 NOTE — Telephone Encounter (Signed)
 Pharmacy Patient Advocate Encounter   Received notification from Pt Calls Messages that prior authorization for Zepbound  2.5mg /0.76ml is required/requested.   Insurance verification completed.   The patient is insured through [employer] .   Per test claim: Product/Service Not Covered. Plan/Benefit Exclusion

## 2024-04-20 NOTE — Telephone Encounter (Signed)
 Any other pharmacologic suggestion to help with weight loss?

## 2024-04-20 NOTE — Telephone Encounter (Signed)
 Pt stated she would like to try Wegovy .

## 2024-04-21 ENCOUNTER — Other Ambulatory Visit: Payer: Self-pay | Admitting: "Endocrinology

## 2024-04-21 MED ORDER — WEGOVY 0.25 MG/0.5ML ~~LOC~~ SOAJ: 0.2500 mg | SUBCUTANEOUS | 0 refills | Status: DC

## 2024-04-27 ENCOUNTER — Telehealth: Payer: Self-pay

## 2024-04-27 ENCOUNTER — Other Ambulatory Visit (HOSPITAL_COMMUNITY): Payer: Self-pay

## 2024-04-27 NOTE — Telephone Encounter (Signed)
 Called pt, she stated she does not have [employer] as her insurance. She is insured with [facility] UMR/UHC PPO, ID # [id number], Group # [id number]

## 2024-04-27 NOTE — Telephone Encounter (Signed)
 Pharmacy Patient Advocate Encounter   Received notification from Pt Calls Messages that prior authorization for Wegovy  is required/requested.   Insurance verification completed.   The patient is insured through [employer] .   Per test claim: Product/Service Not Covered. Plan/Benefit Exclusion

## 2024-04-27 NOTE — Telephone Encounter (Signed)
 [place]govy . [place]

## 2024-04-28 ENCOUNTER — Other Ambulatory Visit (HOSPITAL_COMMUNITY): Payer: Self-pay

## 2024-04-28 NOTE — Telephone Encounter (Signed)
 Yes, that is the information we are using. It just populates differently in our system.

## 2024-06-19 ENCOUNTER — Ambulatory Visit: Payer: Self-pay | Admitting: Nurse Practitioner

## 2024-06-19 ENCOUNTER — Encounter: Payer: Self-pay | Admitting: Nurse Practitioner

## 2024-06-19 VITALS — BP 128/82 | HR 74 | Wt 188.4 lb

## 2024-06-19 DIAGNOSIS — Z6833 Body mass index (BMI) 33.0-33.9, adult: Secondary | ICD-10-CM

## 2024-06-19 DIAGNOSIS — E0590 Thyrotoxicosis, unspecified without thyrotoxic crisis or storm: Secondary | ICD-10-CM | POA: Diagnosis not present

## 2024-06-19 DIAGNOSIS — K580 Irritable bowel syndrome with diarrhea: Secondary | ICD-10-CM

## 2024-06-19 DIAGNOSIS — Z130 Encounter for screening for diseases of the blood and blood-forming organs and certain disorders involving the immune mechanism: Secondary | ICD-10-CM

## 2024-06-19 DIAGNOSIS — Z0000 Encounter for general adult medical examination without abnormal findings: Secondary | ICD-10-CM | POA: Diagnosis not present

## 2024-06-19 DIAGNOSIS — Z1321 Encounter for screening for nutritional disorder: Secondary | ICD-10-CM

## 2024-06-19 DIAGNOSIS — Z1329 Encounter for screening for other suspected endocrine disorder: Secondary | ICD-10-CM

## 2024-06-19 DIAGNOSIS — Z13228 Encounter for screening for other metabolic disorders: Secondary | ICD-10-CM

## 2024-06-19 DIAGNOSIS — Z113 Encounter for screening for infections with a predominantly sexual mode of transmission: Secondary | ICD-10-CM

## 2024-06-19 MED ORDER — RIFAXIMIN 550 MG PO TABS: 550.0000 mg | ORAL_TABLET | Freq: Three times a day (TID) | ORAL | 0 refills | Status: DC

## 2024-06-19 NOTE — Patient Instructions (Signed)
 You can take the Rifaximin  three times a day. We will keep our fingers crossed that we get insurance approval.      For all adult patients, I recommend A well balanced diet low in saturated fats, cholesterol, and moderation in carbohydrates.   This can be as simple as monitoring portion sizes and cutting back on sugary beverages such as soda and juice to start with.    Daily water consumption of at least 64 ounces.  Physical activity at least 180 minutes per week, if just starting out.   This can be as simple as taking the stairs instead of the elevator and walking 2-3 laps around the office  purposefully every day.   STD protection, partner selection, and regular testing if high risk.  Limited consumption of alcoholic beverages if alcohol is consumed.  For women, I recommend no more than 7 alcoholic beverages per week, spread out throughout the week.  Avoid binge drinking or consuming large quantities of alcohol in one setting.   Please let me know if you feel you may need help with reduction or quitting alcohol consumption.   Avoidance of nicotine, if used.  Please let me know if you feel you may need help with reduction or quitting nicotine use.   Daily mental health attention.  This can be in the form of 5 minute daily meditation, prayer, journaling, yoga, reflection, etc.   Purposeful attention to your emotions and mental state can significantly improve your overall wellbeing  and  Health.  Please know that I am here to help you with all of your health care goals and am happy to work with you to find a solution that works best for you.  The greatest advice I have received with any changes in life are to take it one step at a time, that even means if all you can focus on is the next 60 seconds, then do that and celebrate your victories.  With any changes in life, you will have set backs, and that is OK. The important thing to remember is, if you have a set back, it is not a  failure, it is an opportunity to try again!  Health Maintenance Recommendations Screening Testing Mammogram Every 1 -2 years based on history and risk factors Starting at age 10 Pap Smear Ages 21-39 every 3 years Ages 27-65 every 5 years with HPV testing More frequent testing may be required based on results and history Colon Cancer Screening Every 1-10 years based on test performed, risk factors, and history Starting at age 7 Bone Density Screening Every 2-10 years based on history Starting at age 45 for women Recommendations for men differ based on medication usage, history, and risk factors AAA Screening One time ultrasound Men 90-77 years old who have every smoked Lung Cancer Screening Low Dose Lung CT every 12 months Age 1-80 years with a 30 pack-year smoking history who still smoke or who have quit within the last 15 years  Screening Labs Routine  Labs: Complete Blood Count (CBC), Complete Metabolic Panel (CMP), Cholesterol (Lipid Panel) Every 6-12 months based on history and medications May be recommended more frequently based on current conditions or previous results Hemoglobin A1c Lab Every 3-12 months based on history and previous results Starting at age 42 or earlier with diagnosis of diabetes, high cholesterol, BMI >26, and/or risk factors Frequent monitoring for patients with diabetes to ensure blood sugar control Thyroid  Panel (TSH w/ T3 & T4) Every 6 months based on history,  symptoms, and risk factors May be repeated more often if on medication HIV One time testing for all patients 70 and older May be repeated more frequently for patients with increased risk factors or exposure Hepatitis C One time testing for all patients 53 and older May be repeated more frequently for patients with increased risk factors or exposure Gonorrhea, Chlamydia Every 12 months for all sexually active persons 13-24 years Additional monitoring may be recommended for those who are  considered high risk or who have symptoms PSA Men 47-67 years old with risk factors Additional screening may be recommended from age 38-69 based on risk factors, symptoms, and history  Vaccine Recommendations Tetanus Booster All adults every 10 years Flu Vaccine All patients 6 months and older every year COVID Vaccine All patients 12 years and older Initial dosing with booster May recommend additional booster based on age and health history HPV Vaccine 2 doses all patients age 4-26 Dosing may be considered for patients over 26 Shingles Vaccine (Shingrix) 2 doses all adults 55 years and older Pneumonia (Pneumovax 23) All adults 65 years and older May recommend earlier dosing based on health history Pneumonia (Prevnar 74) All adults 65 years and older Dosed 1 year after Pneumovax 23  Additional Screening, Testing, and Vaccinations may be recommended on an individualized basis based on family history, health history, risk factors, and/or exposure.

## 2024-06-19 NOTE — Progress Notes (Unsigned)
 Catheline Doing, DNP, AGNP-c [facility] [address] [place], [place] [address] Main Office [phone] VISIT TYPE: CPE on 06/19/2024 Today's Vitals   06/19/24 1528  BP: 128/82  Pulse: 74  Weight: 188 lb 6.4 oz (85.5 kg)   Body mass index is 33.37 kg/m. BP 128/82   Pulse 74   Wt 188 lb 6.4 oz (85.5 kg)   BMI 33.37 kg/m   Subjective:    Patient ID: Joyce Rice, female    DOB: 06-29-1986, 38 y.o.   MRN: [id number]  HPI: Anael is a 38 y/o female here today for her annual physical exam.  She is currently feeling well overall with no specific concerns at this time.   We briefly discussed her history of IBS and ongoing symptoms without relief. She has tried fodmap diet and elimination diet to help her symptoms, but continues to have abdominal cramping and diarrhea on a routine basis. No fevers or bloody diarrhea associated with this.   She has no thyroid  symptoms at this time.  Pertinent items are noted in HPI.  Most Recent Depression Screen:     06/19/2024    3:28 PM 03/26/2023   10:35 AM 10/01/2022    3:12 PM 02/10/2022    3:26 PM 01/24/2021   11:11 AM  Depression screen PHQ 2/9  Decreased Interest 0 0 0 0 0  Down, Depressed, Hopeless 0 0 0 0 0  PHQ - 2 Score 0 0 0 0 0   Most Recent Anxiety Screen:     06/08/2017   10:23 AM 05/11/2016    2:38 PM 02/26/2016    3:15 PM  GAD 7 : Generalized Anxiety Score  Nervous, Anxious, on Edge 0 0 0  Control/stop worrying 0 0 0  Worry too much - different things 0 0 0  Trouble relaxing 0 0 2  Restless 0 0 0  Easily annoyed or irritable 0 0 0  Afraid - awful might happen 0 0 0  Total GAD 7 Score 0 0 2   Most Recent Fall Screen:    06/19/2024    3:28 PM 03/26/2023   10:35 AM 10/01/2022    3:12 PM 02/10/2022    3:26 PM 01/24/2021   11:11 AM  Fall Risk   Falls in the past year? 0 0 0 1 1  Number falls in past yr: 0 0 0 0 0  Injury with Fall? 0 0 0 1 1  Comment    Pt broke her ankle   Risk for fall due to  : No Fall Risks No Fall Risks No Fall Risks No Fall Risks Other (Comment)  Follow up Falls evaluation completed Falls evaluation completed Falls evaluation completed Falls evaluation completed  Falls evaluation completed      Data saved with a previous flowsheet row definition    Past medical history, surgical history, medications, allergies, family history and social history reviewed with patient today and changes made to appropriate areas of the chart.  Past Medical History:  Past Medical History:  Diagnosis Date   Abdominal pain 10/08/2021   Bartholin cyst    Chlamydia    Chlamydia 12/29/2011   Azithromycin  given 12/29/11, vomited. Partner Tx per pt. Rx Doxy 01/29/12.    Diarrhea 10/08/2021   Headache    Hyperthyroidism    Hypothyroid    Infection    UTI   Irregular periods/menstrual cycles    PONV (postoperative nausea and vomiting)    Medications:  No current outpatient medications on file  prior to visit.   No current facility-administered medications on file prior to visit.   Surgical History:  Past Surgical History:  Procedure Laterality Date   COLONOSCOPY  10/14/2021   DILATION AND CURETTAGE OF UTERUS     INDUCED ABORTION     LAPAROSCOPIC TUBAL LIGATION Bilateral 05/26/2016   Procedure: LAPAROSCOPIC TUBAL LIGATION;  Surgeon: Winton Felt, MD;  Location: [facility];  Service: Gynecology;  Laterality: Bilateral;   Allergies:  Allergies  Allergen Reactions   Monistat [Miconazole] Rash    irritation   Family History:  Family History  Problem Relation Age of Onset   Hearing loss Neg Hx    Colon cancer Neg Hx    Esophageal cancer Neg Hx        Objective:    BP 128/82   Pulse 74   Wt 188 lb 6.4 oz (85.5 kg)   BMI 33.37 kg/m   Wt Readings from Last 3 Encounters:  06/19/24 188 lb 6.4 oz (85.5 kg)  04/07/24 187 lb 6.4 oz (85 kg)  01/08/24 175 lb (79.4 kg)    Physical Exam Vitals and nursing note reviewed.  Constitutional:      General: She is not in acute  distress.    Appearance: Normal appearance.  HENT:     Head: Normocephalic and atraumatic.     Right Ear: Hearing, tympanic membrane, ear canal and external ear normal.     Left Ear: Hearing, tympanic membrane, ear canal and external ear normal.     Nose: Nose normal.     Right Sinus: No maxillary sinus tenderness or frontal sinus tenderness.     Left Sinus: No maxillary sinus tenderness or frontal sinus tenderness.     Mouth/Throat:     Lips: Pink.     Mouth: Mucous membranes are moist.     Pharynx: Oropharynx is clear.  Eyes:     General: Lids are normal. Vision grossly intact.     Extraocular Movements: Extraocular movements intact.     Conjunctiva/sclera: Conjunctivae normal.     Pupils: Pupils are equal, round, and reactive to light.     Funduscopic exam:    Right eye: Red reflex present.        Left eye: Red reflex present.    Visual Fields: Right eye visual fields normal and left eye visual fields normal.  Neck:     Thyroid : No thyromegaly.     Vascular: No carotid bruit.  Cardiovascular:     Rate and Rhythm: Normal rate and regular rhythm.     Chest Wall: PMI is not displaced.     Pulses: Normal pulses.          Dorsalis pedis pulses are 2+ on the right side and 2+ on the left side.       Posterior tibial pulses are 2+ on the right side and 2+ on the left side.     Heart sounds: Normal heart sounds. No murmur heard. Pulmonary:     Effort: Pulmonary effort is normal. No respiratory distress.     Breath sounds: Normal breath sounds.  Abdominal:     General: Abdomen is flat. Bowel sounds are normal. There is no distension.     Palpations: Abdomen is soft. There is no hepatomegaly, splenomegaly or mass.     Tenderness: There is abdominal tenderness. There is no right CVA tenderness, left CVA tenderness, guarding or rebound.     Comments: Generalized tenderness over abdomen without distention or masses.   Musculoskeletal:  General: Normal range of motion.      Cervical back: Full passive range of motion without pain, normal range of motion and neck supple. No tenderness.     Right lower leg: No edema.     Left lower leg: No edema.  Feet:     Left foot:     Toenail Condition: Left toenails are normal.  Lymphadenopathy:     Cervical: No cervical adenopathy.     Upper Body:     Right upper body: No supraclavicular adenopathy.     Left upper body: No supraclavicular adenopathy.  Skin:    General: Skin is warm and dry.     Capillary Refill: Capillary refill takes less than 2 seconds.     Nails: There is no clubbing.  Neurological:     General: No focal deficit present.     Mental Status: She is alert and oriented to person, place, and time.     GCS: GCS eye subscore is 4. GCS verbal subscore is 5. GCS motor subscore is 6.     Sensory: Sensation is intact.     Motor: Motor function is intact.     Coordination: Coordination is intact.     Gait: Gait is intact.     Deep Tendon Reflexes: Reflexes are normal and symmetric.  Psychiatric:        Attention and Perception: Attention normal.        Mood and Affect: Mood normal.        Speech: Speech normal.        Behavior: Behavior normal. Behavior is cooperative.        Thought Content: Thought content normal.        Cognition and Memory: Cognition and memory normal.        Judgment: Judgment normal.      Results for orders placed or performed in visit on 06/19/24  CBC with Differential/Platelet   Collection Time: 06/19/24  4:47 PM  Result Value Ref Range   WBC 9.4 3.4 - 10.8 x10E3/uL   RBC 4.42 3.77 - 5.28 x10E6/uL   Hemoglobin 13.2 11.1 - 15.9 g/dL   Hematocrit 59.7 65.9 - 46.6 %   MCV 91 79 - 97 fL   MCH 29.9 26.6 - 33.0 pg   MCHC 32.8 31.5 - 35.7 g/dL   RDW 88.2 88.2 - 84.5 %   Platelets 311 150 - 450 x10E3/uL   Neutrophils 61 Not Estab. %   Lymphs 31 Not Estab. %   Monocytes 6 Not Estab. %   Eos 2 Not Estab. %   Basos 0 Not Estab. %   Neutrophils Absolute 5.7 1.4 - 7.0  x10E3/uL   Lymphocytes Absolute 2.9 0.7 - 3.1 x10E3/uL   Monocytes Absolute 0.6 0.1 - 0.9 x10E3/uL   EOS (ABSOLUTE) 0.1 0.0 - 0.4 x10E3/uL   Basophils Absolute 0.0 0.0 - 0.2 x10E3/uL   Immature Granulocytes 0 Not Estab. %   Immature Grans (Abs) 0.0 0.0 - 0.1 x10E3/uL  CMP14+EGFR   Collection Time: 06/19/24  4:47 PM  Result Value Ref Range   Glucose 106 (H) 70 - 99 mg/dL   BUN 10 6 - 20 mg/dL   Creatinine, Ser 9.16 0.57 - 1.00 mg/dL   eGFR 94 >40 fO/fpw/8.26   BUN/Creatinine Ratio 12 9 - 23   Sodium 138 134 - 144 mmol/L   Potassium 3.7 3.5 - 5.2 mmol/L   Chloride 100 96 - 106 mmol/L   CO2 22 20 - 29 mmol/L  Calcium 10.0 8.7 - 10.2 mg/dL   Total Protein 7.6 6.0 - 8.5 g/dL   Albumin 4.6 3.9 - 4.9 g/dL   Globulin, Total 3.0 1.5 - 4.5 g/dL   Bilirubin Total 0.3 0.0 - 1.2 mg/dL   Alkaline Phosphatase 79 41 - 116 IU/L   AST 19 0 - 40 IU/L   ALT 14 0 - 32 IU/L  Lipid panel   Collection Time: 06/19/24  4:47 PM  Result Value Ref Range   Cholesterol, Total 250 (H) 100 - 199 mg/dL   Triglycerides 852 0 - 149 mg/dL   HDL 72 >60 mg/dL   VLDL Cholesterol Cal 26 5 - 40 mg/dL   LDL Chol Calc (NIH) 847 (H) 0 - 99 mg/dL   Chol/HDL Ratio 3.5 0.0 - 4.4 ratio  STI Profile, CT/NG/TV   Collection Time: 06/19/24  4:47 PM  Result Value Ref Range   Hepatitis B Surface Ag Negative Negative   Hep B Surface Ab, Qual Non Reactive    Hep B Core Total Ab Negative Negative   Rfx to HBc IgM Comment    Interpretation Comment    HCV Ab Non Reactive Non Reactive   RPR Ser Ql Non Reactive Non Reactive   HIV Screen 4th Generation wRfx Non Reactive Non Reactive   Chlamydia by NAA Negative Negative   Gonococcus by NAA Negative Negative   Trich vag by NAA Negative Negative  Interpretation:   Collection Time: 06/19/24  4:47 PM  Result Value Ref Range   HCV Interp 1: Comment        Assessment & Plan:   Problem List Items Addressed This Visit     Irritable bowel syndrome with diarrhea   Chronic  abdominal pain, cramping, and diarrhea significantly impacting QOL and daily activities. Fodmap diet, probiotics, elimination diet, IB guard, and other treatment options have not been successful for management. We discussed the option of trying Rifaximin  for management and she is interested in this. I discussed that insurance approval can be difficult, but given that she has tried numerous treatments, it is worth looking into coverage.  - samples provided today to start Rifaximin  three times a day. - prescription  sent for approval      Relevant Medications   rifaximin  (XIFAXAN ) 550 MG TABS tablet   Hyperthyroidism   Chronic. No currently on medication for management. Currently followed with Dr. Lenis who has labs ordered at this time. Will provide supportive care.       Annual physical exam - Primary   CPE completed today. Review of HM activities and recommendations discussed and provided on AVS. Anticipatory guidance, diet, and exercise recommendations provided. Medications, allergies, and hx reviewed and updated as necessary. Orders placed as listed below.  Plan: - Labs ordered. Will make changes as necessary based on results.  - I will review these results and send recommendations via MyChart or a telephone call.  - F/U with CPE in 1 year or sooner for acute/chronic health needs as directed.        BMI 33.0-33.9,adult   BMI 33.37 at visit today. Recommend low carbohydrate, low fat diet high in fiber and healthy lean proteins. Avoid skipping meals. Recommend small portions spread throughout the day to help with normal blood sugar levels and reduce risk of fluctuations high or low. Recommend exercise of at least 20 minutes every day with a brisk walk or similar, in addition to normal walking with work or regular activity.  Relevant Orders   CBC with Differential/Platelet (Completed)   CMP14+EGFR (Completed)   Lipid panel (Completed)   Other Visit Diagnoses       Screen for STD  (sexually transmitted disease)       Relevant Orders   STI Profile   STI Profile, CT/NG/TV (Completed)   Interpretation: (Completed)     Screening for endocrine, nutritional, metabolic and immunity disorder       Relevant Orders   CBC with Differential/Platelet (Completed)   CMP14+EGFR (Completed)   Lipid panel (Completed)          Follow up plan: Return in about 1 year (around 06/19/2025) for CPE.  NEXT PREVENTATIVE PHYSICAL DUE IN 1 YEAR.  PATIENT COUNSELING PROVIDED FOR ALL ADULT PATIENTS: A well balanced diet low in saturated fats, cholesterol, and moderation in carbohydrates.  This can be as simple as monitoring portion sizes and cutting back on sugary beverages such as soda and juice to start with.    Daily water consumption of at least 64 ounces.  Physical activity at least 180 minutes per week.  If just starting out, start 10 minutes a day and work your way up.   This can be as simple as taking the stairs instead of the elevator and walking 2-3 laps around the office  purposefully every day.   STD protection, partner selection, and regular testing if high risk.  Limited consumption of alcoholic beverages if alcohol is consumed. For men, I recommend no more than 14 alcoholic beverages per week, spread out throughout the week (max 2 per day). Avoid binge drinking or consuming large quantities of alcohol in one setting.  Please let me know if you feel you may need help with reduction or quitting alcohol consumption.   Avoidance of nicotine, if used. Please let me know if you feel you may need help with reduction or quitting nicotine use.   Daily mental health attention. This can be in the form of 5 minute daily meditation, prayer, journaling, yoga, reflection, etc.  Purposeful attention to your emotions and mental state can significantly improve your overall wellbeing  and  Health.  Please know that I am here to help you with all of your health care goals and am  happy to work with you to find a solution that works best for you.  The greatest advice I have received with any changes in life are to take it one step at a time, that even means if all you can focus on is the next 60 seconds, then do that and celebrate your victories.  With any changes in life, you will have set backs, and that is OK. The important thing to remember is, if you have a set back, it is not a failure, it is an opportunity to try again! Screening Testing Mammogram Every 1 -2 years based on history and risk factors Starting at age 60 Pap Smear Ages 21-39 every 3 years Ages 17-65 every 5 years with HPV testing More frequent testing may be required based on results and history Colon Cancer Screening Every 1-10 years based on test performed, risk factors, and history Starting at age 40 Bone Density Screening Every 2-10 years based on history Starting at age 57 for women Recommendations for men differ based on medication usage, history, and risk factors AAA Screening One time ultrasound Men 65-38 years old who have every smoked Lung Cancer Screening Low Dose Lung CT every 12 months Age 49-80 years with a 30 pack-year smoking history  who still smoke or who have quit within the last 15 years   Screening Labs Routine  Labs: Complete Blood Count (CBC), Complete Metabolic Panel (CMP), Cholesterol (Lipid Panel) Every 6-12 months based on history and medications May be recommended more frequently based on current conditions or previous results Hemoglobin A1c Lab Every 3-12 months based on history and previous results Starting at age 25 or earlier with diagnosis of diabetes, high cholesterol, BMI >26, and/or risk factors Frequent monitoring for patients with diabetes to ensure blood sugar control Thyroid  Panel (TSH) Every 6 months based on history, symptoms, and risk factors May be repeated more often if on medication HIV One time testing for all patients 59 and older May be  repeated more frequently for patients with increased risk factors or exposure Hepatitis C One time testing for all patients 51 and older May be repeated more frequently for patients with increased risk factors or exposure Gonorrhea, Chlamydia Every 12 months for all sexually active persons 13-24 years Additional monitoring may be recommended for those who are considered high risk or who have symptoms Every 12 months for any woman on birth control, regardless of sexual activity PSA Men 72-8 years old with risk factors Additional screening may be recommended from age 71-69 based on risk factors, symptoms, and history  Vaccine Recommendations Tetanus Booster All adults every 10 years Flu Vaccine All patients 6 months and older every year COVID Vaccine All patients 12 years and older Initial dosing with booster May recommend additional booster based on age and health history HPV Vaccine 2 doses all patients age 55-26 Dosing may be considered for patients over 26 Shingles Vaccine (Shingrix) 2 doses all adults 55 years and older Pneumonia (Pneumovax 23) All adults 65 years and older May recommend earlier dosing based on health history One year apart from Prevnar 13 Pneumonia (Prevnar 107) All adults 65 years and older Dosed 1 year after Pneumovax 23 Pneumonia (Prevnar 20) One time alternative to the two dosing of 13 and 23 For all adults with initial dose of 23, 20 is recommended 1 year later For all adults with initial dose of 13, 23 is still recommended as second option 1 year later

## 2024-06-20 LAB — CMP14+EGFR
ALT: 14 IU/L (ref 0–32)
AST: 19 IU/L (ref 0–40)
Albumin: 4.6 g/dL (ref 3.9–4.9)
Alkaline Phosphatase: 79 IU/L (ref 41–116)
BUN/Creatinine Ratio: 12 (ref 9–23)
BUN: 10 mg/dL (ref 6–20)
Bilirubin Total: 0.3 mg/dL (ref 0.0–1.2)
CO2: 22 mmol/L (ref 20–29)
Calcium: 10 mg/dL (ref 8.7–10.2)
Chloride: 100 mmol/L (ref 96–106)
Creatinine, Ser: 0.83 mg/dL (ref 0.57–1.00)
Globulin, Total: 3 g/dL (ref 1.5–4.5)
Glucose: 106 mg/dL — ABNORMAL HIGH (ref 70–99)
Potassium: 3.7 mmol/L (ref 3.5–5.2)
Sodium: 138 mmol/L (ref 134–144)
Total Protein: 7.6 g/dL (ref 6.0–8.5)
eGFR: 94 mL/min/1.73

## 2024-06-20 LAB — CBC WITH DIFFERENTIAL/PLATELET
Basophils Absolute: 0 x10E3/uL (ref 0.0–0.2)
Basos: 0 %
EOS (ABSOLUTE): 0.1 x10E3/uL (ref 0.0–0.4)
Eos: 2 %
Hematocrit: 40.2 % (ref 34.0–46.6)
Hemoglobin: 13.2 g/dL (ref 11.1–15.9)
Immature Grans (Abs): 0 x10E3/uL (ref 0.0–0.1)
Immature Granulocytes: 0 %
Lymphocytes Absolute: 2.9 x10E3/uL (ref 0.7–3.1)
Lymphs: 31 %
MCH: 29.9 pg (ref 26.6–33.0)
MCHC: 32.8 g/dL (ref 31.5–35.7)
MCV: 91 fL (ref 79–97)
Monocytes Absolute: 0.6 x10E3/uL (ref 0.1–0.9)
Monocytes: 6 %
Neutrophils Absolute: 5.7 x10E3/uL (ref 1.4–7.0)
Neutrophils: 61 %
Platelets: 311 x10E3/uL (ref 150–450)
RBC: 4.42 x10E6/uL (ref 3.77–5.28)
RDW: 11.7 % (ref 11.7–15.4)
WBC: 9.4 x10E3/uL (ref 3.4–10.8)

## 2024-06-20 LAB — STI PROFILE, CT/NG/TV
Chlamydia by NAA: NEGATIVE
Gonococcus by NAA: NEGATIVE
HCV Ab: NONREACTIVE
HIV Screen 4th Generation wRfx: NONREACTIVE
Hep B Core Total Ab: NEGATIVE
Hep B Surface Ab, Qual: NONREACTIVE
Hepatitis B Surface Ag: NEGATIVE
RPR Ser Ql: NONREACTIVE
Trich vag by NAA: NEGATIVE

## 2024-06-20 LAB — LIPID PANEL
Chol/HDL Ratio: 3.5 ratio (ref 0.0–4.4)
Cholesterol, Total: 250 mg/dL — ABNORMAL HIGH (ref 100–199)
HDL: 72 mg/dL (ref >39)
LDL Chol Calc (NIH): 152 mg/dL — ABNORMAL HIGH (ref 0–99)
Triglycerides: 147 mg/dL (ref 0–149)
VLDL Cholesterol Cal: 26 mg/dL (ref 5–40)

## 2024-06-20 LAB — HCV INTERPRETATION

## 2024-06-21 ENCOUNTER — Ambulatory Visit: Payer: Self-pay | Admitting: Nurse Practitioner

## 2024-06-23 NOTE — Assessment & Plan Note (Addendum)
 Chronic. No currently on medication for management. Currently followed with Dr. Lenis who has labs ordered at this time. Will provide supportive care.

## 2024-06-23 NOTE — Assessment & Plan Note (Signed)
 BMI 33.37 at visit today. Recommend low carbohydrate, low fat diet high in fiber and healthy lean proteins. Avoid skipping meals. Recommend small portions spread throughout the day to help with normal blood sugar levels and reduce risk of fluctuations high or low. Recommend exercise of at least 20 minutes every day with a brisk walk or similar, in addition to normal walking with work or regular activity.

## 2024-06-23 NOTE — Assessment & Plan Note (Signed)
 Chronic abdominal pain, cramping, and diarrhea significantly impacting QOL and daily activities. Fodmap diet, probiotics, elimination diet, IB guard, and other treatment options have not been successful for management. We discussed the option of trying Rifaximin  for management and she is interested in this. I discussed that insurance approval can be difficult, but given that she has tried numerous treatments, it is worth looking into coverage.  - samples provided today to start Rifaximin  three times a day. - prescription  sent for approval

## 2024-06-23 NOTE — Assessment & Plan Note (Signed)

## 2024-06-28 ENCOUNTER — Other Ambulatory Visit: Payer: Self-pay | Admitting: Nurse Practitioner

## 2024-06-28 DIAGNOSIS — Z6833 Body mass index (BMI) 33.0-33.9, adult: Secondary | ICD-10-CM

## 2024-06-28 MED ORDER — PHENTERMINE HCL 37.5 MG PO CAPS: 37.5000 mg | ORAL_CAPSULE | ORAL | 1 refills | Status: AC

## 2024-07-01 LAB — COMPREHENSIVE METABOLIC PANEL WITH GFR
AG Ratio: 1.5 (calc) (ref 1.0–2.5)
ALT: 14 U/L (ref 6–29)
AST: 17 U/L (ref 10–30)
Albumin: 4.5 g/dL (ref 3.6–5.1)
Alkaline phosphatase (APISO): 62 U/L (ref 31–125)
BUN: 9 mg/dL (ref 7–25)
CO2: 26 mmol/L (ref 20–32)
Calcium: 9.2 mg/dL (ref 8.6–10.2)
Chloride: 105 mmol/L (ref 98–110)
Creat: 0.79 mg/dL (ref 0.50–0.97)
Globulin: 3 g/dL (ref 1.9–3.7)
Glucose, Bld: 93 mg/dL (ref 65–99)
Potassium: 3.8 mmol/L (ref 3.5–5.3)
Sodium: 140 mmol/L (ref 135–146)
Total Bilirubin: 0.5 mg/dL (ref 0.2–1.2)
Total Protein: 7.5 g/dL (ref 6.1–8.1)
eGFR: 99 mL/min/1.73m2 (ref >=60)

## 2024-07-01 LAB — TSH: TSH: 1.31 m[IU]/L

## 2024-07-01 LAB — LIPID PANEL
Cholesterol: 229 mg/dL — ABNORMAL HIGH (ref <200)
HDL: 67 mg/dL (ref >=50)
LDL Cholesterol (Calc): 144 mg/dL — ABNORMAL HIGH
Non-HDL Cholesterol (Calc): 162 mg/dL — ABNORMAL HIGH (ref <130)
Total CHOL/HDL Ratio: 3.4 (calc) (ref <5.0)
Triglycerides: 80 mg/dL (ref <150)

## 2024-07-01 LAB — T3: T3, Total: 107 ng/dL (ref 76–181)

## 2024-07-01 LAB — T4, FREE: Free T4: 1.2 ng/dL (ref 0.8–1.8)

## 2024-07-10 ENCOUNTER — Encounter: Payer: Self-pay | Admitting: "Endocrinology

## 2024-07-10 ENCOUNTER — Ambulatory Visit (INDEPENDENT_AMBULATORY_CARE_PROVIDER_SITE_OTHER): Admitting: "Endocrinology

## 2024-07-10 VITALS — BP 102/66 | HR 68 | Ht 63.0 in | Wt 184.2 lb

## 2024-07-10 DIAGNOSIS — E6609 Other obesity due to excess calories: Secondary | ICD-10-CM

## 2024-07-10 DIAGNOSIS — R7303 Prediabetes: Secondary | ICD-10-CM | POA: Diagnosis not present

## 2024-07-10 DIAGNOSIS — Z6833 Body mass index (BMI) 33.0-33.9, adult: Secondary | ICD-10-CM | POA: Diagnosis not present

## 2024-07-10 DIAGNOSIS — E782 Mixed hyperlipidemia: Secondary | ICD-10-CM | POA: Insufficient documentation

## 2024-07-10 DIAGNOSIS — E66811 Obesity, class 1: Secondary | ICD-10-CM | POA: Diagnosis not present

## 2024-07-10 LAB — POCT GLYCOSYLATED HEMOGLOBIN (HGB A1C): HbA1c, POC (controlled diabetic range): 5.7 % (ref 0.0–7.0)

## 2024-07-10 NOTE — Progress Notes (Signed)
 07/10/2024      Endocrinology follow-up note    Subjective:    Patient ID: Joyce Rice, female    DOB: 03/09/1986, PCP Early, Camie BRAVO, NP.   Past Medical History:  Diagnosis Date   Abdominal pain 10/08/2021   Bartholin cyst    Chlamydia    Chlamydia 12/29/2011   Azithromycin  given 12/29/11, vomited. Partner Tx per pt. Rx Doxy 01/29/12.    Diarrhea 10/08/2021   Headache    Hyperthyroidism    Hypothyroid    Infection    UTI   Irregular periods/menstrual cycles    PONV (postoperative nausea and vomiting)     Past Surgical History:  Procedure Laterality Date   COLONOSCOPY  10/14/2021   DILATION AND CURETTAGE OF UTERUS     INDUCED ABORTION     LAPAROSCOPIC TUBAL LIGATION Bilateral 05/26/2016   Procedure: LAPAROSCOPIC TUBAL LIGATION;  Surgeon: Winton Felt, MD;  Location: [facility];  Service: Gynecology;  Laterality: Bilateral;    Social History   Socioeconomic History   Marital status: Single    Spouse name: Not on file   Number of children: Not on file   Years of education: Not on file   Highest education level: Not on file  Occupational History   Not on file  Tobacco Use   Smoking status: Never   Smokeless tobacco: Never  Vaping Use   Vaping status: Never Used  Substance and Sexual Activity   Alcohol use: Yes    Comment: once or twice a year   Drug use: No   Sexual activity: Not Currently    Birth control/protection: Surgical  Other Topics Concern   Not on file  Social History Narrative   Not on file   Social Drivers of Health   Financial Resource Strain: Low Risk  (06/19/2024)   Overall Financial Resource Strain (CARDIA)    Difficulty of Paying Living Expenses: Not hard at all  Food Insecurity: No Food Insecurity (06/19/2024)   Hunger Vital Sign    Worried About Running Out of Food in the Last Year: Never true    Ran Out of Food in the Last Year: Never true  Transportation Needs: No Transportation Needs (06/19/2024)   PRAPARE -  Administrator, Civil Service (Medical): No    Lack of Transportation (Non-Medical): No  Physical Activity: Inactive (06/19/2024)   Exercise Vital Sign    Days of Exercise per Week: 0 days    Minutes of Exercise per Session: 0 min  Stress: No Stress Concern Present (06/19/2024)   [employer] of Occupational Health - Occupational Stress Questionnaire    Feeling of Stress: Not at all  Social Connections: Socially Isolated (06/19/2024)   Social Connection and Isolation Panel    Frequency of Communication with Friends and Family: Three times a week    Frequency of Social Gatherings with Friends and Family: Twice a week    Attends Religious Services: Never    Database Administrator or Organizations: No    Attends Engineer, Structural: Never    Marital Status: Never married    Family History  Problem Relation Age of Onset   Hearing loss Neg Hx    Colon cancer Neg Hx    Esophageal cancer Neg Hx     Outpatient Encounter Medications as of 07/10/2024  Medication Sig   phentermine  37.5 MG capsule Take 1 capsule (37.5 mg total) by mouth every morning. (Patient not taking: Reported on 07/10/2024)  rifaximin  (XIFAXAN ) 550 MG TABS tablet Take 1 tablet (550 mg total) by mouth 3 (three) times daily. (Patient not taking: Reported on 07/10/2024)   No facility-administered encounter medications on file as of 07/10/2024.    ALLERGIES: Allergies  Allergen Reactions   Monistat [Miconazole] Rash    irritation    VACCINATION STATUS: Immunization History  Administered Date(s) Administered   Hepatitis B, ADULT 07/14/2020     HPI  [name] is 38 y.o. female who presents today with a medical history as above. she is being seen in follow-up after she was seen in consultation for hyperthyroidism requested by Early, Camie BRAVO, NP.   Despite presentation with high uptake of 54% confirming Graves' disease, patient required ablative treatment subsequently showing evidence  of resolution with euthyroid state.    During her visit, major concern was weight gain for which she was considered for GLP-1 receptor agonist prescription.  However her insurance did not provide coverage for this prescription. Her PCP gave her a prescription for phentermine  37.5 mg daily, seh did not start th anyis medication planning to start after the holidays.  She denies palpitations, tremors, heat intolerance.  She denies dysphagia, shortness of breath, no voice change.  She is not following any particular diet and exercise program at this time.   she denies family history of thyroid  dysfunction , nor thyroid  malignancy.    she denies personal history of goiter. she is not on any anti-thyroid  medications nor on any thyroid  hormone supplements. She declined intervention for dyslipidemia.  She is not on any particular diet or exercise regimen at this time.  Her point-of-care A1c was 5.7% today indicating prediabetes.                           Review of systems  Constitutional: + Fluctuating body weight, - subjective hyperthermia Eyes: no blurry vision, - xerophthalmia ENT: no sore throat, no nodules palpated in throat, no dysphagia/odynophagia, nor hoarseness    Objective:    BP 102/66   Pulse 68   Ht 5' 3 (1.6 m)   Wt 184 lb 3.2 oz (83.6 kg)   BMI 32.63 kg/m   Wt Readings from Last 3 Encounters:  07/10/24 184 lb 3.2 oz (83.6 kg)  06/19/24 188 lb 6.4 oz (85.5 kg)  04/07/24 187 lb 6.4 oz (85 kg)                                                Physical exam  Constitutional: Body mass index is 32.63 kg/m., not in acute distress, stable state of mind Eyes: PERRLA, EOMI, - exophthalmos    CMP     Component Value Date/Time   NA 140 06/30/2024 1139   NA 138 06/19/2024 1647   K 3.8 06/30/2024 1139   CL 105 06/30/2024 1139   CO2 26 06/30/2024 1139   GLUCOSE 93 06/30/2024 1139   BUN 9 06/30/2024 1139   BUN 10 06/19/2024 1647   CREATININE 0.79 06/30/2024 1139   CALCIUM  9.2 06/30/2024 1139   PROT 7.5 06/30/2024 1139   PROT 7.6 06/19/2024 1647   ALBUMIN 4.6 06/19/2024 1647   AST 17 06/30/2024 1139   ALT 14 06/30/2024 1139   ALKPHOS 79 06/19/2024 1647   BILITOT 0.5 06/30/2024 1139   BILITOT 0.3 06/19/2024 1647  GFRNONAA >60 09/08/2021 0855   GFRNONAA >89 11/03/2013 1112   GFRAA >60 01/22/2020 1253   GFRAA >89 11/03/2013 1112     CBC    Component Value Date/Time   WBC 9.4 06/19/2024 1647   WBC 6.4 12/01/2021 1505   RBC 4.42 06/19/2024 1647   RBC 4.18 12/01/2021 1505   HGB 13.2 06/19/2024 1647   HCT 40.2 06/19/2024 1647   PLT 311 06/19/2024 1647   MCV 91 06/19/2024 1647   MCH 29.9 06/19/2024 1647   MCH 30.5 09/08/2021 0855   MCHC 32.8 06/19/2024 1647   MCHC 33.7 12/01/2021 1505   RDW 11.7 06/19/2024 1647   LYMPHSABS 2.9 06/19/2024 1647   MONOABS 0.5 12/01/2021 1505   EOSABS 0.1 06/19/2024 1647   BASOSABS 0.0 06/19/2024 1647     Diabetic Labs (most recent): Lab Results  Component Value Date   HGBA1C 5.7 07/10/2024   HGBA1C 5.5 03/26/2023   HGBA1C 5.5 11/24/2022    Lipid Panel     Component Value Date/Time   CHOL 229 (H) 06/30/2024 1139   CHOL 250 (H) 06/19/2024 1647   TRIG 80 06/30/2024 1139   HDL 67 06/30/2024 1139   HDL 72 06/19/2024 1647   CHOLHDL 3.4 06/30/2024 1139   LDLCALC 144 (H) 06/30/2024 1139   LABVLDL 26 06/19/2024 1647     Lab Results  Component Value Date   TSH 1.31 06/30/2024   TSH 1.19 03/31/2024   TSH 1.700 03/26/2023   TSH 2.190 10/01/2022   TSH <0.005 (L) 04/09/2022   TSH <0.005 (L) 03/12/2022   TSH <0.005 (L) 03/04/2022   TSH 2.292 12/11/2020   FREET4 1.2 06/30/2024   FREET4 1.1 03/31/2024   FREET4 1.08 10/01/2022   FREET4 3.04 (H) 03/12/2022   FREET4 3.49 (H) 03/04/2022     Assessment & Plan:   1. Hyperthyroidism/ Graves' disease-resolved 2.  Obesity 3.  Hyperlipidemia   her history and most recent labs and thyroid  uptake and scan results were reviewed with her.   She previously  declined treatment options for methimazole nor radioactive iodine thyroid  ablation. Her presentation currently is consistent with euthyroidism, will not be offered any treatment recommendations at this time.  I had a long discussion with her about relapsing potential of Graves' disease which may need intervention in the future and she will need thyroid  function test at least twice a year.   In light of her weight concern, and history of hyperlipidemia, and now prediabetes lifestyle medicine options were discussed with her.  She is still wishes to avoid intervention with statins.  - she acknowledges that there is a room for improvement in her food and drink choices. - Suggestion is made for her to avoid simple carbohydrates  from her diet including Cakes, Sweet Desserts, Ice Cream, Soda (diet and regular), Sweet Tea, Candies, Chips, Cookies, Store Bought Juices, Alcohol , Artificial Sweeteners,  Coffee Creamer, and Sugar-free Products, Lemonade. This will help patient to have more stable blood glucose profile and potentially avoid unintended weight gain.  The following Lifestyle Medicine recommendations according to American College of Lifestyle Medicine  [facility]) were discussed and and offered to patient and she  agrees to start the journey:  A. Whole Foods, Plant-Based Nutrition comprising of fruits and vegetables, plant-based proteins, whole-grain carbohydrates was discussed in detail with the patient.   A list for source of those nutrients were also provided to the patient.  Patient will use only water or unsweetened tea for hydration. B.  The  need to stay away from risky substances including alcohol, smoking; obtaining 7 to 9 hours of restorative sleep, at least 150 minutes of moderate intensity exercise weekly, the importance of healthy social connections,  and stress management techniques were discussed. C.  A full color page of  Calorie density of various food groups per pound showing examples of  each food groups was provided to the patient.  -Her insurance does not provide coverage for GLP-1 receptor agonist for weight loss.  She has a prescription for phentermine  37.5 mg p.o. daily.  She is planning to start with this medication after the holidays, advised not to take it more than 4 weeks.   -Patient is advised to maintain close follow up with Early, Sara E, NP for primary care needs.  I spent  22  minutes in the care of the patient today including review of labs from Thyroid  Function, CMP, and other relevant labs ; imaging/biopsy records (current and previous including abstractions from other facilities); face-to-face time discussing  her lab results and symptoms, medications doses, her options of short and long term treatment based on the latest standards of care / guidelines;   and documenting the encounter.  Joyce Rice  participated in the discussions, expressed understanding, and voiced agreement with the above plans.  All questions were answered to her satisfaction. she is encouraged to contact clinic should she have any questions or concerns prior to her return visit.   Follow up plan: Return in about 6 months (around 01/07/2025) for Fasting Labs  in AM B4 8, A1c -NV.   Thank you for involving me in the care of this pleasant patient, and I will continue to update you with her progress.  Ranny Earl, MD [facility] Phone: [phone]  Fax: [phone]   07/10/2024, 3:26 PM  This note was partially dictated with voice recognition software. Similar sounding words can be transcribed inadequately or may not  be corrected upon review.

## 2024-08-18 ENCOUNTER — Encounter: Payer: Self-pay | Admitting: Nurse Practitioner

## 2024-09-02 ENCOUNTER — Other Ambulatory Visit: Payer: Self-pay

## 2024-09-02 ENCOUNTER — Emergency Department

## 2024-09-02 ENCOUNTER — Emergency Department
Admission: EM | Admit: 2024-09-02 | Discharge: 2024-09-02 | Disposition: A | Discharge status: Home / Self Care | Attending: Emergency Medicine | Admitting: Emergency Medicine

## 2024-09-02 DIAGNOSIS — R1021 Pelvic and perineal pain right side: Secondary | ICD-10-CM | POA: Diagnosis present

## 2024-09-02 DIAGNOSIS — D649 Anemia, unspecified: Secondary | ICD-10-CM | POA: Diagnosis not present

## 2024-09-02 DIAGNOSIS — N9489 Other specified conditions associated with female genital organs and menstrual cycle: Secondary | ICD-10-CM | POA: Insufficient documentation

## 2024-09-02 DIAGNOSIS — R000 Tachycardia, unspecified: Secondary | ICD-10-CM | POA: Diagnosis not present

## 2024-09-02 DIAGNOSIS — E039 Hypothyroidism, unspecified: Secondary | ICD-10-CM | POA: Insufficient documentation

## 2024-09-02 LAB — CBC WITH DIFFERENTIAL/PLATELET
Abs Immature Granulocytes: 0.02 K/uL (ref 0.00–0.07)
Basophils Absolute: 0 K/uL (ref 0.0–0.1)
Basophils Relative: 0 %
Eosinophils Absolute: 0.1 K/uL (ref 0.0–0.5)
Eosinophils Relative: 1 %
HCT: 35.6 % — ABNORMAL LOW (ref 36.0–46.0)
Hemoglobin: 11.9 g/dL — ABNORMAL LOW (ref 12.0–15.0)
Immature Granulocytes: 0 %
Lymphocytes Relative: 28 %
Lymphs Abs: 2.8 K/uL (ref 0.7–4.0)
MCH: 29.8 pg (ref 26.0–34.0)
MCHC: 33.4 g/dL (ref 30.0–36.0)
MCV: 89 fL (ref 80.0–100.0)
Monocytes Absolute: 0.6 K/uL (ref 0.1–1.0)
Monocytes Relative: 6 %
Neutro Abs: 6.3 K/uL (ref 1.7–7.7)
Neutrophils Relative %: 65 %
Platelets: 291 K/uL (ref 150–400)
RBC: 4 MIL/uL (ref 3.87–5.11)
RDW: 11.8 % (ref 11.5–15.5)
WBC: 9.8 K/uL (ref 4.0–10.5)
nRBC: 0 % (ref 0.0–0.2)

## 2024-09-02 LAB — COMPREHENSIVE METABOLIC PANEL WITH GFR
ALT: 9 U/L (ref 0–44)
AST: 15 U/L (ref 15–41)
Albumin: 4.2 g/dL (ref 3.5–5.0)
Alkaline Phosphatase: 84 U/L (ref 38–126)
Anion gap: 9 (ref 5–15)
BUN: 9 mg/dL (ref 6–20)
CO2: 25 mmol/L (ref 22–32)
Calcium: 9.4 mg/dL (ref 8.9–10.3)
Chloride: 104 mmol/L (ref 98–111)
Creatinine, Ser: 0.8 mg/dL (ref 0.44–1.00)
GFR, Estimated: >60 mL/min
Glucose, Bld: 118 mg/dL — ABNORMAL HIGH (ref 70–99)
Potassium: 3.9 mmol/L (ref 3.5–5.1)
Sodium: 138 mmol/L (ref 135–145)
Total Bilirubin: 0.3 mg/dL (ref 0.0–1.2)
Total Protein: 7.5 g/dL (ref 6.5–8.1)

## 2024-09-02 LAB — PREGNANCY, URINE: Preg Test, Ur: NEGATIVE

## 2024-09-02 LAB — URINALYSIS, ROUTINE W REFLEX MICROSCOPIC
Bilirubin Urine: NEGATIVE
Glucose, UA: NEGATIVE mg/dL
Hgb urine dipstick: NEGATIVE
Ketones, ur: NEGATIVE mg/dL
Leukocytes,Ua: NEGATIVE
Nitrite: NEGATIVE
Protein, ur: NEGATIVE mg/dL
Specific Gravity, Urine: 1.005 (ref 1.005–1.030)
pH: 6 (ref 5.0–8.0)

## 2024-09-02 MED ORDER — ACETAMINOPHEN 500 MG PO TABS
1000.0000 mg | ORAL_TABLET | Freq: Once | ORAL | Status: AC
  Administered 2024-09-02: 1000 mg via ORAL
  Filled 2024-09-02: qty 2

## 2024-09-02 MED ORDER — LIDOCAINE 5 % EX PTCH
1.0000 | MEDICATED_PATCH | CUTANEOUS | Status: DC
  Administered 2024-09-02: 1 via TRANSDERMAL
  Filled 2024-09-02: qty 1

## 2024-09-02 MED ORDER — IOHEXOL 300 MG/ML  SOLN
100.0000 mL | Freq: Once | INTRAMUSCULAR | Status: AC | PRN
  Administered 2024-09-02: 100 mL via INTRAVENOUS

## 2024-09-02 MED ORDER — OXYCODONE HCL 5 MG PO TABS: 5.0000 mg | ORAL_TABLET | Freq: Three times a day (TID) | ORAL | 0 refills | Status: AC | PRN

## 2024-09-02 MED ORDER — SODIUM CHLORIDE 0.9 % IV BOLUS
1000.0000 mL | Freq: Once | INTRAVENOUS | Status: AC
  Administered 2024-09-02: 1000 mL via INTRAVENOUS

## 2024-09-02 NOTE — ED Triage Notes (Signed)
 Pt c/o pain in pelvis wrapping around right flank x3 days. Pt c/o pain w/ urination, no blood in urine. Pt states pain is getting worse, no relief w/ heating pad. Pt denies V/D, mild nausea.

## 2024-09-02 NOTE — Discharge Instructions (Addendum)
 You can take Tylenol  1 g every 8 hours and ibuprofen  600 every 8 hours with food for the next 1 week to help with pain.  Use oxycodone  for breakthrough pain.  You can follow-up with outpatient with OB/GYN to discuss these findings.  It is possible that this could all just be from pelvic congestion syndrome versus having a ovarian cyst that ruptured.  If you develop any fevers, weakness, worsening pain or any other concerns she can return to the ER for repeat evaluation   IMPRESSION: 1. No acute abdominal/pelvic findings, mass lesions or adenopathy. 2. Small/moderate amount of free pelvic fluid may be physiologic. Ruptured ovarian cyst is also possible. 3. Dilated tortuous right gonadal veins suggesting pelvic congestion syndrome/pelvic venous insufficiency. 1. Small amount of free fluid in the cul de sac and right adnexa.  2. Corpus luteum cyst in the left ovary, with no follow-up imaging recommended.    Take oxycodone  as prescribed. Do not drink alcohol, drive or participate in any other potentially dangerous activities while taking this medication as it may make you sleepy. Do not take this medication with any other sedating medications, either prescription or over-the-counter. If you were prescribed Percocet or Vicodin, do not take these with acetaminophen  (Tylenol ) as it is already contained within these medications.  This medication is an opiate (or narcotic) pain medication and can be habit forming. Use it as little as possible to achieve adequate pain control. Do not use or use it with extreme caution if you have a history of opiate abuse or dependence. If you are on a pain contract with your primary care doctor or a pain specialist, be sure to let them know you were prescribed this medication today from the Emergency Department. This medication is intended for your use only - do not give any to anyone else and keep it in a secure place where nobody else, especially children, have access to  it.

## 2024-09-02 NOTE — ED Provider Notes (Signed)
 "  [facility] Provider Note    Event Date/Time   First MD Initiated Contact with Patient 09/02/24 1430     (approximate)   History   Flank Pain   HPI  Joyce Rice is a 39 y.o. female who comes in with 3 days of right sided pelvic pain wrapping around to her right back.  She denies any blood in her urine.  She denies any vomiting, diarrhea.  No chest pain, no shortness of breath.  No falls hitting her head.  She reports having prior colonoscopy that has been reassuring.  She does report having history of irritable bowel syndrome but states that this feels different.  She denies any new sexual partners, she reports being with just her husband.  Denies any vaginal discharge or concerns for STDs.  I reviewed a note from 02/16/2023 where patient was seen for GI Dr.  Patient had generalized abdominal pain that was on Bentyl  and being also treated for hypothyroidism.   Physical Exam   Triage Vital Signs: ED Triage Vitals [09/02/24 1406]  Encounter Vitals Group     BP (!) 128/93     Girls Systolic BP Percentile      Girls Diastolic BP Percentile      Boys Systolic BP Percentile      Boys Diastolic BP Percentile      Pulse Rate (!) 108     Resp 18     Temp 98.1 F (36.7 C)     Temp Source Oral     SpO2 97 %     Weight 171 lb (77.6 kg)     Height 5' 3 (1.6 m)     Head Circumference      Peak Flow      Pain Score 10     Pain Loc      Pain Education      Exclude from Growth Chart     Most recent vital signs: Vitals:   09/02/24 1406 09/02/24 1456  BP: (!) 128/93   Pulse: (!) 108   Resp: 18   Temp: 98.1 F (36.7 C)   SpO2: 97% 97%     General: Awake, no distress.  CV:  Good peripheral perfusion.  Slightly tachycardic Resp:  Normal effort.  Abd:  No distention.  Slight tenderness in the right lower abdomen without any rebound, guarding. Other:     ED Results / Procedures / Treatments   Labs (all labs ordered are listed, but only abnormal  results are displayed) Labs Reviewed  CBC WITH DIFFERENTIAL/PLATELET - Abnormal; Notable for the following components:      Result Value   Hemoglobin 11.9 (*)    HCT 35.6 (*)    All other components within normal limits  COMPREHENSIVE METABOLIC PANEL WITH GFR - Abnormal; Notable for the following components:   Glucose, Bld 118 (*)    All other components within normal limits  URINALYSIS, ROUTINE W REFLEX MICROSCOPIC - Abnormal; Notable for the following components:   Color, Urine STRAW (*)    APPearance CLEAR (*)    All other components within normal limits  PREGNANCY, URINE     RADIOLOGY I have reviewed the ct personally and interpreted no evidence of kidney stone   PROCEDURES:  Critical Care performed: No  Procedures   MEDICATIONS ORDERED IN ED: Medications  lidocaine  (LIDODERM ) 5 % 1 patch (1 patch Transdermal Patch Applied 09/02/24 1518)  acetaminophen  (TYLENOL ) tablet 1,000 mg (1,000 mg Oral Given 09/02/24 1517)  sodium chloride  0.9 % bolus 1,000 mL (1,000 mLs Intravenous New Bag/Given 09/02/24 1526)  iohexol  (OMNIPAQUE ) 300 MG/ML solution 100 mL (100 mLs Intravenous Contrast Given 09/02/24 1612)     IMPRESSION / MDM / ASSESSMENT AND PLAN / ED COURSE  I reviewed the triage vital signs and the nursing notes.   Patient's presentation is most consistent with acute presentation with potential threat to life or bodily function.   Patient comes in with some right lower pelvic, abdominal pain.  Did discuss with patient the possibility of STDs.  Patient is concerned about a HIPAA violation given the door was open when I was asking her these questions.  I immediately apologized that the door was open, stepped out of the room and saw that there was nobody in the hallway that could have heard and shut the door.  It does not sound like she has any concerns for this regardless as she reports being sexually active with her husband only.  Preg test was negative.  CBC reassuring slightly  low hemoglobin at 11.9.  CMP reassuring urine without evidence of UTI.  Given patient was driving I am going able to give some Tylenol , lidocaine  patch.  I also give 1 L of fluid given some slight tachycardia although she denies any chest pain, shortness of breath.  Did discuss with patient getting CT imaging to make sure there is no appendicitis, diverticulitis, kidney stone or other acute pathology.  IMPRESSION: 1. No acute abdominal/pelvic findings, mass lesions or adenopathy. 2. Small/moderate amount of free pelvic fluid may be physiologic. Ruptured ovarian cyst is also possible. 3. Dilated tortuous right gonadal veins suggesting pelvic congestion syndrome/pelvic venous insufficiency.  Pt denies any trauma to abdomen to suggest perf bowel.  Did discuss with patient the above results and need to follow-up with OB/GYN outpatient.  We did get an ultrasound to evaluate for any cyst.  1. Small amount of free fluid in the cul de sac and right adnexa.  2. Corpus luteum cyst in the left ovary, with no follow-up imaging recommended.    Discussed case with Dr. Verdon and it does not appear to be hemorrhagic fluid.  She will follow-up with patient outpatient.  Reevaluated patient.  She feels comfortable discharge home and follow-up outpatient.  We discussed Tylenol , ibuprofen  for pain and will prescribe a few oxycodone  for breakthrough pain as needed.     FINAL CLINICAL IMPRESSION(S) / ED DIAGNOSES   Final diagnoses:  Pelvic congestion syndrome     Rx / DC Orders   ED Discharge Orders     None        Note:  This document was prepared using Dragon voice recognition software and may include unintentional dictation errors.   Ernest Ronal BRAVO, MD 09/02/24 1929  "

## 2024-09-11 ENCOUNTER — Telehealth: Payer: Self-pay | Admitting: *Deleted

## 2024-09-11 ENCOUNTER — Other Ambulatory Visit: Payer: Self-pay | Admitting: Nurse Practitioner

## 2024-09-11 DIAGNOSIS — K580 Irritable bowel syndrome with diarrhea: Secondary | ICD-10-CM

## 2024-09-11 NOTE — Telephone Encounter (Signed)
 Copied from CRM #[id number]. Topic: Clinical - Medication Refill >> Sep 11, 2024  1:01 PM Joyce Rice wrote: Medication: rifaximin  (XIFAXAN ) 550 MG TABS tablet   Has the patient contacted their pharmacy? No (Agent: If no, request that the patient contact the pharmacy for the refill. If patient does not wish to contact the pharmacy document the reason why and proceed with request.) (Agent: If yes, when and what did the pharmacy advise?)  This is the patient's preferred pharmacy:  [id number] [place], [place] - [address] AT [facility] OF [address] [place] [address] Phone: [phone] Fax: [phone]    Is this the correct pharmacy for this prescription? Yes If no, delete pharmacy and type the correct one.   Has the prescription been filled recently? No  Is the patient out of the medication? Yes  Has the patient been seen for an appointment in the last year OR does the patient have an upcoming appointment? Yes  Can we respond through MyChart? Yes  Agent: Please be advised that Rx refills may take up to 3 business days. We ask that you follow-up with your pharmacy.    Is this okay to refill?

## 2024-09-12 NOTE — Telephone Encounter (Signed)
 Please call Joyce Rice and let her know that I sent a refill of this in for her. This is usually a 14 day treatment, but can be repeated if the symptoms come back. If she is still having issues after the repeat dosage, I recommend seeing GI to make sure that something else cannot be done to help control her symptoms.

## 2025-01-12 ENCOUNTER — Ambulatory Visit: Admitting: "Endocrinology

## 2025-06-21 ENCOUNTER — Encounter: Admitting: Nurse Practitioner
